# Patient Record
Sex: Male | Born: 1960
Health system: Southern US, Community
[De-identification: ages and names within clinical notes are randomized; demographics above are authoritative.]

## PROBLEM LIST (undated history)

## (undated) DIAGNOSIS — I10 Essential (primary) hypertension: Secondary | ICD-10-CM

## (undated) DIAGNOSIS — G4730 Sleep apnea, unspecified: Secondary | ICD-10-CM

## (undated) DIAGNOSIS — G709 Myoneural disorder, unspecified: Secondary | ICD-10-CM

## (undated) DIAGNOSIS — E669 Obesity, unspecified: Secondary | ICD-10-CM

## (undated) DIAGNOSIS — K5730 Diverticulosis of large intestine without perforation or abscess without bleeding: Secondary | ICD-10-CM

## (undated) DIAGNOSIS — K635 Polyp of colon: Secondary | ICD-10-CM

## (undated) DIAGNOSIS — I509 Heart failure, unspecified: Secondary | ICD-10-CM

## (undated) DIAGNOSIS — K259 Gastric ulcer, unspecified as acute or chronic, without hemorrhage or perforation: Secondary | ICD-10-CM

## (undated) DIAGNOSIS — E119 Type 2 diabetes mellitus without complications: Secondary | ICD-10-CM

## (undated) DIAGNOSIS — M1990 Unspecified osteoarthritis, unspecified site: Secondary | ICD-10-CM

## (undated) DIAGNOSIS — F988 Other specified behavioral and emotional disorders with onset usually occurring in childhood and adolescence: Secondary | ICD-10-CM

## (undated) DIAGNOSIS — E785 Hyperlipidemia, unspecified: Secondary | ICD-10-CM

## (undated) HISTORY — DX: Gastric ulcer, unspecified as acute or chronic, without hemorrhage or perforation: K25.9

## (undated) HISTORY — DX: Heart failure, unspecified: I50.9

## (undated) HISTORY — DX: Obesity, unspecified: E66.9

## (undated) HISTORY — DX: Other specified behavioral and emotional disorders with onset usually occurring in childhood and adolescence: F98.8

## (undated) HISTORY — DX: Myoneural disorder, unspecified: G70.9

## (undated) HISTORY — PX: UPPER GASTROINTESTINAL ENDOSCOPY: SHX188

## (undated) HISTORY — DX: Essential (primary) hypertension: I10

## (undated) HISTORY — DX: Hyperlipidemia, unspecified: E78.5

## (undated) HISTORY — PX: OTHER SURGICAL HISTORY: SHX169

## (undated) HISTORY — PX: COLONOSCOPY: SHX174

## (undated) HISTORY — DX: Type 2 diabetes mellitus without complications: E11.9

## (undated) HISTORY — DX: Sleep apnea, unspecified: G47.30

## (undated) HISTORY — DX: Unspecified osteoarthritis, unspecified site: M19.90

---
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
## 1989-06-10 DIAGNOSIS — K259 Gastric ulcer, unspecified as acute or chronic, without hemorrhage or perforation: Secondary | ICD-10-CM

## 1989-06-10 HISTORY — DX: Gastric ulcer, unspecified as acute or chronic, without hemorrhage or perforation: K25.9

## 1999-10-09 ENCOUNTER — Emergency Department (HOSPITAL_COMMUNITY): Admission: EM | Admit: 1999-10-09 | Discharge: 1999-10-09 | Payer: Self-pay | Admitting: Emergency Medicine

## 2002-11-24 ENCOUNTER — Ambulatory Visit (HOSPITAL_BASED_OUTPATIENT_CLINIC_OR_DEPARTMENT_OTHER): Admission: RE | Admit: 2002-11-24 | Discharge: 2002-11-24 | Payer: Self-pay | Admitting: Pulmonary Disease

## 2003-04-19 ENCOUNTER — Ambulatory Visit (HOSPITAL_COMMUNITY): Admission: RE | Admit: 2003-04-19 | Discharge: 2003-04-19 | Payer: Self-pay | Admitting: Gastroenterology

## 2004-06-20 ENCOUNTER — Ambulatory Visit: Payer: Self-pay | Admitting: Pulmonary Disease

## 2005-10-01 ENCOUNTER — Encounter: Admission: RE | Admit: 2005-10-01 | Discharge: 2005-10-01 | Payer: Self-pay | Admitting: Family Medicine

## 2005-10-01 ENCOUNTER — Ambulatory Visit: Payer: Self-pay | Admitting: Family Medicine

## 2005-10-03 ENCOUNTER — Ambulatory Visit: Payer: Self-pay | Admitting: Family Medicine

## 2005-10-07 ENCOUNTER — Ambulatory Visit: Payer: Self-pay | Admitting: Family Medicine

## 2005-12-30 ENCOUNTER — Encounter: Admission: RE | Admit: 2005-12-30 | Discharge: 2005-12-30 | Payer: Self-pay | Admitting: Ophthalmology

## 2006-01-02 ENCOUNTER — Ambulatory Visit: Payer: Self-pay | Admitting: Family Medicine

## 2006-01-09 ENCOUNTER — Ambulatory Visit: Payer: Self-pay | Admitting: Family Medicine

## 2006-01-27 ENCOUNTER — Ambulatory Visit (HOSPITAL_COMMUNITY): Admission: RE | Admit: 2006-01-27 | Discharge: 2006-01-27 | Payer: Self-pay | Admitting: Neurology

## 2006-09-29 ENCOUNTER — Ambulatory Visit: Payer: Self-pay | Admitting: Family Medicine

## 2006-10-01 ENCOUNTER — Ambulatory Visit: Payer: Self-pay | Admitting: Family Medicine

## 2006-10-06 ENCOUNTER — Ambulatory Visit: Payer: Self-pay | Admitting: Family Medicine

## 2007-01-12 ENCOUNTER — Ambulatory Visit: Payer: Self-pay | Admitting: Family Medicine

## 2007-01-12 LAB — CONVERTED CEMR LAB
AST: 25 units/L (ref 0–37)
Alkaline Phosphatase: 63 units/L (ref 39–117)
Basophils Relative: 0.4 % (ref 0.0–1.0)
Bilirubin Urine: NEGATIVE
Bilirubin, Direct: 0.1 mg/dL (ref 0.0–0.3)
CO2: 29 meq/L (ref 19–32)
Calcium: 9 mg/dL (ref 8.4–10.5)
Eosinophils Relative: 3.3 % (ref 0.0–5.0)
GFR calc non Af Amer: 96 mL/min
Glucose, Urine, Semiquant: NEGATIVE
LDL Cholesterol: 74 mg/dL (ref 0–99)
Lymphocytes Relative: 44.7 % (ref 12.0–46.0)
MCHC: 34 g/dL (ref 30.0–36.0)
MCV: 89.5 fL (ref 78.0–100.0)
Nitrite: NEGATIVE
Potassium: 3.9 meq/L (ref 3.5–5.1)
RBC: 5.24 M/uL (ref 4.22–5.81)
RDW: 14.3 % (ref 11.5–14.6)
Total Bilirubin: 0.8 mg/dL (ref 0.3–1.2)
Total Protein: 6.8 g/dL (ref 6.0–8.3)
Triglycerides: 73 mg/dL (ref 0–149)
Urobilinogen, UA: 0.2
VLDL: 15 mg/dL (ref 0–40)
pH: 5.5

## 2007-01-19 ENCOUNTER — Ambulatory Visit: Payer: Self-pay | Admitting: Family Medicine

## 2007-01-19 DIAGNOSIS — E66813 Obesity, class 3: Secondary | ICD-10-CM | POA: Insufficient documentation

## 2007-01-19 DIAGNOSIS — G35 Multiple sclerosis: Secondary | ICD-10-CM | POA: Insufficient documentation

## 2007-01-19 DIAGNOSIS — Z9189 Other specified personal risk factors, not elsewhere classified: Secondary | ICD-10-CM | POA: Insufficient documentation

## 2007-01-19 DIAGNOSIS — E669 Obesity, unspecified: Secondary | ICD-10-CM

## 2007-05-05 ENCOUNTER — Telehealth: Payer: Self-pay | Admitting: Family Medicine

## 2007-07-29 ENCOUNTER — Telehealth: Payer: Self-pay | Admitting: Family Medicine

## 2007-08-11 ENCOUNTER — Ambulatory Visit: Payer: Self-pay | Admitting: Family Medicine

## 2007-08-11 DIAGNOSIS — F988 Other specified behavioral and emotional disorders with onset usually occurring in childhood and adolescence: Secondary | ICD-10-CM | POA: Insufficient documentation

## 2007-08-11 DIAGNOSIS — IMO0002 Reserved for concepts with insufficient information to code with codable children: Secondary | ICD-10-CM

## 2007-08-11 LAB — CONVERTED CEMR LAB
Chloride: 105 meq/L (ref 96–112)
Creatinine, Ser: 1.1 mg/dL (ref 0.4–1.5)
GFR calc Af Amer: 92 mL/min
GFR calc non Af Amer: 76 mL/min
Glucose, Bld: 131 mg/dL — ABNORMAL HIGH (ref 70–99)
Microalb Creat Ratio: 12.2 mg/g (ref 0.0–30.0)
Potassium: 4 meq/L (ref 3.5–5.1)

## 2007-08-17 ENCOUNTER — Telehealth: Payer: Self-pay | Admitting: Family Medicine

## 2007-11-12 ENCOUNTER — Ambulatory Visit: Payer: Self-pay | Admitting: Family Medicine

## 2008-02-12 ENCOUNTER — Ambulatory Visit: Payer: Self-pay | Admitting: Family Medicine

## 2008-08-22 ENCOUNTER — Ambulatory Visit: Payer: Self-pay | Admitting: Family Medicine

## 2008-08-26 ENCOUNTER — Telehealth: Payer: Self-pay | Admitting: *Deleted

## 2008-08-26 LAB — CONVERTED CEMR LAB
Creatinine, Ser: 0.9 mg/dL (ref 0.4–1.5)
GFR calc non Af Amer: 95 mL/min
Glucose, Bld: 148 mg/dL — ABNORMAL HIGH (ref 70–99)
Sodium: 141 meq/L (ref 135–145)

## 2008-11-28 ENCOUNTER — Ambulatory Visit: Payer: Self-pay | Admitting: Family Medicine

## 2008-11-28 LAB — CONVERTED CEMR LAB
Blood Glucose, Fasting: 133 mg/dL
Hgb A1c MFr Bld: 6.7 % — ABNORMAL HIGH (ref 4.6–6.5)

## 2008-12-05 ENCOUNTER — Ambulatory Visit: Payer: Self-pay | Admitting: Family Medicine

## 2008-12-05 DIAGNOSIS — N529 Male erectile dysfunction, unspecified: Secondary | ICD-10-CM | POA: Insufficient documentation

## 2008-12-07 LAB — CONVERTED CEMR LAB: Testosterone: 355.72 ng/dL (ref 350.00–890.00)

## 2008-12-26 ENCOUNTER — Telehealth: Payer: Self-pay | Admitting: Family Medicine

## 2009-02-08 ENCOUNTER — Ambulatory Visit: Payer: Self-pay | Admitting: Family Medicine

## 2009-02-08 LAB — CONVERTED CEMR LAB
AST: 27 units/L (ref 0–37)
Alkaline Phosphatase: 61 units/L (ref 39–117)
Basophils Absolute: 0 10*3/uL (ref 0.0–0.1)
Bilirubin, Direct: 0.1 mg/dL (ref 0.0–0.3)
Blood in Urine, dipstick: NEGATIVE
CO2: 32 meq/L (ref 19–32)
Eosinophils Relative: 2.6 % (ref 0.0–5.0)
Glucose, Bld: 127 mg/dL — ABNORMAL HIGH (ref 70–99)
Glucose, Urine, Semiquant: NEGATIVE
HDL: 38.9 mg/dL — ABNORMAL LOW (ref >39.00)
Hemoglobin: 16.3 g/dL (ref 13.0–17.0)
Ketones, urine, test strip: NEGATIVE
Lymphocytes Relative: 41 % (ref 12.0–46.0)
MCV: 91.1 fL (ref 78.0–100.0)
Monocytes Absolute: 0.6 10*3/uL (ref 0.1–1.0)
Neutro Abs: 3.1 10*3/uL (ref 1.4–7.7)
Neutrophils Relative %: 46.9 % (ref 43.0–77.0)
Potassium: 4.1 meq/L (ref 3.5–5.1)
RDW: 14 % (ref 11.5–14.6)
Sodium: 142 meq/L (ref 135–145)
Specific Gravity, Urine: 1.025
TSH: 0.82 microintl units/mL (ref 0.35–5.50)
Total Bilirubin: 0.8 mg/dL (ref 0.3–1.2)
Total CHOL/HDL Ratio: 3
Total Protein: 7.3 g/dL (ref 6.0–8.3)
Triglycerides: 56 mg/dL (ref 0.0–149.0)
Urobilinogen, UA: 1
VLDL: 11.2 mg/dL (ref 0.0–40.0)

## 2009-02-21 ENCOUNTER — Ambulatory Visit: Payer: Self-pay | Admitting: Family Medicine

## 2009-03-03 ENCOUNTER — Telehealth: Payer: Self-pay | Admitting: Family Medicine

## 2009-05-24 ENCOUNTER — Ambulatory Visit: Payer: Self-pay | Admitting: Family Medicine

## 2009-08-15 ENCOUNTER — Ambulatory Visit: Payer: Self-pay | Admitting: Family Medicine

## 2009-08-15 LAB — CONVERTED CEMR LAB
Calcium: 9.1 mg/dL (ref 8.4–10.5)
Chloride: 106 meq/L (ref 96–112)
Creatinine, Ser: 1.1 mg/dL (ref 0.4–1.5)
Glucose, Bld: 123 mg/dL — ABNORMAL HIGH (ref 70–99)
Potassium: 4 meq/L (ref 3.5–5.1)

## 2009-08-22 ENCOUNTER — Ambulatory Visit: Payer: Self-pay | Admitting: Family Medicine

## 2009-12-01 ENCOUNTER — Ambulatory Visit: Payer: Self-pay | Admitting: Family Medicine

## 2009-12-01 DIAGNOSIS — G8929 Other chronic pain: Secondary | ICD-10-CM | POA: Insufficient documentation

## 2009-12-01 DIAGNOSIS — M25552 Pain in left hip: Secondary | ICD-10-CM | POA: Insufficient documentation

## 2009-12-01 DIAGNOSIS — M25559 Pain in unspecified hip: Secondary | ICD-10-CM | POA: Insufficient documentation

## 2009-12-03 ENCOUNTER — Ambulatory Visit (HOSPITAL_COMMUNITY): Admission: RE | Admit: 2009-12-03 | Discharge: 2009-12-03 | Payer: Self-pay | Admitting: Family Medicine

## 2009-12-25 ENCOUNTER — Telehealth: Payer: Self-pay | Admitting: Family Medicine

## 2010-02-13 ENCOUNTER — Ambulatory Visit: Payer: Self-pay | Admitting: Family Medicine

## 2010-02-13 LAB — CONVERTED CEMR LAB
ALT: 24 units/L (ref 0–53)
Albumin: 4 g/dL (ref 3.5–5.2)
BUN: 15 mg/dL (ref 6–23)
Bilirubin, Direct: 0.1 mg/dL (ref 0.0–0.3)
CO2: 31 meq/L (ref 19–32)
Calcium: 9.1 mg/dL (ref 8.4–10.5)
Cholesterol: 131 mg/dL (ref 0–200)
Creatinine,U: 193 mg/dL
GFR calc non Af Amer: 108.98 mL/min (ref >60)
HCT: 46.9 % (ref 39.0–52.0)
HDL: 42.9 mg/dL (ref >39.00)
Hemoglobin: 16 g/dL (ref 13.0–17.0)
Hgb A1c MFr Bld: 6.7 % — ABNORMAL HIGH (ref 4.6–6.5)
Lymphs Abs: 2.6 10*3/uL (ref 0.7–4.0)
MCHC: 34 g/dL (ref 30.0–36.0)
Microalb Creat Ratio: 2.7 mg/g (ref 0.0–30.0)
Microalb, Ur: 5.2 mg/dL — ABNORMAL HIGH (ref 0.0–1.9)
Monocytes Absolute: 0.5 10*3/uL (ref 0.1–1.0)
Neutro Abs: 3.2 10*3/uL (ref 1.4–7.7)
Platelets: 201 10*3/uL (ref 150.0–400.0)
RBC: 5.16 M/uL (ref 4.22–5.81)
Total CHOL/HDL Ratio: 3
Total Protein: 7 g/dL (ref 6.0–8.3)
Triglycerides: 59 mg/dL (ref 0.0–149.0)
VLDL: 11.8 mg/dL (ref 0.0–40.0)

## 2010-02-20 ENCOUNTER — Ambulatory Visit: Payer: Self-pay | Admitting: Family Medicine

## 2010-02-20 DIAGNOSIS — K921 Melena: Secondary | ICD-10-CM

## 2010-05-11 ENCOUNTER — Telehealth: Payer: Self-pay | Admitting: Family Medicine

## 2010-05-24 ENCOUNTER — Telehealth: Payer: Self-pay | Admitting: Family Medicine

## 2010-06-08 ENCOUNTER — Encounter: Payer: Self-pay | Admitting: Family Medicine

## 2010-07-08 LAB — CONVERTED CEMR LAB
ALT: 25 units/L (ref 0–53)
Albumin: 3.9 g/dL (ref 3.5–5.2)
Alkaline Phosphatase: 59 units/L (ref 39–117)
Basophils Absolute: 0 10*3/uL (ref 0.0–0.1)
Basophils Relative: 0.9 % (ref 0.0–3.0)
Chloride: 104 meq/L (ref 96–112)
Creatinine, Ser: 1 mg/dL (ref 0.4–1.5)
Creatinine,U: 233.7 mg/dL
Creatinine,U: 282.1 mg/dL
Eosinophils Absolute: 0.1 10*3/uL (ref 0.0–0.7)
Glucose, Bld: 128 mg/dL — ABNORMAL HIGH (ref 70–99)
HDL: 37.8 mg/dL — ABNORMAL LOW (ref >39.0)
Hgb A1c MFr Bld: 6.5 % (ref 4.6–6.5)
Ketones, urine, test strip: NEGATIVE
Lymphocytes Relative: 38.1 % (ref 12.0–46.0)
MCHC: 34.8 g/dL (ref 30.0–36.0)
Microalb Creat Ratio: 5.7 mg/g (ref 0.0–30.0)
Microalb Creat Ratio: 8.6 mg/g (ref 0.0–30.0)
Monocytes Absolute: 0.4 10*3/uL (ref 0.1–1.0)
Monocytes Relative: 8.3 % (ref 3.0–12.0)
Nitrite: NEGATIVE
PSA: 0.7 ng/mL (ref 0.10–4.00)
Potassium: 4 meq/L (ref 3.5–5.1)
Sodium: 140 meq/L (ref 135–145)
TSH: 0.75 microintl units/mL (ref 0.35–5.50)
Total Bilirubin: 1 mg/dL (ref 0.3–1.2)
Total Protein: 7.3 g/dL (ref 6.0–8.3)
Triglycerides: 57 mg/dL (ref 0–149)
Urobilinogen, UA: 1
VLDL: 11 mg/dL (ref 0–40)
WBC: 5.4 10*3/uL (ref 4.5–10.5)
pH: 7
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
## 2010-07-12 NOTE — Letter (Signed)
Summary: Referral - not able to see patient  [facility]  [address] [place], [place] [address]   Phone: [phone]  Fax: [phone]    June 08, 2010    Tinnie Gens A. Tawanna Cooler, M.D. [address] [place], [place] [address]   Re:   AMARE BAIL DOB:  April 11, 1961 MRN:   [id number]    Dear Dr. Tawanna Cooler:  Thank you for your kind referral of the above patient.  We have attempted to schedule the recommended procedure Screening Colonoscopy but have not been able to schedule because:   X  The patient was not available by phone and/or has not returned our calls.  ___ The patient declined to schedule the procedure at this time.  We appreciate the referral and hope that we will have the opportunity to treat this patient in the future.    Sincerely,    [facility] [phone]
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
## 2010-07-12 NOTE — Progress Notes (Signed)
Summary: Adderall request  Phone Note Call from Patient Call back at [phone] [facility] cell   Caller: Spouse - [name] Call For: Brent Pee MD Summary of Call: VM from wife requesting refill Adderall 25mg  XR.  Left a message for pt he does not have this med on his active med list.  Ritalin 20mg  is.  Requested he call back for clarification of request.  Message included Dr Tawanna Cooler out of office until Monday 12/7.  Ritalin last filled 9/13/1, number 200, pt taking two times a day  Initial call taken by: Sid Falcon LPN,  May 11, 2010 2:58 PM  Follow-up for Phone Call        Pt called back, states Dr Tawanna Cooler told him he could try an extended release at any time, would have to chg to Adderall.  Pt is going to call his pharmacy to see if they can fill Adderall, available has been a problem, he will call back Monday Follow-up by: Sid Falcon LPN,  May 11, 2010 3:32 PM  Additional Follow-up for Phone Call Additional follow up Details #1::        Adderall 25 mg long-acting, dispense 30, directions one q.a.m., refills x 2 Additional Follow-up by: Brent Pee MD,  May 14, 2010 8:49 AM    Additional Follow-up for Phone Call Additional follow up Details #2::    Pt is req an XR for Ritalin or Adderall. Pts spouse would like Fleet Contras to call back to discuss.  Follow-up by: Lucy Antigua,  May 15, 2010 1:25 PM  New/Updated Medications: ADDERALL XR 25 MG XR24H-CAP (AMPHETAMINE-DEXTROAMPHETAMINE) take one cap  once daily   fill now ADDERALL XR 25 MG XR24H-CAP (AMPHETAMINE-DEXTROAMPHETAMINE) take one cap once daily   fill in one month ADDERALL XR 25 MG XR24H-CAP (AMPHETAMINE-DEXTROAMPHETAMINE) take one cap once daily   fill in two months Prescriptions: ADDERALL XR 25 MG XR24H-CAP (AMPHETAMINE-DEXTROAMPHETAMINE) take one cap once daily   fill in two months  #30 x 0   Entered by:   Kern Reap CMA (AAMA)   Authorized by:   Brent Pee MD   Signed by:   Kern Reap  CMA (AAMA) on 05/15/2010   Method used:   Print then Give to Patient   RxID:   [id number] ADDERALL XR 25 MG XR24H-CAP (AMPHETAMINE-DEXTROAMPHETAMINE) take one cap once daily   fill in one month  #30 x 0   Entered by:   Kern Reap CMA (AAMA)   Authorized by:   Brent Pee MD   Signed by:   Kern Reap CMA (AAMA) on 05/15/2010   Method used:   Print then Give to Patient   RxID:   [id number] ADDERALL XR 25 MG XR24H-CAP (AMPHETAMINE-DEXTROAMPHETAMINE) take one cap  once daily   fill now  #30 x 0   Entered by:   Kern Reap CMA (AAMA)   Authorized by:   Brent Pee MD   Signed by:   Kern Reap CMA (AAMA) on 05/15/2010   Method used:   Print then Give to Patient   RxID:   [id number]

## 2010-07-12 NOTE — Progress Notes (Signed)
Summary: Pt req script for Ritalin  Phone Note Refill Request Call back at Work Phone [phone] Message from:  Patient on December 25, 2009 11:11 AM  Refills Requested: Medication #1:  RITALIN 20 MG  TABS Take 1 tablet by mouth two times a day   Dosage confirmed as above?Dosage Confirmed   Supply Requested: 3 months  Method Requested: Pick up at Office Initial call taken by: Lucy Antigua,  December 25, 2009 11:11 AM  Follow-up for Phone Call        rx ready to pick up Follow-up by: Kern Reap CMA Duncan Dull),  December 26, 2009 11:13 AM    Prescriptions: RITALIN 20 MG  TABS (METHYLPHENIDATE HCL) Take 1 tablet by mouth two times a day  #200 x 0   Entered by:   Kern Reap CMA (AAMA)   Authorized by:   Roderick Pee MD   Signed by:   Kern Reap CMA (AAMA) on 12/25/2009   Method used:   Print then Give to Patient   RxID:   [id number]

## 2010-08-14 ENCOUNTER — Other Ambulatory Visit (INDEPENDENT_AMBULATORY_CARE_PROVIDER_SITE_OTHER): Payer: 59

## 2010-08-14 DIAGNOSIS — E119 Type 2 diabetes mellitus without complications: Secondary | ICD-10-CM

## 2010-08-14 DIAGNOSIS — I10 Essential (primary) hypertension: Secondary | ICD-10-CM

## 2010-08-14 LAB — BASIC METABOLIC PANEL
CO2: 29 mEq/L (ref 19–32)
Calcium: 9.4 mg/dL (ref 8.4–10.5)
Chloride: 104 mEq/L (ref 96–112)
Glucose, Bld: 117 mg/dL — ABNORMAL HIGH (ref 70–99)
Potassium: 4.3 mEq/L (ref 3.5–5.1)
Sodium: 142 mEq/L (ref 135–145)

## 2010-08-14 LAB — HEMOGLOBIN A1C: Hgb A1c MFr Bld: 6.6 % — ABNORMAL HIGH (ref 4.6–6.5)

## 2010-08-21 ENCOUNTER — Encounter: Payer: Self-pay | Admitting: Family Medicine

## 2010-08-21 ENCOUNTER — Other Ambulatory Visit: Payer: Self-pay | Admitting: Family Medicine

## 2010-08-21 ENCOUNTER — Ambulatory Visit (INDEPENDENT_AMBULATORY_CARE_PROVIDER_SITE_OTHER): Payer: 59 | Admitting: Family Medicine

## 2010-08-21 DIAGNOSIS — E669 Obesity, unspecified: Secondary | ICD-10-CM

## 2010-08-21 DIAGNOSIS — E119 Type 2 diabetes mellitus without complications: Secondary | ICD-10-CM

## 2010-08-21 DIAGNOSIS — F988 Other specified behavioral and emotional disorders with onset usually occurring in childhood and adolescence: Secondary | ICD-10-CM

## 2010-08-21 DIAGNOSIS — Z1211 Encounter for screening for malignant neoplasm of colon: Secondary | ICD-10-CM

## 2010-08-21 DIAGNOSIS — IMO0002 Reserved for concepts with insufficient information to code with codable children: Secondary | ICD-10-CM

## 2010-08-21 DIAGNOSIS — K625 Hemorrhage of anus and rectum: Secondary | ICD-10-CM

## 2010-08-21 MED ORDER — AMPHETAMINE-DEXTROAMPHET ER 30 MG PO CP24: 30.0000 mg | ORAL_CAPSULE | ORAL | Status: DC

## 2010-08-21 NOTE — Patient Instructions (Addendum)
Continue current medication except increase the Adderall to 30 mg daily.  Follow-up in September for your annual exam.  Remember labs one week prior.  Call the orthopedist for evaluation of your left elbow  At the end of the visit.  He told me he never went to GI for colonoscopy.  He was having recurrent rectal bleeding.  Advised to go back to GI ASAP for evaluation.  Request put in

## 2010-08-21 NOTE — Progress Notes (Signed)
  Subjective:    Patient ID: Brent Wallace, male    DOB: 18-Feb-1961, 51 y.o.   MRN: [id number]  HPI Brent Wallace is a 50 year old male, who comes in today for multiple issues.  He is a history of underlying diabetes, for which he takes metformin 500 mg b.i.d., blood sugar 117.  A1c6 .6%.  His history of obesity.  He's lost 20 pounds in the past 6 months.  He is down to 332 pounds.  His history of ADHD for which he takes Adderall long-acting 25 mg daily.  He, thinks it's been less effective would like to increase the dose.  He said pain and stiffness in his left arm for 3 months.  No history of trauma referred or so   Review of Systems    Other than above, negative Objective:   Physical Exam Well-developed well-nourished, male in no acute distress.  Examination of the left arm shows a 10 degree contracture left elbow       Assessment & Plan:  Diabetes under good control continue above therapy.  Obesity.  Continue weight loss.  ADHD.  Increase Adderall to 30 mg long-acting.  A stiffness, left elbow referred to or for

## 2010-11-06 ENCOUNTER — Encounter: Payer: Self-pay | Admitting: Gastroenterology

## 2011-01-02 ENCOUNTER — Telehealth: Payer: Self-pay | Admitting: Family Medicine

## 2011-01-02 NOTE — Telephone Encounter (Signed)
Refill adderall xr  

## 2011-01-03 MED ORDER — AMPHETAMINE-DEXTROAMPHET ER 25 MG PO CP24: 25.0000 mg | ORAL_CAPSULE | ORAL | Status: DC

## 2011-02-20 ENCOUNTER — Other Ambulatory Visit (INDEPENDENT_AMBULATORY_CARE_PROVIDER_SITE_OTHER): Payer: Commercial Managed Care - PPO

## 2011-02-20 DIAGNOSIS — E119 Type 2 diabetes mellitus without complications: Secondary | ICD-10-CM

## 2011-02-20 DIAGNOSIS — F988 Other specified behavioral and emotional disorders with onset usually occurring in childhood and adolescence: Secondary | ICD-10-CM

## 2011-02-20 DIAGNOSIS — E669 Obesity, unspecified: Secondary | ICD-10-CM

## 2011-02-20 DIAGNOSIS — IMO0002 Reserved for concepts with insufficient information to code with codable children: Secondary | ICD-10-CM

## 2011-02-20 LAB — CBC WITH DIFFERENTIAL/PLATELET
Basophils Relative: 0.9 % (ref 0.0–3.0)
Eosinophils Absolute: 0.3 10*3/uL (ref 0.0–0.7)
Lymphocytes Relative: 39.6 % (ref 12.0–46.0)
MCHC: 32.9 g/dL (ref 30.0–36.0)
Neutrophils Relative %: 46.4 % (ref 43.0–77.0)
RBC: 5.27 Mil/uL (ref 4.22–5.81)
WBC: 7.5 10*3/uL (ref 4.5–10.5)

## 2011-02-20 LAB — LIPID PANEL
HDL: 47.9 mg/dL (ref >39.00)
LDL Cholesterol: 74 mg/dL (ref 0–99)
Total CHOL/HDL Ratio: 3
VLDL: 11.4 mg/dL (ref 0.0–40.0)

## 2011-02-20 LAB — POCT URINALYSIS DIPSTICK
Bilirubin, UA: NEGATIVE
Leukocytes, UA: NEGATIVE
Nitrite, UA: NEGATIVE
Urobilinogen, UA: 2
pH, UA: 5.5

## 2011-02-20 LAB — HEPATIC FUNCTION PANEL
Alkaline Phosphatase: 76 U/L (ref 39–117)
Bilirubin, Direct: 0.1 mg/dL (ref 0.0–0.3)
Total Protein: 6.8 g/dL (ref 6.0–8.3)

## 2011-02-20 LAB — BASIC METABOLIC PANEL
Calcium: 9 mg/dL (ref 8.4–10.5)
Creatinine, Ser: 0.9 mg/dL (ref 0.4–1.5)

## 2011-02-20 LAB — HEMOGLOBIN A1C: Hgb A1c MFr Bld: 6.4 % (ref 4.6–6.5)

## 2011-02-27 ENCOUNTER — Encounter: Payer: Self-pay | Admitting: Gastroenterology

## 2011-02-27 ENCOUNTER — Ambulatory Visit (INDEPENDENT_AMBULATORY_CARE_PROVIDER_SITE_OTHER): Payer: Commercial Managed Care - PPO | Admitting: Family Medicine

## 2011-02-27 ENCOUNTER — Encounter: Payer: Self-pay | Admitting: Family Medicine

## 2011-02-27 DIAGNOSIS — E669 Obesity, unspecified: Secondary | ICD-10-CM

## 2011-02-27 DIAGNOSIS — E119 Type 2 diabetes mellitus without complications: Secondary | ICD-10-CM

## 2011-02-27 DIAGNOSIS — N529 Male erectile dysfunction, unspecified: Secondary | ICD-10-CM

## 2011-02-27 DIAGNOSIS — Z23 Encounter for immunization: Secondary | ICD-10-CM

## 2011-02-27 DIAGNOSIS — F988 Other specified behavioral and emotional disorders with onset usually occurring in childhood and adolescence: Secondary | ICD-10-CM

## 2011-02-27 DIAGNOSIS — IMO0002 Reserved for concepts with insufficient information to code with codable children: Secondary | ICD-10-CM

## 2011-02-27 DIAGNOSIS — Z0000 Encounter for general adult medical examination without abnormal findings: Secondary | ICD-10-CM

## 2011-02-27 DIAGNOSIS — K921 Melena: Secondary | ICD-10-CM

## 2011-02-27 DIAGNOSIS — G35 Multiple sclerosis: Secondary | ICD-10-CM

## 2011-02-27 MED ORDER — LISINOPRIL-HYDROCHLOROTHIAZIDE 10-12.5 MG PO TABS: 1.0000 | ORAL_TABLET | Freq: Every day | ORAL | Status: DC

## 2011-02-27 MED ORDER — SILDENAFIL CITRATE 50 MG PO TABS: 50.0000 mg | ORAL_TABLET | ORAL | Status: DC

## 2011-02-27 MED ORDER — METFORMIN HCL 500 MG PO TABS: 500.0000 mg | ORAL_TABLET | Freq: Two times a day (BID) | ORAL | Status: DC

## 2011-02-27 NOTE — Progress Notes (Signed)
Subjective:    Patient ID: Brent Wallace, male    DOB: 07-18-1960, 50 y.o.   MRN: [id number]  Hypertension  Diabetes   Brent Wallace Is a 50 year old, married male, nonsmoker....except 3 weeks......... comes in today for annual physical examination because of a history of adult ADD, hypertension, diabetes, erectile dysfunction, blood in his stools, mild MS, and a new problem of late cramps.  He takes long-acting Adderall 25 mg daily for adult ADD.  He takes Zestoretic 10 Desyrel .5 daily for hypertension.  Has not taken his medication in two days.  When he takes a daily his blood pressures at goal 130/80 or less.  He takes metformin 500 mg b.i.d. Fasting blood sugar 110.  A1c6 .4%.  He continues to struggle with his weight is over 350 pounds. ree weeks ago.  He quit smoking completely.hT He has erectile dysfunction, which we treat with fiber 50 mg p.r.n.  He had a history of blood in the stools.  We refer him to GI.  His mother got sick and he never went.  Again, will refer back to GI.  He began having right and left upper thigh cramping at the end of the day.  His wife is a PA and sent in to see an orthopedist.  They did an evaluation, which occluded.  An MRI.  They told him he had some bulging disks.  Put him on Cymbalta 60 mg a day indomethacin 50 mg b.i.d. And his discomfort.  Has gone away.  I question if it is indeed a disk problem or if some neuropathy from his diabetes.  The way describes it sounds like more of her neuropathy.  He gets routine eye care, dental care, colonoscopy is due as noted above.  Tetanus 2007 seasonal flu shot today   Review of Systems  Constitutional: Negative.   HENT: Negative.   Eyes: Negative.   Respiratory: Negative.   Cardiovascular: Negative.   Gastrointestinal: Negative.   Genitourinary: Negative.   Musculoskeletal: Negative.   Skin: Negative.   Neurological: Negative.   Hematological: Negative.   Psychiatric/Behavioral: Negative.          Objective:   Physical Exam  Constitutional: He is oriented to person, place, and time. He appears well-developed and well-nourished.       obese  HENT:  Head: Normocephalic and atraumatic.  Right Ear: External ear normal.  Left Ear: External ear normal.  Nose: Nose normal.  Mouth/Throat: Oropharynx is clear and moist.  Eyes: Conjunctivae and EOM are normal. Pupils are equal, round, and reactive to light.  Neck: Normal range of motion. Neck supple. No JVD present. No tracheal deviation present. No thyromegaly present.  Cardiovascular: Normal rate, regular rhythm, normal heart sounds and intact distal pulses.  Exam reveals no gallop and no friction rub.   No murmur heard. Pulmonary/Chest: Effort normal and breath sounds normal. No stridor. No respiratory distress. He has no wheezes. He has no rales. He exhibits no tenderness.  Abdominal: Soft. Bowel sounds are normal. He exhibits no distension and no mass. There is no tenderness. There is no rebound and no guarding.  Genitourinary: Rectum normal, prostate normal and penis normal. Guaiac negative stool. No penile tenderness.  Musculoskeletal: Normal range of motion. He exhibits no edema and no tenderness.  Lymphadenopathy:    He has no cervical adenopathy.  Neurological: He is alert and oriented to person, place, and time. He has normal reflexes. No cranial nerve deficit. He exhibits normal muscle tone.  Skin: Skin is  warm and dry. No rash noted. No erythema. No pallor.  Psychiatric: He has a normal mood and affect. His behavior is normal. Judgment and thought content normal.          Assessment & Plan:  Diabetes type 2, under good control continue metformin 500 mg b.i.d., add a baby aspirin.  ADD continue Adderall 25 mg long-acting daily.  Hypertension.  Continue lisinopr  10.  Desyrel .5 daily.  Erectile dysfunction continue Viagra 50 mg daily.  Next smoker x 3 weeks encouraged not to start again.  History of right and left  upper thigh pain, probable diabetic neuropathy.  Continue Cymbalta 60 mg daily.  History of intermittent bright red direct of bleeding guaiac-negative.  Today.  However, I still think he needs a colonoscopy.  We set him up for one and he never went.  He says this time.  He will get it done.  Obesity.  Again encouraged on exercise and weight loss.  Follow-up for his diabetes in 6 months

## 2011-02-27 NOTE — Patient Instructions (Signed)
Continue your current medications  Continue to avoid nicotine.  Add a baby aspirin daily.  Somebody will call you from GI to get to set up for the colonoscopy.  Follow-up in 6 months fasting labs one week prior.  Remember to walk 30 minutes daily.

## 2011-03-12 ENCOUNTER — Other Ambulatory Visit: Payer: Commercial Managed Care - PPO | Admitting: Gastroenterology

## 2011-03-18 ENCOUNTER — Ambulatory Visit (AMBULATORY_SURGERY_CENTER): Payer: Commercial Managed Care - PPO | Admitting: *Deleted

## 2011-03-18 ENCOUNTER — Encounter: Payer: Self-pay | Admitting: Gastroenterology

## 2011-03-18 VITALS — Ht 69.0 in | Wt 261.0 lb

## 2011-03-18 DIAGNOSIS — Z1211 Encounter for screening for malignant neoplasm of colon: Secondary | ICD-10-CM

## 2011-03-18 NOTE — Progress Notes (Signed)
Pt. Scheduled for NP office visit with Dr Christella Hartigan 04/16/11 due to weight limit.  He's 361 lb. And needs to be done at [facility] most likely under propofol.  Pt. Will bring a list of dates he'll be able to have transportation.  He's never been seen by our practice and is a diabetic, on Ritalin, Adderall,  Cymbalta, and BP med.   He was here for a colon screening and has never been seen by our practice.   Wyona Almas

## 2011-04-01 ENCOUNTER — Other Ambulatory Visit: Payer: Commercial Managed Care - PPO | Admitting: Gastroenterology

## 2011-04-16 ENCOUNTER — Encounter: Payer: Self-pay | Admitting: Gastroenterology

## 2011-04-16 ENCOUNTER — Ambulatory Visit (INDEPENDENT_AMBULATORY_CARE_PROVIDER_SITE_OTHER): Payer: Commercial Managed Care - PPO | Admitting: Gastroenterology

## 2011-04-16 VITALS — BP 136/74 | HR 88 | Ht 70.0 in | Wt 364.0 lb

## 2011-04-16 DIAGNOSIS — Z1211 Encounter for screening for malignant neoplasm of colon: Secondary | ICD-10-CM

## 2011-04-16 DIAGNOSIS — E669 Obesity, unspecified: Secondary | ICD-10-CM

## 2011-04-16 NOTE — Patient Instructions (Signed)
You will be set up for a colonoscopy at [facility] with propofol for sedation. We will set up dietician referral to help with general weight loss strategies.
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
## 2011-05-07 ENCOUNTER — Telehealth: Payer: Self-pay | Admitting: *Deleted

## 2011-05-07 NOTE — Telephone Encounter (Signed)
Called pt to ask if he could switch his times on 05/23/11 procedure. Pt needs to change the appt anyway, he's texting his wife for the dates.

## 2011-05-07 NOTE — Telephone Encounter (Signed)
Pt called back to r/s his [facility] appt, but everyday he gave me was not a Thursday. Pt will check with his wife about a January appt.

## 2011-05-11 DIAGNOSIS — K635 Polyp of colon: Secondary | ICD-10-CM

## 2011-05-11 DIAGNOSIS — K5730 Diverticulosis of large intestine without perforation or abscess without bleeding: Secondary | ICD-10-CM

## 2011-05-11 HISTORY — DX: Polyp of colon: K63.5

## 2011-05-11 HISTORY — DX: Diverticulosis of large intestine without perforation or abscess without bleeding: K57.30

## 2011-05-14 ENCOUNTER — Telehealth: Payer: Self-pay | Admitting: Family Medicine

## 2011-05-14 NOTE — Telephone Encounter (Signed)
Refill Adderall. Thanks. °

## 2011-05-15 MED ORDER — AMPHETAMINE-DEXTROAMPHET ER 25 MG PO CP24: 25.0000 mg | ORAL_CAPSULE | ORAL | Status: DC

## 2011-05-15 NOTE — Telephone Encounter (Signed)
rx ready for pick up and patient is aware  

## 2011-05-21 ENCOUNTER — Other Ambulatory Visit: Payer: Self-pay

## 2011-05-21 MED ORDER — PEG-KCL-NACL-NASULF-NA ASC-C 100 G PO SOLR: 1.0000 | ORAL | Status: DC

## 2011-05-22 ENCOUNTER — Encounter (HOSPITAL_COMMUNITY): Payer: Self-pay | Admitting: *Deleted

## 2011-05-23 ENCOUNTER — Encounter (HOSPITAL_COMMUNITY): Payer: Self-pay | Admitting: Anesthesiology

## 2011-05-23 ENCOUNTER — Encounter (HOSPITAL_COMMUNITY)
Admission: RE | Disposition: A | Discharge status: Home / Self Care | Payer: Self-pay | Source: Ambulatory Visit | Attending: Gastroenterology

## 2011-05-23 ENCOUNTER — Other Ambulatory Visit: Payer: Self-pay | Admitting: Gastroenterology

## 2011-05-23 ENCOUNTER — Ambulatory Visit (HOSPITAL_COMMUNITY): Payer: Commercial Managed Care - PPO | Admitting: Anesthesiology

## 2011-05-23 ENCOUNTER — Encounter (HOSPITAL_COMMUNITY): Payer: Self-pay | Admitting: *Deleted

## 2011-05-23 ENCOUNTER — Ambulatory Visit (HOSPITAL_COMMUNITY)
Admission: RE | Admit: 2011-05-23 | Discharge: 2011-05-23 | Disposition: A | Discharge status: Home / Self Care | Payer: Commercial Managed Care - PPO | Source: Ambulatory Visit | Attending: Gastroenterology | Admitting: Gastroenterology

## 2011-05-23 DIAGNOSIS — G4730 Sleep apnea, unspecified: Secondary | ICD-10-CM | POA: Insufficient documentation

## 2011-05-23 DIAGNOSIS — I10 Essential (primary) hypertension: Secondary | ICD-10-CM | POA: Insufficient documentation

## 2011-05-23 DIAGNOSIS — D126 Benign neoplasm of colon, unspecified: Secondary | ICD-10-CM | POA: Insufficient documentation

## 2011-05-23 DIAGNOSIS — Z1211 Encounter for screening for malignant neoplasm of colon: Secondary | ICD-10-CM

## 2011-05-23 DIAGNOSIS — E119 Type 2 diabetes mellitus without complications: Secondary | ICD-10-CM | POA: Insufficient documentation

## 2011-05-23 DIAGNOSIS — K5730 Diverticulosis of large intestine without perforation or abscess without bleeding: Secondary | ICD-10-CM

## 2011-05-23 HISTORY — PX: COLONOSCOPY: SHX5424

## 2011-05-23 LAB — GLUCOSE, CAPILLARY: Glucose-Capillary: 113 mg/dL — ABNORMAL HIGH (ref 70–99)

## 2011-05-23 SURGERY — COLONOSCOPY
Anesthesia: Monitor Anesthesia Care

## 2011-05-23 MED ORDER — PROMETHAZINE HCL 25 MG/ML IJ SOLN: 6.2500 mg | INTRAMUSCULAR | Status: DC | PRN

## 2011-05-23 MED ORDER — LIDOCAINE HCL (CARDIAC) 20 MG/ML IV SOLN
INTRAVENOUS | Status: DC | PRN
  Administered 2011-05-23: 50 mg via INTRAVENOUS

## 2011-05-23 MED ORDER — FENTANYL CITRATE 0.05 MG/ML IJ SOLN: 25.0000 ug | INTRAMUSCULAR | Status: DC | PRN

## 2011-05-23 MED ORDER — KETAMINE HCL 50 MG/ML IJ SOLN
INTRAMUSCULAR | Status: DC | PRN
  Administered 2011-05-23: 50 mg via INTRAMUSCULAR

## 2011-05-23 MED ORDER — SODIUM CHLORIDE 0.9 % IV SOLN
INTRAVENOUS | Status: DC
  Administered 2011-05-23: 1000 mL via INTRAVENOUS

## 2011-05-23 MED ORDER — MIDAZOLAM HCL 5 MG/5ML IJ SOLN
INTRAMUSCULAR | Status: DC | PRN
  Administered 2011-05-23: 1 mg via INTRAVENOUS
  Administered 2011-05-23 (×2): 0.5 mg via INTRAVENOUS

## 2011-05-23 MED ORDER — PROPOFOL 10 MG/ML IV EMUL
INTRAVENOUS | Status: DC | PRN
  Administered 2011-05-23: 50 ug/kg/min via INTRAVENOUS

## 2011-05-23 MED ORDER — FENTANYL CITRATE 0.05 MG/ML IJ SOLN
INTRAMUSCULAR | Status: DC | PRN
  Administered 2011-05-23 (×4): 25 ug via INTRAVENOUS

## 2011-05-23 NOTE — Anesthesia Preprocedure Evaluation (Addendum)
Anesthesia Evaluation  Patient identified by MRN, date of birth, ID band Patient awake    Reviewed: Allergy & Precautions, H&P , NPO status , Patient's Chart, lab work & pertinent test results  Airway Mallampati: III TM Distance: >3 FB Neck ROM: full    Dental No notable dental hx. (+) Teeth Intact and Dental Advisory Given   Pulmonary neg pulmonary ROS, sleep apnea and Continuous Positive Airway Pressure Ventilation ,  clear to auscultation  Pulmonary exam normal       Cardiovascular Exercise Tolerance: Good hypertension, On Medications neg cardio ROS regular Normal    Neuro/Psych Negative Neurological ROS  Negative Psych ROS   GI/Hepatic negative GI ROS, Neg liver ROS,   Endo/Other  Negative Endocrine ROSDiabetes mellitus-, Well Controlled, Type 2, Oral Hypoglycemic Agents  Renal/GU negative Renal ROS  Genitourinary negative   Musculoskeletal   Abdominal   Peds  Hematology negative hematology ROS (+)   Anesthesia Other Findings   Reproductive/Obstetrics negative OB ROS                          Anesthesia Physical Anesthesia Plan  ASA: III  Anesthesia Plan: MAC   Post-op Pain Management:    Induction:   Airway Management Planned: Simple Face Mask  Additional Equipment:   Intra-op Plan:   Post-operative Plan:   Informed Consent: I have reviewed the patients History and Physical, chart, labs and discussed the procedure including the risks, benefits and alternatives for the proposed anesthesia with the patient or authorized representative who has indicated his/her understanding and acceptance.   Dental Advisory Given  Plan Discussed with: CRNA and Surgeon  Anesthesia Plan Comments:         Anesthesia Quick Evaluation
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
## 2011-05-23 NOTE — Anesthesia Postprocedure Evaluation (Signed)
  Anesthesia Post-op Note  Patient: Brent Wallace  Procedure(s) Performed:  COLONOSCOPY - pt greater than 350 pounds  Patient Location: PACU  Anesthesia Type: MAC  Level of Consciousness: awake and alert   Airway and Oxygen Therapy: Patient Spontanous Breathing  Post-op Pain: mild  Post-op Assessment: Post-op Vital signs reviewed, Patient's Cardiovascular Status Stable, Respiratory Function Stable, Patent Airway and No signs of Nausea or vomiting  Post-op Vital Signs: stable  Complications: No apparent anesthesia complications

## 2011-05-23 NOTE — Transfer of Care (Signed)
Immediate Anesthesia Transfer of Care Note  Patient: Brent Wallace  Procedure(s) Performed:  COLONOSCOPY - pt greater than 350 pounds  Patient Location: PACU  Anesthesia Type: MAC  Level of Consciousness: sedated, patient cooperative and responds to stimulaton  Airway & Oxygen Therapy: Patient Spontanous Breathing and Patient connected to face mask oxgen  Post-op Assessment: Report given to PACU RN and Post -op Vital signs reviewed and stable  Post vital signs: Reviewed and stable  Complications: No apparent anesthesia complications

## 2011-05-23 NOTE — Op Note (Signed)
[facility] [address] [place], [place]  [address]  COLONOSCOPY PROCEDURE REPORT  PATIENT:  Brent Wallace, Brent Wallace  MR#:  [id number] BIRTHDATE:  April 25, 1961, 50 yrs. old  GENDER:  male ENDOSCOPIST:  Rachael Fee, MD REF. BY:  Tinnie Gens A. Tawanna Cooler, M.D. PROCEDURE DATE:  05/23/2011 PROCEDURE:  Colonoscopy with snare polypectomy ASA CLASS:  Class II INDICATIONS:  Routine Risk Screening MEDICATIONS:   MAC sedation, administered by CRNA  DESCRIPTION OF PROCEDURE:   After the risks benefits and alternatives of the procedure were thoroughly explained, informed consent was obtained.  Digital rectal exam was performed and revealed no rectal masses.   The EC-[id number] ([id number]) endoscope was introduced through the anus and advanced to the cecum, which was identified by both the appendix and ileocecal valve, without limitations.  The quality of the prep was good..  The instrument was then slowly withdrawn as the colon was fully examined. <<PROCEDUREIMAGES>> FINDINGS:  Four sessile polyps were found, all were removed with cold snare, three of them were retrieved and sent to pathology (jar 1). These were located in transverse and rectal segments, ranged in size from 2-3mm across (see image006 and image008). Mild diverticulosis was found in the sigmoid to descending colon segments (see image007).  This was otherwise a normal examination of the colon (see image009, image002, and image004).   Retroflexed views in the rectum revealed no abnormalities. COMPLICATIONS:  None  ENDOSCOPIC IMPRESSION: 1) Four small polyps, all were removed, three were retrieved and sent to patholog 2) Mild diverticulosis in the sigmoid to descending colon segments 3) Otherwise normal examination  RECOMMENDATIONS: 1) If the polyp(s) removed today are proven to be adenomatous (pre-cancerous) polyps, you will need a colonoscopy in 3-5 years. Otherwise you should continue to follow colorectal cancer screening  guidelines for "routine risk" patients with a colonoscopy in 10 years. 2) You will receive a letter within 1-2 weeks with the results of your biopsy as well as final recommendations. Please call my office if you have not received a letter after 3 weeks.  ______________________________ Rachael Fee, MD  n. eSIGNED:   Rachael Fee at 05/23/2011 09:51 AM  Arlee Muslim, [phone]

## 2011-05-24 ENCOUNTER — Encounter (HOSPITAL_COMMUNITY): Payer: Self-pay | Admitting: Gastroenterology

## 2011-07-29 ENCOUNTER — Other Ambulatory Visit: Payer: Self-pay | Admitting: Gastroenterology

## 2011-07-29 ENCOUNTER — Inpatient Hospital Stay (HOSPITAL_COMMUNITY)
Admission: EM | Admit: 2011-07-29 | Discharge: 2011-07-31 | DRG: 378 | Disposition: A | Discharge status: Home / Self Care | Payer: Commercial Managed Care - PPO | Attending: Family Medicine | Admitting: Family Medicine

## 2011-07-29 ENCOUNTER — Encounter (HOSPITAL_COMMUNITY)
Admission: EM | Disposition: A | Discharge status: Home / Self Care | Payer: Self-pay | Source: Home / Self Care | Attending: Internal Medicine

## 2011-07-29 ENCOUNTER — Telehealth: Payer: Self-pay

## 2011-07-29 ENCOUNTER — Encounter (HOSPITAL_COMMUNITY): Payer: Self-pay | Admitting: Family Medicine

## 2011-07-29 DIAGNOSIS — D62 Acute posthemorrhagic anemia: Secondary | ICD-10-CM | POA: Diagnosis present

## 2011-07-29 DIAGNOSIS — E119 Type 2 diabetes mellitus without complications: Secondary | ICD-10-CM | POA: Diagnosis present

## 2011-07-29 DIAGNOSIS — Z8719 Personal history of other diseases of the digestive system: Secondary | ICD-10-CM

## 2011-07-29 DIAGNOSIS — G4733 Obstructive sleep apnea (adult) (pediatric): Secondary | ICD-10-CM | POA: Diagnosis present

## 2011-07-29 DIAGNOSIS — G35 Multiple sclerosis: Secondary | ICD-10-CM

## 2011-07-29 DIAGNOSIS — M545 Low back pain, unspecified: Secondary | ICD-10-CM | POA: Diagnosis present

## 2011-07-29 DIAGNOSIS — Z9989 Dependence on other enabling machines and devices: Secondary | ICD-10-CM

## 2011-07-29 DIAGNOSIS — K922 Gastrointestinal hemorrhage, unspecified: Secondary | ICD-10-CM

## 2011-07-29 DIAGNOSIS — Z87891 Personal history of nicotine dependence: Secondary | ICD-10-CM

## 2011-07-29 DIAGNOSIS — I10 Essential (primary) hypertension: Secondary | ICD-10-CM | POA: Diagnosis present

## 2011-07-29 DIAGNOSIS — G8929 Other chronic pain: Secondary | ICD-10-CM | POA: Diagnosis present

## 2011-07-29 DIAGNOSIS — F909 Attention-deficit hyperactivity disorder, unspecified type: Secondary | ICD-10-CM

## 2011-07-29 DIAGNOSIS — Z8711 Personal history of peptic ulcer disease: Secondary | ICD-10-CM

## 2011-07-29 DIAGNOSIS — N179 Acute kidney failure, unspecified: Secondary | ICD-10-CM | POA: Diagnosis present

## 2011-07-29 DIAGNOSIS — E669 Obesity, unspecified: Secondary | ICD-10-CM

## 2011-07-29 DIAGNOSIS — K921 Melena: Secondary | ICD-10-CM | POA: Diagnosis present

## 2011-07-29 DIAGNOSIS — K264 Chronic or unspecified duodenal ulcer with hemorrhage: Principal | ICD-10-CM | POA: Diagnosis present

## 2011-07-29 DIAGNOSIS — N529 Male erectile dysfunction, unspecified: Secondary | ICD-10-CM

## 2011-07-29 HISTORY — PX: ESOPHAGOGASTRODUODENOSCOPY: SHX5428

## 2011-07-29 HISTORY — DX: Diverticulosis of large intestine without perforation or abscess without bleeding: K57.30

## 2011-07-29 HISTORY — DX: Polyp of colon: K63.5

## 2011-07-29 LAB — CBC
HCT: 37.9 % — ABNORMAL LOW (ref 39.0–52.0)
HCT: 36.8 % — ABNORMAL LOW (ref 39.0–52.0)
Hemoglobin: 13.1 g/dL (ref 13.0–17.0)
Hemoglobin: 12.4 g/dL — ABNORMAL LOW (ref 13.0–17.0)
MCH: 30.6 pg (ref 26.0–34.0)
MCV: 88.6 fL (ref 78.0–100.0)
MCV: 88.5 fL (ref 78.0–100.0)
Platelets: 219 10*3/uL (ref 150–400)
RBC: 4.28 MIL/uL (ref 4.22–5.81)
RBC: 4.16 MIL/uL — ABNORMAL LOW (ref 4.22–5.81)
WBC: 12 10*3/uL — ABNORMAL HIGH (ref 4.0–10.5)
WBC: 11.2 10*3/uL — ABNORMAL HIGH (ref 4.0–10.5)

## 2011-07-29 LAB — BASIC METABOLIC PANEL
CO2: 24 mEq/L (ref 19–32)
Chloride: 104 mEq/L (ref 96–112)
Creatinine, Ser: 1.03 mg/dL (ref 0.50–1.35)
Potassium: 4.2 mEq/L (ref 3.5–5.1)
Sodium: 136 mEq/L (ref 135–145)

## 2011-07-29 LAB — GLUCOSE, CAPILLARY: Glucose-Capillary: 111 mg/dL — ABNORMAL HIGH (ref 70–99)

## 2011-07-29 LAB — COMPREHENSIVE METABOLIC PANEL
ALT: 13 U/L (ref 0–53)
AST: 15 U/L (ref 0–37)
CO2: 26 mEq/L (ref 19–32)
Calcium: 9.3 mg/dL (ref 8.4–10.5)
Chloride: 101 mEq/L (ref 96–112)
Creatinine, Ser: 1.14 mg/dL (ref 0.50–1.35)
GFR calc Af Amer: 84 mL/min — ABNORMAL LOW (ref >90)
GFR calc non Af Amer: 72 mL/min — ABNORMAL LOW (ref >90)
Glucose, Bld: 115 mg/dL — ABNORMAL HIGH (ref 70–99)
Total Bilirubin: 0.3 mg/dL (ref 0.3–1.2)

## 2011-07-29 LAB — POCT I-STAT, CHEM 8
BUN: 54 mg/dL — ABNORMAL HIGH (ref 6–23)
Creatinine, Ser: 1 mg/dL (ref 0.50–1.35)
Glucose, Bld: 113 mg/dL — ABNORMAL HIGH (ref 70–99)
Potassium: 4.4 mEq/L (ref 3.5–5.1)
Sodium: 139 mEq/L (ref 135–145)

## 2011-07-29 LAB — TYPE AND SCREEN
ABO/RH(D): O POS
Antibody Screen: NEGATIVE

## 2011-07-29 LAB — ABO/RH: ABO/RH(D): O POS

## 2011-07-29 LAB — HEMOGLOBIN AND HEMATOCRIT, BLOOD: HCT: 32.5 % — ABNORMAL LOW (ref 39.0–52.0)

## 2011-07-29 SURGERY — EGD (ESOPHAGOGASTRODUODENOSCOPY)
Anesthesia: Moderate Sedation

## 2011-07-29 MED ORDER — AMPHETAMINE-DEXTROAMPHET ER 25 MG PO CP24: 25.0000 mg | ORAL_CAPSULE | ORAL | Status: DC

## 2011-07-29 MED ORDER — AMPHETAMINE-DEXTROAMPHET ER 5 MG PO CP24
5.0000 mg | ORAL_CAPSULE | Freq: Every day | ORAL | Status: DC
  Administered 2011-07-29 – 2011-07-31 (×3): 5 mg via ORAL
  Filled 2011-07-29 (×4): qty 1

## 2011-07-29 MED ORDER — PANTOPRAZOLE SODIUM 40 MG IV SOLR
40.0000 mg | Freq: Two times a day (BID) | INTRAVENOUS | Status: DC
  Administered 2011-07-29 – 2011-07-30 (×3): 40 mg via INTRAVENOUS
  Filled 2011-07-29 (×4): qty 40

## 2011-07-29 MED ORDER — FENTANYL NICU IV SYRINGE 50 MCG/ML
INJECTION | INTRAMUSCULAR | Status: DC | PRN
  Administered 2011-07-29 (×2): 25 ug via INTRAVENOUS

## 2011-07-29 MED ORDER — SODIUM CHLORIDE 0.9 % IV BOLUS (SEPSIS)
1000.0000 mL | Freq: Once | INTRAVENOUS | Status: AC
  Administered 2011-07-29: 1000 mL via INTRAVENOUS

## 2011-07-29 MED ORDER — SODIUM CHLORIDE 0.9 % IV SOLN
INTRAVENOUS | Status: DC
  Administered 2011-07-29: 01:00:00 via INTRAVENOUS

## 2011-07-29 MED ORDER — METHYLPHENIDATE HCL 5 MG PO TABS
20.0000 mg | ORAL_TABLET | Freq: Two times a day (BID) | ORAL | Status: DC
  Filled 2011-07-29: qty 4

## 2011-07-29 MED ORDER — PEG-KCL-NACL-NASULF-NA ASC-C 100 G PO SOLR: 1.0000 | ORAL | Status: DC

## 2011-07-29 MED ORDER — AMPHETAMINE-DEXTROAMPHET ER 10 MG PO CP24
20.0000 mg | ORAL_CAPSULE | Freq: Every day | ORAL | Status: DC
  Administered 2011-07-29 – 2011-07-31 (×3): 20 mg via ORAL
  Filled 2011-07-29: qty 1
  Filled 2011-07-29 (×2): qty 2
  Filled 2011-07-29: qty 1

## 2011-07-29 MED ORDER — MIDAZOLAM HCL 10 MG/2ML IJ SOLN
INTRAMUSCULAR | Status: DC | PRN
  Administered 2011-07-29 (×2): 2 mg via INTRAVENOUS

## 2011-07-29 MED ORDER — BUTAMBEN-TETRACAINE-BENZOCAINE 2-2-14 % EX AERO
INHALATION_SPRAY | CUTANEOUS | Status: DC | PRN
  Administered 2011-07-29: 2 via TOPICAL

## 2011-07-29 MED ORDER — EPINEPHRINE HCL 0.1 MG/ML IJ SOLN
INTRAMUSCULAR | Status: AC
  Filled 2011-07-29: qty 10

## 2011-07-29 MED ORDER — SODIUM CHLORIDE 0.9 % IJ SOLN
INTRAMUSCULAR | Status: DC | PRN
  Administered 2011-07-29: 13:00:00

## 2011-07-29 MED ORDER — GLYCOPYRROLATE 0.2 MG/ML IJ SOLN
INTRAMUSCULAR | Status: DC | PRN
  Administered 2011-07-29: 0.2 mg via INTRAVENOUS

## 2011-07-29 MED ORDER — PANTOPRAZOLE SODIUM 40 MG IV SOLR
40.0000 mg | Freq: Once | INTRAVENOUS | Status: AC
  Administered 2011-07-29: 40 mg via INTRAVENOUS
  Filled 2011-07-29: qty 40

## 2011-07-29 MED ORDER — INDOMETHACIN 25 MG PO CAPS
25.0000 mg | ORAL_CAPSULE | Freq: Every day | ORAL | Status: DC
  Filled 2011-07-29: qty 1

## 2011-07-29 NOTE — ED Notes (Signed)
Per EMS, Patient has had 3 tarry stools today.

## 2011-07-29 NOTE — Progress Notes (Signed)
Pt admitted and oriented to the unit. Pt has no complaints of pain at this time. Pt placed on telemetry and VS are in within normal limits. Call bell placed within reach. Will continue to monitor. Roosvelt Maser, RN

## 2011-07-29 NOTE — ED Notes (Signed)
Patient states that started having blood in his stool 07/28/11 at 0300. States he had 4 bowel movements with blood. The last BM was at 2145. States the amount of blood just got progressively worse. States amount of bleeding is enough to change the color of the toilet water. Denies any abdominal pain or cramping.

## 2011-07-29 NOTE — Progress Notes (Signed)
Patient ID: Brent Wallace, male   DOB: 1960-11-03, 51 y.o.   MRN: [id number] Subjective: Patient seen. Denies any hematochezia or melena. Denies any chest pain or shortness of breath.  Objective: Weight change:   Intake/Output Summary (Last 24 hours) at 07/29/11 1143 Last data filed at 07/29/11 0900  Gross per 24 hour  Intake      0 ml  Output    651 ml  Net   -651 ml   BP 113/74  Pulse 85  Temp(Src) 98 F (36.7 C) (Oral)  Resp 13  Ht 5\' 10"  (1.778 m)  Wt 165.563 kg (365 lb)  BMI 52.37 kg/m2  SpO2 96% Physical Exam: General appearance: alert, cooperative and no distress, mild pallor Head: Normocephalic, without obvious abnormality, atraumatic Neck: no adenopathy, no carotid bruit, no JVD, supple, symmetrical, trachea midline and thyroid not enlarged, symmetric, no tenderness/mass/nodules Lungs: clear to auscultation bilaterally Heart: regular rate and rhythm, S1, S2 normal, no murmur, click, rub or gallop Abdomen: soft, non-tender; bowel sounds normal; no masses,  no organomegaly Extremities: extremities normal, atraumatic, no cyanosis or edema Skin: Normal turgor  Lab Results: Results for orders placed during the hospital encounter of 07/29/11 (from the past 48 hour(s))  CBC     Status: Abnormal   Collection Time   07/29/11  1:30 AM      Component Value Range Comment   WBC 12.0 (*) 4.0 - 10.5 (K/uL)    RBC 4.28  4.22 - 5.81 (MIL/uL)    Hemoglobin 13.1  13.0 - 17.0 (g/dL)    HCT 78.2 (*) 95.6 - 52.0 (%)    MCV 88.6  78.0 - 100.0 (fL)    MCH 30.6  26.0 - 34.0 (pg)    MCHC 34.6  30.0 - 36.0 (g/dL)    RDW 21.3  08.6 - 57.8 (%)    Platelets 219  150 - 400 (K/uL)   COMPREHENSIVE METABOLIC PANEL     Status: Abnormal   Collection Time   07/29/11  1:30 AM      Component Value Range Comment   Sodium 137  135 - 145 (mEq/L)    Potassium 4.2  3.5 - 5.1 (mEq/L)    Chloride 101  96 - 112 (mEq/L)    CO2 26  19 - 32 (mEq/L)    Glucose, Bld 115 (*) 70 - 99 (mg/dL)    BUN 51 (*) 6  - 23 (mg/dL)    Creatinine, Ser 4.69  0.50 - 1.35 (mg/dL)    Calcium 9.3  8.4 - 10.5 (mg/dL)    Total Protein 6.3  6.0 - 8.3 (g/dL)    Albumin 3.2 (*) 3.5 - 5.2 (g/dL)    AST 15  0 - 37 (U/L)    ALT 13  0 - 53 (U/L)    Alkaline Phosphatase 70  39 - 117 (U/L)    Total Bilirubin 0.3  0.3 - 1.2 (mg/dL)    GFR calc non Af Amer 72 (*) >90 (mL/min)    GFR calc Af Amer 84 (*) >90 (mL/min)   TYPE AND SCREEN     Status: Normal   Collection Time   07/29/11  1:30 AM      Component Value Range Comment   ABO/RH(D) O POS      Antibody Screen NEG      Sample Expiration 08/01/2011     LACTIC ACID, PLASMA     Status: Normal   Collection Time   07/29/11  1:31 AM  Component Value Range Comment   Lactic Acid, Venous 1.9  0.5 - 2.2 (mmol/L)   OCCULT BLOOD, POC DEVICE     Status: Normal   Collection Time   07/29/11  1:32 AM      Component Value Range Comment   Fecal Occult Bld POSITIVE     POCT I-STAT, CHEM 8     Status: Abnormal   Collection Time   07/29/11  1:38 AM      Component Value Range Comment   Sodium 139  135 - 145 (mEq/L)    Potassium 4.4  3.5 - 5.1 (mEq/L)    Chloride 108  96 - 112 (mEq/L)    BUN 54 (*) 6 - 23 (mg/dL)    Creatinine, Ser 1.61  0.50 - 1.35 (mg/dL)    Glucose, Bld 096 (*) 70 - 99 (mg/dL)    Calcium, Ion 0.45  1.12 - 1.32 (mmol/L)    TCO2 26  0 - 100 (mmol/L)    Hemoglobin 13.3  13.0 - 17.0 (g/dL)    HCT 40.9  81.1 - 91.4 (%)   ABO/RH     Status: Normal   Collection Time   07/29/11  3:00 AM      Component Value Range Comment   ABO/RH(D) O POS     BASIC METABOLIC PANEL     Status: Abnormal   Collection Time   07/29/11  4:44 AM      Component Value Range Comment   Sodium 136  135 - 145 (mEq/L)    Potassium 4.2  3.5 - 5.1 (mEq/L)    Chloride 104  96 - 112 (mEq/L)    CO2 24  19 - 32 (mEq/L)    Glucose, Bld 130 (*) 70 - 99 (mg/dL)    BUN 44 (*) 6 - 23 (mg/dL)    Creatinine, Ser 7.82  0.50 - 1.35 (mg/dL)    Calcium 8.6  8.4 - 10.5 (mg/dL)    GFR calc non Af Amer  82 (*) >90 (mL/min)    GFR calc Af Amer >90  >90 (mL/min)   CBC     Status: Abnormal   Collection Time   07/29/11  4:44 AM      Component Value Range Comment   WBC 11.2 (*) 4.0 - 10.5 (K/uL)    RBC 4.16 (*) 4.22 - 5.81 (MIL/uL)    Hemoglobin 12.4 (*) 13.0 - 17.0 (g/dL)    HCT 95.6 (*) 21.3 - 52.0 (%)    MCV 88.5  78.0 - 100.0 (fL)    MCH 29.8  26.0 - 34.0 (pg)    MCHC 33.7  30.0 - 36.0 (g/dL)    RDW 08.6  57.8 - 46.9 (%)    Platelets 237  150 - 400 (K/uL)   GLUCOSE, CAPILLARY     Status: Abnormal   Collection Time   07/29/11 10:48 AM      Component Value Range Comment   Glucose-Capillary 111 (*) 70 - 99 (mg/dL)     Micro Results: No results found for this or any previous visit (from the past 240 hour(s)).  Studies/Results: No results found. Medications: Scheduled Meds:   . amphetamine-dextroamphetamine  20 mg Oral Daily   And  . amphetamine-dextroamphetamine  5 mg Oral Daily  . pantoprazole (PROTONIX) IV  40 mg Intravenous Once  . pantoprazole (PROTONIX) IV  40 mg Intravenous Q12H  . sodium chloride  1,000 mL Intravenous Once  . sodium chloride  1,000 mL Intravenous Once  . DISCONTD:  amphetamine-dextroamphetamine  25 mg Oral [id number]  . DISCONTD: indomethacin  25 mg Oral Daily  . DISCONTD: methylphenidate  20 mg Oral BID   Continuous Infusions:   . sodium chloride     PRN Meds:.butamben-tetracaine-benzocaine, fentaNYL, glycopyrrolate, midazolam  Assessment/Plan:  #1 Melena questionable upper GI bleed. Patient is scheduled for upper endoscopy today   #2 History of stomach ulcers-continue IV pantoprazole   #3 OSA on CPAP  #4 Diabetes mellitus  #5 Hypertension-blood pressure fairly stable   #6 ADD (attention deficit disorder with hyperactivity)-will continue adderal   LOS: 0 days   Kwynn Schlotter 07/29/2011, 11:43 AM

## 2011-07-29 NOTE — Op Note (Signed)
[facility] [address] [place], [place]  [address]  ENDOSCOPY PROCEDURE REPORT  PATIENT:  Brent Wallace, Brent Wallace  MR#:  [id number] BIRTHDATE:  04-22-1961, 51 yrs. old  GENDER:  male  ENDOSCOPIST:  Barbette Hair. Arlyce Dice, MD Referred by:  PROCEDURE DATE:  07/29/2011 PROCEDURE:  EGD with biopsy, 43239, EGD for control of bleeding ASA CLASS:  Class II INDICATIONS:  melena  MEDICATIONS:   These medications were titrated to patient response per physician's verbal order, Fentanyl 50 mcg IV, Versed 4 mg IV, glycopyrrolate (Robinal) 0.2 mg IV TOPICAL ANESTHETIC:  Cetacaine Spray  DESCRIPTION OF PROCEDURE:   After the risks and benefits of the procedure were explained, informed consent was obtained.  The Pentax Gastroscope [id number] endoscope was introduced through the mouth and advanced to the third portion of the duodenum.  The instrument was slowly withdrawn as the mucosa was fully examined. <<PROCEDUREIMAGES>>  An ulcer was found in the bulb of the duodenum. 6 mm ulcer along the anterior wall of the duodenal bulb with visible vessels.  He was injected with 3 cc of 1:10,000 epinephrine. Following this 2 endoscopic clips were applied. No active bleeding was observed. A biopsy was taken in the duodenal bulb to rule out H (see image2 and image5). pylori  Otherwise the examination was normal (see image1, image6, and image7).    Retroflexed views revealed no abnormalities.    The scope was then withdrawn from the patient and the procedure completed.  COMPLICATIONS:  None  ENDOSCOPIC IMPRESSION: 1) Ulcer in the bulb of duodenum - status post epinephrine injection and clipping 2) Otherwise normal examination RECOMMENDATIONS: 1) continue PPI  ______________________________ Barbette Hair. Arlyce Dice, MD  CC:  Roderick Pee, MD  n. Rosalie Doctor:   [name] at 07/29/2011 11:57 AM  Arlee Muslim, [phone]

## 2011-07-29 NOTE — Telephone Encounter (Signed)
[facility] Triage Call Report Triage Record Num: [id number] Operator: Alphonsa Overall Patient Name: Brent Wallace Call Date & Time: 07/28/2011 10:47:23PM Patient Phone: [phone] PCP: Eugenio Hoes. Todd Patient Gender: Male PCP Fax : [phone] Patient DOB: 1961-01-30 Practice Name: [facility] Reason for Call: Caller: Shirley/Spouse; PCP: Other; CB#: [phone]; Call Reason: Black Stools Shirley/wife calling about black stools. Onset 07/28/11. 3 black stools with redness. BP low, weak, 98/50 HR 94. Sweating. No pain. Pt worsening quickly. Will call 911 now. Protocol(s) Used: Gastrointestinal Bleeding Recommended Outcome per Protocol: Activate EMS 911 Reason for Outcome: Passing red, black or tarry material from rectum AND onset of new signs and symptoms of hypovolemia Care Advice: ~ Do not give the patient anything to eat or drink. ~ An adult should stay with the patient, preferably one trained in CPR. Lay the person down and elevate legs at least 12 inches (30 cm) above level of heart. Cover to help maintain body temperature. ~ ~ IMMEDIATE ACTION Write down provider's name. List or place the following in a bag for transport with the patient: current prescription and/or nonprescription medications; alternative treatments, therapies and medications; and street drugs. ~ 07/28/2011 10:53:26PM Page 1 of 1 [email or web address]

## 2011-07-29 NOTE — Progress Notes (Signed)
Upper endoscopy shows an acute ulcer in the duodenal bulb with a visible vessel. The ulcer was injected with epinephrine and 2 endoscopic clips were applied. There was no active bleeding.
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
## 2011-07-29 NOTE — ED Notes (Signed)
ZOX:WRUE<AV> Expected date:07/29/11<BR> Expected time:12:06 AM<BR> Means of arrival:Ambulance<BR> Comments:<BR> ems GI bleed, hypotensive

## 2011-07-29 NOTE — Consult Note (Signed)
Chart was reviewed and patient was examined. X-rays were reviewed.    I agree with management and plans. The patient is a 51 year old average American male admitted with melena. He has a history of bleeding gastric ulcers and has recently been taking NSAIDs. I suspect he has an acute upper GI bleed and his NSAID-related. Plan as described above.  Barbette Hair. Arlyce Dice, M.D., [facility]
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
## 2011-07-30 ENCOUNTER — Encounter (HOSPITAL_COMMUNITY): Payer: Self-pay | Admitting: Gastroenterology

## 2011-07-30 LAB — CBC
Hemoglobin: 10.6 g/dL — ABNORMAL LOW (ref 13.0–17.0)
RBC: 3.58 MIL/uL — ABNORMAL LOW (ref 4.22–5.81)

## 2011-07-30 LAB — HEMOGLOBIN AND HEMATOCRIT, BLOOD: Hemoglobin: 10.7 g/dL — ABNORMAL LOW (ref 13.0–17.0)

## 2011-07-30 LAB — DIFFERENTIAL
Basophils Absolute: 0.1 10*3/uL (ref 0.0–0.1)
Eosinophils Absolute: 0.2 10*3/uL (ref 0.0–0.7)
Eosinophils Relative: 2 % (ref 0–5)
Lymphs Abs: 3.5 10*3/uL (ref 0.7–4.0)

## 2011-07-30 LAB — COMPREHENSIVE METABOLIC PANEL
ALT: 14 U/L (ref 0–53)
Alkaline Phosphatase: 65 U/L (ref 39–117)
CO2: 28 mEq/L (ref 19–32)
GFR calc Af Amer: >90 mL/min (ref >90)
GFR calc non Af Amer: >90 mL/min (ref >90)
Glucose, Bld: 116 mg/dL — ABNORMAL HIGH (ref 70–99)
Potassium: 3.8 mEq/L (ref 3.5–5.1)
Sodium: 136 mEq/L (ref 135–145)

## 2011-07-30 MED ORDER — PANTOPRAZOLE SODIUM 40 MG PO TBEC
40.0000 mg | DELAYED_RELEASE_TABLET | Freq: Two times a day (BID) | ORAL | Status: DC
  Administered 2011-07-30 – 2011-07-31 (×3): 40 mg via ORAL
  Filled 2011-07-30 (×5): qty 1

## 2011-07-30 MED ORDER — ACETAMINOPHEN 325 MG PO TABS
650.0000 mg | ORAL_TABLET | Freq: Four times a day (QID) | ORAL | Status: DC | PRN
  Administered 2011-07-30 – 2011-07-31 (×4): 650 mg via ORAL
  Filled 2011-07-30 (×3): qty 2

## 2011-07-30 MED ORDER — ACETAMINOPHEN 325 MG PO TABS
ORAL_TABLET | ORAL | Status: AC
  Administered 2011-07-30: 650 mg via ORAL
  Filled 2011-07-30: qty 2

## 2011-07-30 NOTE — Progress Notes (Signed)
Patient has brought on his own cpap unit. Chord inspected for safety and plugged into the red outlet. Will notify service respnse to do check in am.

## 2011-07-30 NOTE — Progress Notes (Signed)
No further bleeding.  Recommend 2 week course of BID PPI Rx, then can reduce it to qd. Check bxs for H pylori

## 2011-07-30 NOTE — Progress Notes (Signed)
Patient ID: Brent Wallace, male   DOB: 09/19/1960, 51 y.o.   MRN: [id number] Patient ID: Brent Wallace, male   DOB: August 18, 1960, 51 y.o.   MRN: [id number] Subjective: Patient seen. Had upper endoscopy done yesterday and it showed ulcer in the bulb of the duodenum which was injected with epinephrine and subsequently clipped. Presently he denies any hematochezia or melena  Objective: Weight change: 2.263 kg (4 lb 15.8 oz)  Intake/Output Summary (Last 24 hours) at 07/30/11 1506 Last data filed at 07/30/11 1300  Gross per 24 hour  Intake    980 ml  Output   2875 ml  Net  -1895 ml   BP 164/139  Pulse 96  Temp(Src) 98.5 F (36.9 C) (Oral)  Resp 18  Ht 5\' 10"  (1.778 m)  Wt 165.563 kg (365 lb)  BMI 52.37 kg/m2  SpO2 99% Physical Exam: General appearance: alert, cooperative and no distress, mild pallor Head: Normocephalic, without obvious abnormality, atraumatic Neck: no adenopathy, no carotid bruit, no JVD, supple, symmetrical, trachea midline and thyroid not enlarged, symmetric, no tenderness/mass/nodules Lungs: clear to auscultation bilaterally Heart: regular rate and rhythm, S1, S2 normal, no murmur, click, rub or gallop Abdomen: soft, non-tender; bowel sounds normal; no masses,  no organomegaly Extremities: extremities normal, atraumatic, no cyanosis or edema Skin: Normal turgor  Lab Results: Results for orders placed during the hospital encounter of 07/29/11 (from the past 48 hour(s))  CBC     Status: Abnormal   Collection Time   07/29/11  1:30 AM      Component Value Range Comment   WBC 12.0 (*) 4.0 - 10.5 (K/uL)    RBC 4.28  4.22 - 5.81 (MIL/uL)    Hemoglobin 13.1  13.0 - 17.0 (g/dL)    HCT 84.1 (*) 32.4 - 52.0 (%)    MCV 88.6  78.0 - 100.0 (fL)    MCH 30.6  26.0 - 34.0 (pg)    MCHC 34.6  30.0 - 36.0 (g/dL)    RDW 40.1  02.7 - 25.3 (%)    Platelets 219  150 - 400 (K/uL)   COMPREHENSIVE METABOLIC PANEL     Status: Abnormal   Collection Time   07/29/11  1:30 AM   Component Value Range Comment   Sodium 137  135 - 145 (mEq/L)    Potassium 4.2  3.5 - 5.1 (mEq/L)    Chloride 101  96 - 112 (mEq/L)    CO2 26  19 - 32 (mEq/L)    Glucose, Bld 115 (*) 70 - 99 (mg/dL)    BUN 51 (*) 6 - 23 (mg/dL)    Creatinine, Ser 6.64  0.50 - 1.35 (mg/dL)    Calcium 9.3  8.4 - 10.5 (mg/dL)    Total Protein 6.3  6.0 - 8.3 (g/dL)    Albumin 3.2 (*) 3.5 - 5.2 (g/dL)    AST 15  0 - 37 (U/L)    ALT 13  0 - 53 (U/L)    Alkaline Phosphatase 70  39 - 117 (U/L)    Total Bilirubin 0.3  0.3 - 1.2 (mg/dL)    GFR calc non Af Amer 72 (*) >90 (mL/min)    GFR calc Af Amer 84 (*) >90 (mL/min)   TYPE AND SCREEN     Status: Normal   Collection Time   07/29/11  1:30 AM      Component Value Range Comment   ABO/RH(D) O POS      Antibody Screen NEG  Sample Expiration 08/01/2011     LACTIC ACID, PLASMA     Status: Normal   Collection Time   07/29/11  1:31 AM      Component Value Range Comment   Lactic Acid, Venous 1.9  0.5 - 2.2 (mmol/L)   OCCULT BLOOD, POC DEVICE     Status: Normal   Collection Time   07/29/11  1:32 AM      Component Value Range Comment   Fecal Occult Bld POSITIVE     POCT I-STAT, CHEM 8     Status: Abnormal   Collection Time   07/29/11  1:38 AM      Component Value Range Comment   Sodium 139  135 - 145 (mEq/L)    Potassium 4.4  3.5 - 5.1 (mEq/L)    Chloride 108  96 - 112 (mEq/L)    BUN 54 (*) 6 - 23 (mg/dL)    Creatinine, Ser 1.61  0.50 - 1.35 (mg/dL)    Glucose, Bld 096 (*) 70 - 99 (mg/dL)    Calcium, Ion 0.45  1.12 - 1.32 (mmol/L)    TCO2 26  0 - 100 (mmol/L)    Hemoglobin 13.3  13.0 - 17.0 (g/dL)    HCT 40.9  81.1 - 91.4 (%)   ABO/RH     Status: Normal   Collection Time   07/29/11  3:00 AM      Component Value Range Comment   ABO/RH(D) O POS     BASIC METABOLIC PANEL     Status: Abnormal   Collection Time   07/29/11  4:44 AM      Component Value Range Comment   Sodium 136  135 - 145 (mEq/L)    Potassium 4.2  3.5 - 5.1 (mEq/L)    Chloride 104  96  - 112 (mEq/L)    CO2 24  19 - 32 (mEq/L)    Glucose, Bld 130 (*) 70 - 99 (mg/dL)    BUN 44 (*) 6 - 23 (mg/dL)    Creatinine, Ser 7.82  0.50 - 1.35 (mg/dL)    Calcium 8.6  8.4 - 10.5 (mg/dL)    GFR calc non Af Amer 82 (*) >90 (mL/min)    GFR calc Af Amer >90  >90 (mL/min)   CBC     Status: Abnormal   Collection Time   07/29/11  4:44 AM      Component Value Range Comment   WBC 11.2 (*) 4.0 - 10.5 (K/uL)    RBC 4.16 (*) 4.22 - 5.81 (MIL/uL)    Hemoglobin 12.4 (*) 13.0 - 17.0 (g/dL)    HCT 95.6 (*) 21.3 - 52.0 (%)    MCV 88.5  78.0 - 100.0 (fL)    MCH 29.8  26.0 - 34.0 (pg)    MCHC 33.7  30.0 - 36.0 (g/dL)    RDW 08.6  57.8 - 46.9 (%)    Platelets 237  150 - 400 (K/uL)   GLUCOSE, CAPILLARY     Status: Abnormal   Collection Time   07/29/11 10:48 AM      Component Value Range Comment   Glucose-Capillary 111 (*) 70 - 99 (mg/dL)   HEMOGLOBIN AND HEMATOCRIT, BLOOD     Status: Abnormal   Collection Time   07/29/11  3:40 PM      Component Value Range Comment   Hemoglobin 11.0 (*) 13.0 - 17.0 (g/dL)    HCT 62.9 (*) 52.8 - 52.0 (%)   CBC     Status:  Abnormal   Collection Time   07/30/11  3:07 AM      Component Value Range Comment   WBC 7.8  4.0 - 10.5 (K/uL)    RBC 3.58 (*) 4.22 - 5.81 (MIL/uL)    Hemoglobin 10.6 (*) 13.0 - 17.0 (g/dL)    HCT 16.1 (*) 09.6 - 52.0 (%)    MCV 88.0  78.0 - 100.0 (fL)    MCH 29.6  26.0 - 34.0 (pg)    MCHC 33.7  30.0 - 36.0 (g/dL)    RDW 04.5  40.9 - 81.1 (%)    Platelets 210  150 - 400 (K/uL)   COMPREHENSIVE METABOLIC PANEL     Status: Abnormal   Collection Time   07/30/11  3:07 AM      Component Value Range Comment   Sodium 136  135 - 145 (mEq/L)    Potassium 3.8  3.5 - 5.1 (mEq/L)    Chloride 103  96 - 112 (mEq/L)    CO2 28  19 - 32 (mEq/L)    Glucose, Bld 116 (*) 70 - 99 (mg/dL)    BUN 16  6 - 23 (mg/dL)    Creatinine, Ser 9.14  0.50 - 1.35 (mg/dL)    Calcium 8.8  8.4 - 10.5 (mg/dL)    Total Protein 5.9 (*) 6.0 - 8.3 (g/dL)    Albumin 3.2 (*)  3.5 - 5.2 (g/dL)    AST 15  0 - 37 (U/L)    ALT 14  0 - 53 (U/L)    Alkaline Phosphatase 65  39 - 117 (U/L)    Total Bilirubin 0.3  0.3 - 1.2 (mg/dL)    GFR calc non Af Amer >90  >90 (mL/min)    GFR calc Af Amer >90  >90 (mL/min)   DIFFERENTIAL     Status: Normal   Collection Time   07/30/11  3:07 AM      Component Value Range Comment   Neutrophils Relative 44  43 - 77 (%)    Neutro Abs 3.5  1.7 - 7.7 (K/uL)    Lymphocytes Relative 45  12 - 46 (%)    Lymphs Abs 3.5  0.7 - 4.0 (K/uL)    Monocytes Relative 7  3 - 12 (%)    Monocytes Absolute 0.6  0.1 - 1.0 (K/uL)    Eosinophils Relative 2  0 - 5 (%)    Eosinophils Absolute 0.2  0.0 - 0.7 (K/uL)    Basophils Relative 1  0 - 1 (%)    Basophils Absolute 0.1  0.0 - 0.1 (K/uL)     Micro Results: No results found for this or any previous visit (from the past 240 hour(s)).  Studies/Results: No results found. Medications: Scheduled Meds:    . amphetamine-dextroamphetamine  20 mg Oral Daily   And  . amphetamine-dextroamphetamine  5 mg Oral Daily  . pantoprazole (PROTONIX) IV  40 mg Intravenous Q12H   Continuous Infusions:  PRN Meds:.acetaminophen, DISCONTD: EPINEPHrine 1:10,000, 10 mL syringe/NS, 10 mL vial for sclerotherapy inj mixture  Assessment/Plan:  #1 Melena status post upper endoscopy and this showed a bleeding ulcer in the duodenal bulb which was injected with epinephrine and subsequently clipped. Plan is to discontinue IV pantoprazole and start by mouth pantoprazole and to advance patient's diet as tolerated. #2 anemia-H&H fairly stable   #3 History of stomach ulcers-continue pantoprazole  #4 OSA on CPAP  #5 Diabetes mellitus  #6 Hypertension-blood pressure fairly stable   #7 ADD (attention  deficit disorder with hyperactivity)-will continue adderal.        If H&H is stable by a.m,pls d/c home   LOS: 1 day   Lashika Erker 07/30/2011, 3:06 PM

## 2011-07-30 NOTE — Progress Notes (Signed)
Chart review performed next review due on [id number]

## 2011-07-30 NOTE — Progress Notes (Signed)
[facility] Progress Note  SUBJECTIVE: feels okay, no further GI bleeding. Hungry  OBJECTIVE:  Vital signs in last 24 hours: Temp:  [97.5 F (36.4 C)-98.5 F (36.9 C)] 97.5 F (36.4 C) (02/19 0504) Pulse Rate:  [87-98] 87  (02/19 0504) Resp:  [11-36] 18  (02/19 0504) BP: (92-137)/(42-83) 137/79 mmHg (02/19 0504) SpO2:  [94 %-100 %] 94 % (02/19 0504) Weight:  [365 lb (165.563 kg)] 365 lb (165.563 kg) (02/18 1110) Last BM Date: 07/29/11 General:    Pleasant black male in NAD Heart:  Regular rate and rhythm Abdomen:  Soft, nontender and nondistended. Normal bowel sounds. Extremities:  Without edema. Neurologic:  Alert and oriented,  grossly normal neurologically. Psych:  Cooperative. Normal mood and affect.  Lab Results:  Basename 07/30/11 0307 07/29/11 1540 07/29/11 0444 07/29/11 0130  WBC 7.8 -- 11.2* 12.0*  HGB 10.6* 11.0* 12.4* --  HCT 31.5* 32.5* 36.8* --  PLT 210 -- 237 219   BMET  Basename 07/30/11 0307 07/29/11 0444 07/29/11 0138 07/29/11 0130  NA 136 136 139 --  K 3.8 4.2 4.4 --  CL 103 104 108 --  CO2 28 24 -- 26  GLUCOSE 116* 130* 113* --  BUN 16 44* 54* --  CREATININE 0.86 1.03 1.00 --  CALCIUM 8.8 8.6 -- 9.3   LFT  Basename 07/30/11 0307  PROT 5.9*  ALBUMIN 3.2*  AST 15  ALT 14  ALKPHOS 65  BILITOT 0.3  BILIDIR --  IBILI --   ASSESSMENT / PLAN:   UGI bleed secondary to duodenal ulcer with visible vessel. S/P EGD with epinephrine injection and clipping yesterday. No BMs or evidence of bleeding since. Hemoglobin stable at 10.6 today. He is hungry. Will advance diet to fulls. Continue BID PPI for 6 weeks then daily for another 4-6 weeks. Discussed potential for Indocin and other anti-inflammatories to cause gastrointestinal ulcers. He takes Cymbalta for back pain but may need additional therapies for pain control since NSAIDS are out, at least for now.     LOS: 1 day   Willette Cluster  07/30/2011, 10:21 AM

## 2011-07-31 DIAGNOSIS — K921 Melena: Secondary | ICD-10-CM

## 2011-07-31 LAB — CBC
MCH: 29.8 pg (ref 26.0–34.0)
MCV: 88.7 fL (ref 78.0–100.0)
Platelets: 210 10*3/uL (ref 150–400)
RBC: 3.46 MIL/uL — ABNORMAL LOW (ref 4.22–5.81)

## 2011-07-31 LAB — COMPREHENSIVE METABOLIC PANEL
AST: 17 U/L (ref 0–37)
BUN: 15 mg/dL (ref 6–23)
CO2: 25 mEq/L (ref 19–32)
Calcium: 8.4 mg/dL (ref 8.4–10.5)
Creatinine, Ser: 0.84 mg/dL (ref 0.50–1.35)
GFR calc Af Amer: >90 mL/min (ref >90)
GFR calc non Af Amer: >90 mL/min (ref >90)
Glucose, Bld: 130 mg/dL — ABNORMAL HIGH (ref 70–99)

## 2011-07-31 LAB — DIFFERENTIAL
Basophils Absolute: 0.1 10*3/uL (ref 0.0–0.1)
Lymphocytes Relative: 45 % (ref 12–46)
Neutro Abs: 3.1 10*3/uL (ref 1.7–7.7)
Neutrophils Relative %: 43 % (ref 43–77)

## 2011-07-31 MED ORDER — PANTOPRAZOLE SODIUM 40 MG PO TBEC: 40.0000 mg | DELAYED_RELEASE_TABLET | Freq: Two times a day (BID) | ORAL | Status: DC

## 2011-07-31 MED ORDER — ACETAMINOPHEN 325 MG PO TABS: 650.0000 mg | ORAL_TABLET | Freq: Four times a day (QID) | ORAL | Status: AC | PRN

## 2011-07-31 MED ORDER — OXYCODONE-ACETAMINOPHEN 7.5-500 MG PO TABS: 1.0000 | ORAL_TABLET | ORAL | Status: AC | PRN

## 2011-07-31 NOTE — Progress Notes (Signed)
Subjective: **No GI complaints.*  Objective: Vital signs in last 24 hours: Temp:  [98.2 F (36.8 C)-98.8 F (37.1 C)] 98.2 F (36.8 C) (02/20 0981) Pulse Rate:  [79-96] 79  (02/20 0638) Resp:  [18-20] 20  (02/20 1914) BP: (102-164)/(69-139) 121/77 mmHg (02/20 0638) SpO2:  [97 %-99 %] 97 % (02/20 7829) Last BM Date: 07/30/11 General:   Alert,  Well-developed, well-nourished, pleasant and cooperative in NAD Head:  Normocephalic and atraumatic. Eyes:  Sclera clear, no icterus.   Conjunctiva pink. Mouth:  No deformity or lesions, dentition normal. Neck:  Supple; no masses or thyromegaly. Heart:  Regular rate and rhythm; no murmurs, clicks, rubs,  or gallops. Abdomen:  Soft, nontender and nondistended. No masses, hepatosplenomegaly or hernias noted. Normal bowel sounds, without guarding, and without rebound.   Msk:  Symmetrical without gross deformities. Normal posture. Pulses:  Normal pulses noted. Extremities:  Without clubbing or edema. Neurologic:  Alert and  oriented x4;  grossly normal neurologically. Skin:  Intact without significant lesions or rashes. Cervical Nodes:  No significant cervical adenopathy. Psych:  Alert and cooperative. Normal mood and affect.  Intake/Output from previous day: 02/19 0701 - 02/20 0700 In: 1440 [P.O.:1440] Out: 2215 [Urine:2215] Intake/Output this shift: Total I/O In: 420 [P.O.:420] Out: 300 [Urine:300]  Lab Results:  Basename 07/31/11 0318 07/30/11 1610 07/30/11 0307 07/29/11 0444  WBC 7.2 -- 7.8 11.2*  HGB 10.3* 10.7* 10.6* --  HCT 30.7* 32.0* 31.5* --  PLT 210 -- 210 237   BMET  Basename 07/31/11 0318 07/30/11 0307 07/29/11 0444  NA 140 136 136  K 3.7 3.8 4.2  CL 105 103 104  CO2 25 28 24   GLUCOSE 130* 116* 130*  BUN 15 16 44*  CREATININE 0.84 0.86 1.03  CALCIUM 8.4 8.8 8.6   LFT  Basename 07/31/11 0318  PROT 6.2  ALBUMIN 3.2*  AST 17  ALT 18  ALKPHOS 71  BILITOT 0.2*  BILIDIR --  IBILI --   PT/INR No results  found for this basename: LABPROT:2,INR:2 in the last 72 hours Hepatitis Panel No results found for this basename: HEPBSAG,HCVAB,HEPAIGM,HEPBIGM in the last 72 hours   Studies/Results: No results found.  Assessment: *Acute upper GI bleed secondary to duodenal ulcer status post epinephrine injection and endoscopic clipping. Patient has had no further bleeding. Patient is H. pylori negative.  Recommendations*- continue PPI therapy as per outlined in yesterday's note. I will see patient in followup in the office. He was instructed to avoid NSAIDs.Barbette Hair. Arlyce Dice, MD, [facility] [phone]   Melvia Heaps  07/31/2011, 10:49 AM

## 2011-07-31 NOTE — Discharge Summary (Signed)
[facility] Hospital Discharge Summary  To do on followup-obtain CBC, assess pain control as is off of his Cymbalta and indomethacin  51 year old male presented to the emergency room 07/29/11 with dark black stools, lightheadedness, dizziness-he had an epigastric pain that started having cramping in bilateral lower quadrants. Restart indomethacin for hip and back pain., Had a bleeding ulcer in 1993 it is non-PPIs. Had colonoscopy December 2000 normal except for 2 benign polyps.  He states his stools were black.  Workup in emergency room showed hemoglobin of 12.4 hematocrit 36.8, BUN to creatinine was elevated at 54/1.00  Date of Admission: 07/29/2011 12:20 AM Admitter: @ADMITPROV @   Date of Discharge2/20/2013 Attending Physician: Pleas Koch, MD  Hospital Course by problem list: Patient Active Hospital Problem List: Melena (07/29/2011)   Assessment: Patient had serial counts of blood count and hemoglobin dropped as low as 10.3. Gastroenterology Dr. Arlyce Dice saw the patient and ultimately patient had a upper endoscopy 07/29/11 showing acute ulcer and duodenal bulb with a visible vessel which was injected epinephrine to endoscopic clips were applied. Patient is recommended to continue Protonix or equivalent of 4-3 weeks and then reassess. Patient has had one bloody stool since procedure and is a little constipated. Patient will followup with gastroenterology. Apices done for H. pylori negative and the surgical pathology is pending which can be followed up then.  History of stomach ulcers (07/29/2011)   Assessment: See above  OSA on CPAP (07/29/2011)   Assessment: Continue CPAP at home  Diabetes mellitus (07/29/2011)   Assessment: Patient metformin 500 mg by mouth twice daily patient continue the same as an outpatient. Blood sugars very well-controlled in hospital  Hypertension (07/29/2011)   Assessment: Well controlled on lisinopril and HCTZ-would continue the same. He had some mild renal  insufficiency and this is likely secondary to blood being digested ADD (attention deficit disorder with hyperactivity) (07/29/2011)   Assessment: Continue Adderall  AKI-BUN/creatinine ratio dropped from admission labs as per above 15/0.84  Acute posthemorrhagic anemia (07/29/2011)   Assessment: Stablized Chronic Low back pain-Cymbalta and indomethacin have been discontinued. I placed him on Tylenol and Percocet. He followup with his primary care physician. He follows up with [facility] for possibility of lower back injections. I would encourage him to lose weight and get this looked at in the near future   LOS: 2 days     Procedures Performed and pertinent labs: Endoscopy as per above  Discharge Vitals & PE:  BP 121/77  Pulse 79  Temp(Src) 98.2 F (36.8 C) (Oral)  Resp 20  Ht 5\' 10"  (1.778 m)  Wt 165.563 kg (365 lb)  BMI 52.37 kg/m2  SpO2 97% Alert oriented Chest clinically clear S1-S2 no murmur rub or gallop Abdomen soft nontender nondistended obese   skin intact  Discharge Labs:  Results for orders placed during the hospital encounter of 07/29/11 (from the past 24 hour(s))  HEMOGLOBIN AND HEMATOCRIT, BLOOD     Status: Abnormal   Collection Time   07/30/11  4:10 PM      Component Value Range   Hemoglobin 10.7 (*) 13.0 - 17.0 (g/dL)   HCT 14.7 (*) 82.9 - 52.0 (%)  OCCULT BLOOD X 1 CARD TO LAB, STOOL     Status: Normal   Collection Time   07/30/11  7:21 PM      Component Value Range   Fecal Occult Bld POSITIVE    CBC     Status: Abnormal   Collection Time   07/31/11  3:18 AM  Component Value Range   WBC 7.2  4.0 - 10.5 (K/uL)   RBC 3.46 (*) 4.22 - 5.81 (MIL/uL)   Hemoglobin 10.3 (*) 13.0 - 17.0 (g/dL)   HCT 16.1 (*) 09.6 - 52.0 (%)   MCV 88.7  78.0 - 100.0 (fL)   MCH 29.8  26.0 - 34.0 (pg)   MCHC 33.6  30.0 - 36.0 (g/dL)   RDW 04.5  40.9 - 81.1 (%)   Platelets 210  150 - 400 (K/uL)  COMPREHENSIVE METABOLIC PANEL     Status: Abnormal   Collection Time     07/31/11  3:18 AM      Component Value Range   Sodium 140  135 - 145 (mEq/L)   Potassium 3.7  3.5 - 5.1 (mEq/L)   Chloride 105  96 - 112 (mEq/L)   CO2 25  19 - 32 (mEq/L)   Glucose, Bld 130 (*) 70 - 99 (mg/dL)   BUN 15  6 - 23 (mg/dL)   Creatinine, Ser 9.14  0.50 - 1.35 (mg/dL)   Calcium 8.4  8.4 - 78.2 (mg/dL)   Total Protein 6.2  6.0 - 8.3 (g/dL)   Albumin 3.2 (*) 3.5 - 5.2 (g/dL)   AST 17  0 - 37 (U/L)   ALT 18  0 - 53 (U/L)   Alkaline Phosphatase 71  39 - 117 (U/L)   Total Bilirubin 0.2 (*) 0.3 - 1.2 (mg/dL)   GFR calc non Af Amer >90  >90 (mL/min)   GFR calc Af Amer >90  >90 (mL/min)  DIFFERENTIAL     Status: Normal   Collection Time   07/31/11  3:18 AM      Component Value Range   Neutrophils Relative 43  43 - 77 (%)   Neutro Abs 3.1  1.7 - 7.7 (K/uL)   Lymphocytes Relative 45  12 - 46 (%)   Lymphs Abs 3.3  0.7 - 4.0 (K/uL)   Monocytes Relative 8  3 - 12 (%)   Monocytes Absolute 0.6  0.1 - 1.0 (K/uL)   Eosinophils Relative 3  0 - 5 (%)   Eosinophils Absolute 0.2  0.0 - 0.7 (K/uL)   Basophils Relative 1  0 - 1 (%)   Basophils Absolute 0.1  0.0 - 0.1 (K/uL)    Disposition and follow-up:   Brent Wallace was discharged from in good condition.    Follow-up Appointments: Discharge Orders    Future Appointments: Provider: Department: Dept Phone: Center:   08/20/2011 9:00 AM [facility] [phone] [facility]   08/27/2011 10:00 AM Evette Georges, MD [facility] [phone] [facility]     Future Orders Please Complete By Expires   Diet - low sodium heart healthy      Increase activity slowly      No wound care      Call MD for:  persistant nausea and vomiting      Call MD for:  severe uncontrolled pain      Call MD for:  persistant dizziness or light-headedness         Discharge Medications: Medication List  As of 07/31/2011 10:23 AM   STOP taking these medications         DULoxetine 60 MG capsule      indomethacin 75 MG CR capsule          TAKE these medications         acetaminophen 325 MG tablet   Commonly known as: TYLENOL   Take 2  tablets (650 mg total) by mouth every 6 (six) hours as needed (pain).      amphetamine-dextroamphetamine 25 MG 24 hr capsule   Commonly known as: ADDERALL XR   Take 1 capsule (25 mg total) by mouth every morning.      lisinopril-hydrochlorothiazide 10-12.5 MG per tablet   Commonly known as: PRINZIDE,ZESTORETIC   Take 1 tablet by mouth daily. 1 daily every morning      metFORMIN 500 MG tablet   Commonly known as: GLUCOPHAGE   Take 1 tablet (500 mg total) by mouth 2 (two) times daily.      oxyCODONE-acetaminophen 7.5-500 MG per tablet   Commonly known as: PERCOCET   Take 1 tablet by mouth every 4 (four) hours as needed for pain.      pantoprazole 40 MG tablet   Commonly known as: PROTONIX   Take 1 tablet (40 mg total) by mouth 2 (two) times daily before a meal.      peg 3350 powder 100 G Solr   Commonly known as: MOVIPREP   Take 1 kit (100 g total) by mouth as directed. See written handout      sildenafil 50 MG tablet   Commonly known as: VIAGRA   Take 1 tablet (50 mg total) by mouth as directed.           Medications Discontinued During This Encounter  Medication Reason  . indomethacin (INDOCIN) 25 MG capsule Error  . methylphenidate (RITALIN) 20 MG tablet Error  . vitamin B-12 (CYANOCOBALAMIN) 500 MCG tablet Error  . amphetamine-dextroamphetamine (ADDERALL XR) 24 hr capsule 25 mg Formulary change  . indomethacin (INDOCIN) capsule 25 mg   . methylphenidate (RITALIN) tablet 20 mg Entry Error  . fentaNYL NICU IV Syringe 50 mcg/mL Patient Discharge  . midazolam (VERSED) injection Patient Discharge  . glycopyrrolate (ROBINUL) injection Patient Discharge  . butamben-tetracaine-benzocaine (CETACAINE) spray Patient Discharge  . DULoxetine (CYMBALTA) 60 MG capsule   . 0.9 %  sodium chloride infusion   . EPINEPHrine 1:10,000, 10 mL syringe/NS, 10 mL vial for sclerotherapy  inj mixture   . pantoprazole (PROTONIX) injection 40 mg   . indomethacin (INDOCIN SR) 75 MG CR capsule Stop Taking at Discharge  . peg 3350 powder (MOVIPREP) 100 G SOLR Stop Taking at Discharge    Signed: [facility] 07/31/2011, 10:23 AM

## 2011-08-13 ENCOUNTER — Telehealth: Payer: Self-pay | Admitting: Gastroenterology

## 2011-08-13 NOTE — Telephone Encounter (Signed)
Pt wife is calling because he was seen by Dr Arlyce Dice in the hospital who treated him for bleeding ulcer and was told by Dr Arlyce Dice that he cold remain on his cymbalta for leg and back pain, however his discharge instruction say he should discontinue the cymbalta.  Should he stay off or can he restart because it is the only thing that's helps the pain?

## 2011-08-14 NOTE — Telephone Encounter (Signed)
Pt aware.

## 2011-08-14 NOTE — Telephone Encounter (Signed)
Probably safe to restart the cymbalta but should NOT restart the indomethacin.

## 2011-08-20 ENCOUNTER — Other Ambulatory Visit (INDEPENDENT_AMBULATORY_CARE_PROVIDER_SITE_OTHER): Payer: Commercial Managed Care - PPO

## 2011-08-20 ENCOUNTER — Other Ambulatory Visit: Payer: Commercial Managed Care - PPO

## 2011-08-20 ENCOUNTER — Encounter: Payer: Self-pay | Admitting: Gastroenterology

## 2011-08-20 ENCOUNTER — Ambulatory Visit (INDEPENDENT_AMBULATORY_CARE_PROVIDER_SITE_OTHER): Payer: Commercial Managed Care - PPO | Admitting: Gastroenterology

## 2011-08-20 VITALS — BP 148/82 | HR 64 | Ht 70.0 in | Wt 359.2 lb

## 2011-08-20 DIAGNOSIS — K274 Chronic or unspecified peptic ulcer, site unspecified, with hemorrhage: Secondary | ICD-10-CM

## 2011-08-20 LAB — CBC WITH DIFFERENTIAL/PLATELET
Basophils Absolute: 0 10*3/uL (ref 0.0–0.1)
Eosinophils Absolute: 0.2 10*3/uL (ref 0.0–0.7)
Lymphocytes Relative: 43.1 % (ref 12.0–46.0)
MCHC: 33 g/dL (ref 30.0–36.0)
Monocytes Absolute: 0.3 10*3/uL (ref 0.1–1.0)
Neutro Abs: 2.5 10*3/uL (ref 1.4–7.7)
Neutrophils Relative %: 46.8 % (ref 43.0–77.0)
RDW: 15.3 % — ABNORMAL HIGH (ref 11.5–14.6)

## 2011-08-20 MED ORDER — PANTOPRAZOLE SODIUM 40 MG PO TBEC: 40.0000 mg | DELAYED_RELEASE_TABLET | Freq: Every day | ORAL | Status: DC
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
## 2011-08-20 NOTE — Patient Instructions (Addendum)
One of your biggest health concerns is your smoking.  This increases your risk for most cancers and serious cardiovascular diseases such as strokes, heart attacks.  You should try your best to stop.  If you need assistance, please contact your PCP or Smoking Cessation Class at [facility] [phone]) or [facility] Quit-Line (1-800-QUIT-NOW). Continue to avoid NSAIDs.  If you absolutely must take an NSAID, you should take PPI (such as protonix) at the same time. OK to decrease the protonix to once a day for one month, then every other for one month, then off completely. You will have labs checked today in the basement lab.  Please head down after you check out with the front desk  (chc).

## 2011-08-27 ENCOUNTER — Ambulatory Visit: Payer: Commercial Managed Care - PPO | Admitting: Family Medicine

## 2011-09-03 ENCOUNTER — Ambulatory Visit (INDEPENDENT_AMBULATORY_CARE_PROVIDER_SITE_OTHER): Payer: Commercial Managed Care - PPO | Admitting: Family Medicine

## 2011-09-03 ENCOUNTER — Encounter: Payer: Self-pay | Admitting: Family Medicine

## 2011-09-03 ENCOUNTER — Telehealth: Payer: Self-pay

## 2011-09-03 VITALS — BP 144/100 | Temp 98.8°F | Wt 336.0 lb

## 2011-09-03 DIAGNOSIS — I10 Essential (primary) hypertension: Secondary | ICD-10-CM

## 2011-09-03 DIAGNOSIS — K264 Chronic or unspecified duodenal ulcer with hemorrhage: Secondary | ICD-10-CM

## 2011-09-03 DIAGNOSIS — E119 Type 2 diabetes mellitus without complications: Secondary | ICD-10-CM

## 2011-09-03 LAB — BASIC METABOLIC PANEL
BUN: 15 mg/dL (ref 6–23)
Chloride: 100 mEq/L (ref 96–112)
Creatinine, Ser: 1 mg/dL (ref 0.4–1.5)
Glucose, Bld: 121 mg/dL — ABNORMAL HIGH (ref 70–99)

## 2011-09-03 LAB — MICROALBUMIN / CREATININE URINE RATIO
Creatinine,U: 182.8 mg/dL
Microalb Creat Ratio: 9 mg/g (ref 0.0–30.0)

## 2011-09-03 NOTE — Patient Instructions (Signed)
Continue your current medications  We will call you in a couple days with the report on your lab work to see if we need to make any changes  Remember to take your blood pressure medicine daily  Check your blood pressure in the morning and record it and return in 4 weeks to see me for followup on your blood pressure

## 2011-09-03 NOTE — Telephone Encounter (Signed)
Pt's wife called with concerns of pt having difficulty walking and moving. She would like for him to be seen today

## 2011-09-03 NOTE — Progress Notes (Signed)
  Subjective:    Patient ID: BURNS TIMSON, male    DOB: [birth year], 51 y.o.   MRN: [id number]  HPI hiris  is a 51 year old married male nonsmoker who comes in today for followup of hypertension and diabetes  He's currently on lisinopril 10-12.5 however he didn't take his medicine this morning BP today 150/90 advised to take medicine daily  He's on metformin 500 mg twice a day and he states his blood sugars at home in the 1 03/11/2019 range last A1c 6 months ago normal  He recently was admitted the hospital in February with a GI bleed from indomethacin. He's now off the indomethacin and all NSAIDs. He's been treated by his neurologist with a combination of tramadol and Cymbalta for back and hip pain.  He's lost 30 pounds his weight is down to 336. He knows in order to get his back and leg pain and to diminish he must continue to lose weight.   Review of Systems General and metabolic review of systems otherwise negative    Objective:   Physical Exam  Well-developed well-nourished male in no acute distress BP right arm sitting position 150/90 but again he didn't take his medicine this morning     Assessment & Plan:  Diabetes type 2 continue current therapy check labs  Hypertension not at goal because patient not taking medication daily advised to take medication daily BP check every morning followup in 4 weeks  Backend gait abnormalities refer back to neurologist  Obesity continue diet exercise and weight loss

## 2011-10-16 ENCOUNTER — Ambulatory Visit: Payer: Commercial Managed Care - PPO | Admitting: *Deleted

## 2011-12-26 ENCOUNTER — Other Ambulatory Visit: Payer: Self-pay | Admitting: Family Medicine

## 2011-12-26 MED ORDER — AMPHETAMINE-DEXTROAMPHET ER 25 MG PO CP24: 25.0000 mg | ORAL_CAPSULE | ORAL | Status: DC

## 2011-12-26 NOTE — Telephone Encounter (Signed)
Pt needs new rx adderall xr 25 mg °

## 2011-12-27 NOTE — Telephone Encounter (Signed)
rx ready for pick up. Left message on machine for patient. 

## 2012-02-20 ENCOUNTER — Encounter: Payer: Self-pay | Admitting: Family Medicine

## 2012-02-20 ENCOUNTER — Ambulatory Visit (INDEPENDENT_AMBULATORY_CARE_PROVIDER_SITE_OTHER): Payer: Commercial Managed Care - PPO | Admitting: Family Medicine

## 2012-02-20 VITALS — BP 130/88 | Temp 98.8°F | Wt 365.0 lb

## 2012-02-20 DIAGNOSIS — M5416 Radiculopathy, lumbar region: Secondary | ICD-10-CM | POA: Insufficient documentation

## 2012-02-20 DIAGNOSIS — E119 Type 2 diabetes mellitus without complications: Secondary | ICD-10-CM

## 2012-02-20 DIAGNOSIS — IMO0002 Reserved for concepts with insufficient information to code with codable children: Secondary | ICD-10-CM

## 2012-02-20 DIAGNOSIS — F909 Attention-deficit hyperactivity disorder, unspecified type: Secondary | ICD-10-CM

## 2012-02-20 DIAGNOSIS — N529 Male erectile dysfunction, unspecified: Secondary | ICD-10-CM

## 2012-02-20 DIAGNOSIS — I10 Essential (primary) hypertension: Secondary | ICD-10-CM

## 2012-02-20 DIAGNOSIS — E669 Obesity, unspecified: Secondary | ICD-10-CM

## 2012-02-20 DIAGNOSIS — G35 Multiple sclerosis: Secondary | ICD-10-CM

## 2012-02-20 DIAGNOSIS — M48061 Spinal stenosis, lumbar region without neurogenic claudication: Secondary | ICD-10-CM

## 2012-02-20 LAB — CBC WITH DIFFERENTIAL/PLATELET
Basophils Absolute: 0.1 10*3/uL (ref 0.0–0.1)
Basophils Relative: 0.7 % (ref 0.0–3.0)
Eosinophils Absolute: 0.1 10*3/uL (ref 0.0–0.7)
HCT: 46.9 % (ref 39.0–52.0)
Hemoglobin: 15.6 g/dL (ref 13.0–17.0)
Lymphs Abs: 2.8 10*3/uL (ref 0.7–4.0)
MCHC: 33.2 g/dL (ref 30.0–36.0)
MCV: 89.4 fl (ref 78.0–100.0)
Monocytes Absolute: 0.5 10*3/uL (ref 0.1–1.0)
Neutro Abs: 4.4 10*3/uL (ref 1.4–7.7)
RBC: 5.25 Mil/uL (ref 4.22–5.81)
RDW: 14.8 % — ABNORMAL HIGH (ref 11.5–14.6)

## 2012-02-20 LAB — BASIC METABOLIC PANEL
BUN: 13 mg/dL (ref 6–23)
CO2: 27 mEq/L (ref 19–32)
Calcium: 9.3 mg/dL (ref 8.4–10.5)
Chloride: 100 mEq/L (ref 96–112)
Creatinine, Ser: 1.1 mg/dL (ref 0.4–1.5)
GFR: 93.11 mL/min (ref >60.00)
Glucose, Bld: 146 mg/dL — ABNORMAL HIGH (ref 70–99)
Potassium: 4 mEq/L (ref 3.5–5.1)
Sodium: 137 mEq/L (ref 135–145)

## 2012-02-20 LAB — HEMOGLOBIN A1C: Hgb A1c MFr Bld: 7.5 % — ABNORMAL HIGH (ref 4.6–6.5)

## 2012-02-20 LAB — MICROALBUMIN / CREATININE URINE RATIO
Creatinine,U: 225.2 mg/dL
Microalb Creat Ratio: 2.5 mg/g (ref 0.0–30.0)
Microalb, Ur: 5.6 mg/dL — ABNORMAL HIGH (ref 0.0–1.9)

## 2012-02-20 MED ORDER — SILDENAFIL CITRATE 100 MG PO TABS: 50.0000 mg | ORAL_TABLET | Freq: Every day | ORAL | Status: DC | PRN

## 2012-02-20 NOTE — Patient Instructions (Signed)
Labs today and I will call you next week with the report  I would strongly consider the epidural steroid injections  Begin D. caloric counting as we discussed,,,,,,,,,,,lose it...........  Continue your current medications for now

## 2012-02-20 NOTE — Progress Notes (Signed)
  Subjective:    Patient ID: Brent Wallace, male    DOB: 1960/09/21, 51 y.o.   MRN: [id number]  HPI Brent Wallace is a 51 year old married male nonsmoker who comes in today for followup of diabetes type 2  He had a GI bleed and was hospitalized this year from taking NSAIDs. He was taking NSAIDs because of chronic low back pain. His neurologist now has determined that he has spinal stenosis and they recommend some epidural steroid injections. He was again advised not to take any aspirin or aspirin products. He takes tramadol 50 mg 3 times daily for pain  He also takes Adderall 25 mg long-acting daily for adult ADD, Cymbalta 60 mg a day for mild depression, lisinopril 10-12.5 daily for hypertension, Protonix 40 mg daily for reflux.  He also takes metformin 500 mg twice a day for diabetes.  His weight is 365 pounds. He's not on an exercise program last A1c was in March   Review of Systems Gen. review of systems otherwise negative    Objective:   Physical Exam  Well-developed well-nourished male no acute distress weight 365 pounds BP 130/88      Assessment & Plan:  Diabetes type 2 check labs  Metabolic syndrome again encouraged to diet exercise and weight loss  Spinal stenosis would recommend some epidural steroid injections  History GI bleed avoid all NSAIDs for ever

## 2012-05-12 ENCOUNTER — Ambulatory Visit (INDEPENDENT_AMBULATORY_CARE_PROVIDER_SITE_OTHER): Payer: 59 | Admitting: Pulmonary Disease

## 2012-05-12 ENCOUNTER — Encounter: Payer: Self-pay | Admitting: Pulmonary Disease

## 2012-05-12 VITALS — BP 144/98 | HR 88 | Temp 98.7°F | Ht 71.0 in | Wt 370.8 lb

## 2012-05-12 DIAGNOSIS — Z9989 Dependence on other enabling machines and devices: Secondary | ICD-10-CM

## 2012-05-12 DIAGNOSIS — G4733 Obstructive sleep apnea (adult) (pediatric): Secondary | ICD-10-CM

## 2012-05-12 NOTE — Patient Instructions (Addendum)
Will get you a new machine and supplies Work on weight loss followup with me in one year if doing well.

## 2012-05-12 NOTE — Assessment & Plan Note (Signed)
The patient has a long-standing history of very severe obstructive sleep apnea, and has been on CPAP successfully since that time.  I have not seen him since 2006, and he is using the same machine from 2004.  Not only is he having issues with his machine, but he is overdue for supplies and a mask.  He feels that he is sleeping well with the machine, and has no issues with his alertness during the day.  He has gained 41 pounds since the last visit, and he may have to have his pressure optimized again if he does not feel he is resting well on the new device.  Will give him a new CPAP machine, and update of all of his supplies.  I have strongly encouraged him to work aggressively on weight loss, and he is to call if he feels he is not doing well.
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
## 2012-05-13 ENCOUNTER — Encounter: Payer: Self-pay | Admitting: Pulmonary Disease

## 2012-06-01 ENCOUNTER — Other Ambulatory Visit: Payer: Self-pay | Admitting: *Deleted

## 2012-06-01 DIAGNOSIS — IMO0002 Reserved for concepts with insufficient information to code with codable children: Secondary | ICD-10-CM

## 2012-06-01 MED ORDER — LISINOPRIL-HYDROCHLOROTHIAZIDE 10-12.5 MG PO TABS: 1.0000 | ORAL_TABLET | Freq: Every day | ORAL | Status: DC

## 2012-06-27 ENCOUNTER — Encounter: Payer: Self-pay | Admitting: Internal Medicine

## 2012-06-27 ENCOUNTER — Ambulatory Visit (INDEPENDENT_AMBULATORY_CARE_PROVIDER_SITE_OTHER): Payer: 59 | Admitting: Internal Medicine

## 2012-06-27 VITALS — BP 150/98 | HR 78 | Temp 98.4°F | Wt 371.0 lb

## 2012-06-27 DIAGNOSIS — H00019 Hordeolum externum unspecified eye, unspecified eyelid: Secondary | ICD-10-CM

## 2012-06-27 MED ORDER — DOXYCYCLINE HYCLATE 100 MG PO TABS: 100.0000 mg | ORAL_TABLET | Freq: Two times a day (BID) | ORAL | Status: DC

## 2012-06-27 MED ORDER — ERYTHROMYCIN 5 MG/GM OP OINT: TOPICAL_OINTMENT | Freq: Four times a day (QID) | OPHTHALMIC | Status: DC

## 2012-06-27 NOTE — Assessment & Plan Note (Signed)
Doxy Erythro ophth oint Warm compress

## 2012-06-27 NOTE — Progress Notes (Signed)
  Subjective:    Patient ID: Brent Wallace, male    DOB: Nov 13, 1960, 52 y.o.   MRN: [id number]  HPI  C/o a stye R eye x 4 d - worse. It was draining  Review of Systems  Constitutional: Negative for appetite change, fatigue and unexpected weight change.  HENT: Negative for nosebleeds, congestion, sore throat, sneezing, trouble swallowing and neck pain.   Eyes: Positive for pain. Negative for itching and visual disturbance.  Respiratory: Negative for cough.   Cardiovascular: Negative for chest pain, palpitations and leg swelling.  Gastrointestinal: Negative for nausea, diarrhea, blood in stool and abdominal distention.  Genitourinary: Negative for frequency and hematuria.  Musculoskeletal: Negative for back pain, joint swelling and gait problem.  Skin: Negative for rash.  Neurological: Negative for dizziness, tremors, speech difficulty and weakness.  Psychiatric/Behavioral: Negative for sleep disturbance, dysphoric mood and agitation. The patient is not nervous/anxious.        Objective:   Physical Exam  Constitutional: He is oriented to person, place, and time. He appears well-developed.  HENT:  Mouth/Throat: Oropharynx is clear and moist.  Eyes: Conjunctivae normal are normal. Pupils are equal, round, and reactive to light. Right eye exhibits no discharge. Left eye exhibits discharge.       R lower eyelid medial 1/3 3 mm stye  Neck: Normal range of motion. No JVD present. No thyromegaly present.  Cardiovascular: Normal rate, regular rhythm, normal heart sounds and intact distal pulses.  Exam reveals no gallop and no friction rub.   No murmur heard. Pulmonary/Chest: Effort normal and breath sounds normal. No respiratory distress. He has no wheezes. He has no rales. He exhibits no tenderness.  Abdominal: Soft. Bowel sounds are normal. He exhibits no distension and no mass. There is no tenderness. There is no rebound and no guarding.  Musculoskeletal: Normal range of motion. He  exhibits no edema and no tenderness.  Lymphadenopathy:    He has no cervical adenopathy.  Neurological: He is alert and oriented to person, place, and time. He has normal reflexes. No cranial nerve deficit. He exhibits normal muscle tone. Coordination normal.  Skin: Skin is warm and dry. No rash noted.  Psychiatric: He has a normal mood and affect. His behavior is normal. Judgment and thought content normal.          Assessment & Plan:

## 2012-06-28 ENCOUNTER — Encounter: Payer: Self-pay | Admitting: Internal Medicine

## 2012-06-29 ENCOUNTER — Telehealth: Payer: Self-pay | Admitting: Family Medicine

## 2012-06-29 NOTE — Telephone Encounter (Signed)
[facility] Triage Call Report Triage Record Num: [id number] Operator: Hyman Bower Patient Name: Durrell Barajas Call Date & Time: 06/27/2012 8:43:49AM Patient Phone: [phone] PCP: Eugenio Hoes. Todd Patient Gender: Male PCP Fax : [phone] Patient DOB: 09/29/1960 [facility] Name[facility] Reason for Call: Caller: Shirley/Spouse; PCP: Kelle [facility]); CB#: [phone]; Reports right eye is reddened and swollen. Reports sclera and eyelid is reddened. Reports that it began as a stye. They have used warm compresses with no relief. This morning the eye was "matted shut" and is swollen. Reports sensitivity to light. Triaged per Eye: Infection or Irritation guideline, disposition: see provider within 4 hours for "New onset of severe sensitivity to light or glare, eye pain, reduced vision, tearing or discharge." Appointment scheduled at [facility] office, 06/27/12 at 10:30am with Dr. Posey Rea. Call back parameters given per guideline. Caller verbalized understanding. Protocol(s) Used: Eye: Infection or Irritation Recommended Outcome per Protocol: See Provider within 4 hours Reason for Outcome: New onset of severe sensitivity to light or glare (photophobia), eye pain, reduced vision, tearing, or discharge. Care Advice: ~ Call provider if symptoms worsen or new symptoms develop. 01/18/

## 2012-07-09 ENCOUNTER — Encounter: Payer: Self-pay | Admitting: Family Medicine

## 2012-07-09 ENCOUNTER — Ambulatory Visit (INDEPENDENT_AMBULATORY_CARE_PROVIDER_SITE_OTHER): Payer: 59 | Admitting: Family Medicine

## 2012-07-09 VITALS — BP 120/90 | Temp 98.9°F | Wt 372.0 lb

## 2012-07-09 DIAGNOSIS — E669 Obesity, unspecified: Secondary | ICD-10-CM

## 2012-07-09 DIAGNOSIS — M48061 Spinal stenosis, lumbar region without neurogenic claudication: Secondary | ICD-10-CM

## 2012-07-09 DIAGNOSIS — G35 Multiple sclerosis: Secondary | ICD-10-CM

## 2012-07-09 DIAGNOSIS — I10 Essential (primary) hypertension: Secondary | ICD-10-CM

## 2012-07-09 DIAGNOSIS — IMO0002 Reserved for concepts with insufficient information to code with codable children: Secondary | ICD-10-CM

## 2012-07-09 DIAGNOSIS — F909 Attention-deficit hyperactivity disorder, unspecified type: Secondary | ICD-10-CM

## 2012-07-09 DIAGNOSIS — E119 Type 2 diabetes mellitus without complications: Secondary | ICD-10-CM

## 2012-07-09 LAB — CBC WITH DIFFERENTIAL/PLATELET
Basophils Absolute: 0 10*3/uL (ref 0.0–0.1)
Eosinophils Absolute: 0.2 10*3/uL (ref 0.0–0.7)
Lymphocytes Relative: 36.1 % (ref 12.0–46.0)
MCHC: 32.9 g/dL (ref 30.0–36.0)
Monocytes Relative: 8.4 % (ref 3.0–12.0)
Neutro Abs: 3.7 10*3/uL (ref 1.4–7.7)
Platelets: 198 10*3/uL (ref 150.0–400.0)
RDW: 14.9 % — ABNORMAL HIGH (ref 11.5–14.6)

## 2012-07-09 LAB — BASIC METABOLIC PANEL
BUN: 13 mg/dL (ref 6–23)
CO2: 27 mEq/L (ref 19–32)
Calcium: 9.2 mg/dL (ref 8.4–10.5)
Chloride: 100 mEq/L (ref 96–112)
Creatinine, Ser: 0.9 mg/dL (ref 0.4–1.5)

## 2012-07-09 LAB — MICROALBUMIN / CREATININE URINE RATIO
Creatinine,U: 137.4 mg/dL
Microalb Creat Ratio: 25.1 mg/g (ref 0.0–30.0)

## 2012-07-09 LAB — HEMOGLOBIN A1C: Hgb A1c MFr Bld: 8.7 % — ABNORMAL HIGH (ref 4.6–6.5)

## 2012-07-09 NOTE — Patient Instructions (Signed)
Continue the metformin one twice daily  Check a fasting blood sugar Monday Wednesday Friday  I will call you with your lab report  Continue your other medications  We will discuss followup timing after we see your lab work

## 2012-07-09 NOTE — Progress Notes (Signed)
  Subjective:    Patient ID: JHAMIR PICKUP, male    DOB: 1961-04-26, 52 y.o.   MRN: [id number]  HPI : This is a 52 year old married male nonsmoker who comes in today for diabetes followup  His blood sugars have been ranging from 98-170. He checks his blood sugar twice weekly and takes metformin 500 mg twice a day  His weight has gone up to 372 pounds. He can't diet or exercise. He has MS and spinal stenosis.  He had one epidural steroid injection and was due to have a second. The first injection helped for about 3 weeks. However his co-pay has gone up to $2500. He purchased an inversion table and has been converted himself daily and states his spinal stenosis is better.  He was taking long-acting Adderall for ADD however he cannot afford it anymore. He took the short acting but it caused side effects.  Blood pressure 120/90 on Zestoretic 20-12.5 daily   Review of Systems General review of systems otherwise negative    Objective:   Physical Exam Well-developed overweight male no acute distress       Assessment & Plan:  Diabetes type 2 check labs  Hypertension at goal continue current therapy  Adult ADD no medication for now  Spinal stenosis continue inversion table

## 2012-08-17 ENCOUNTER — Other Ambulatory Visit: Payer: Self-pay | Admitting: *Deleted

## 2012-08-17 MED ORDER — METFORMIN HCL 500 MG PO TABS: 500.0000 mg | ORAL_TABLET | Freq: Two times a day (BID) | ORAL | Status: DC

## 2012-08-28 ENCOUNTER — Encounter: Payer: Self-pay | Admitting: Family Medicine

## 2012-08-28 ENCOUNTER — Telehealth: Payer: Self-pay | Admitting: Family Medicine

## 2012-08-28 ENCOUNTER — Ambulatory Visit (INDEPENDENT_AMBULATORY_CARE_PROVIDER_SITE_OTHER): Payer: 59 | Admitting: Family Medicine

## 2012-08-28 VITALS — BP 136/76 | HR 84 | Temp 98.4°F | Wt 370.0 lb

## 2012-08-28 DIAGNOSIS — E119 Type 2 diabetes mellitus without complications: Secondary | ICD-10-CM

## 2012-08-28 LAB — GLUCOSE, POCT (MANUAL RESULT ENTRY): POC Glucose: 316 mg/dl — AB (ref 70–99)

## 2012-08-28 MED ORDER — METFORMIN HCL 500 MG PO TABS: 1000.0000 mg | ORAL_TABLET | Freq: Two times a day (BID) | ORAL | Status: DC
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
## 2012-08-28 NOTE — Patient Instructions (Addendum)
-  increase metformin 1000mg  in the morning and 500mg  at night for 1 week Then increase to 1000mg  twice daily every day of the metformin  VERY IMPORTANT: -exercise 30 minutes 4-5 times per week (water aerobics, chair exercises, stationary bike) -work on diet  Keep log of fasting blood sugars  See diabetes educator  Check feet daily  See eye doctor  Follow up with your doctor in 1-2 months to check hemoglobin A1c

## 2012-08-28 NOTE — Telephone Encounter (Signed)
Patient Information:  Caller Name: Brent Wallace  Phone: [phone]  Patient: Brent Wallace, Brent Wallace  Gender: Male  DOB: 1961/02/29  Age: 52 Years  PCP: Kelle Darting [facility])  Office Follow Up:  Does the office need to follow up with this patient?: No  Instructions For The Office: N/A   Symptoms  Reason For Call & Symptoms: Pt has been c/o dizziness intermittently since Wed. HIs sugar was 345 at that time. He had been without his Metformin ( he takes 500mg  BID ) for a couple of days/pt had run out and didn't take it. He doesn't normallly measure his blood sugar unless he has symptoms. When he is taking his Meformin - he was taking 1/day and would forget the 2nd dose. If he is taking it he runs in the 140s. The blood sugar has never been this high. Last night it was 254 before evening meal and this am fasting his blood sugar was 318. It is now 77 which is 2 hours after he ate. Pt has since taken his Metformin 1000mg  in am so he would get his total mg strength in. He is not dizzy today. Pt has no other signs of illness  Reviewed Health History In EMR: Yes  Reviewed Medications In EMR: Yes  Reviewed Allergies In EMR: Yes  Reviewed Surgeries / Procedures: Yes  Date of Onset of Symptoms: 08/26/2012  Guideline(s) Used:  Diabetes - High Blood Sugar  Disposition Per Guideline:   See Today in Office  Reason For Disposition Reached:   Patient wants to be seen  Advice Given:  N/A  Patient Will Follow Care Advice:  YES  Appointment Scheduled:  08/28/2012 14:45:00 Appointment Scheduled Provider:  Kriste Basque (Family Practice)

## 2012-08-28 NOTE — Addendum Note (Signed)
Addended by: Thomasena Edis on: 08/28/2012 03:41 PM   Modules accepted: Orders

## 2012-10-26 ENCOUNTER — Ambulatory Visit (INDEPENDENT_AMBULATORY_CARE_PROVIDER_SITE_OTHER): Payer: 59 | Admitting: Family Medicine

## 2012-10-26 ENCOUNTER — Encounter: Payer: Self-pay | Admitting: Family Medicine

## 2012-10-26 ENCOUNTER — Telehealth: Payer: Self-pay | Admitting: Family Medicine

## 2012-10-26 VITALS — BP 180/100 | Temp 98.9°F | Wt 360.0 lb

## 2012-10-26 DIAGNOSIS — IMO0002 Reserved for concepts with insufficient information to code with codable children: Secondary | ICD-10-CM

## 2012-10-26 DIAGNOSIS — E119 Type 2 diabetes mellitus without complications: Secondary | ICD-10-CM

## 2012-10-26 MED ORDER — METFORMIN HCL 500 MG PO TABS: ORAL_TABLET | ORAL | Status: DC

## 2012-10-26 MED ORDER — GLIPIZIDE 5 MG PO TABS: 5.0000 mg | ORAL_TABLET | Freq: Two times a day (BID) | ORAL | Status: DC

## 2012-10-26 NOTE — Progress Notes (Signed)
  Subjective:    Patient ID: Brent Wallace, male    DOB: 12-20-1960, 52 y.o.   MRN: [id number]  HPI Brent Wallace is a 52 year old married male nonsmoker who comes in today for evaluation of hypertension and diabetes  He states he forgot to take his blood pressure medication this morning. BP when he came in the door was 180/100 after resting for 15 minutes recheck BP by me 150/90 right arm sitting position  He states he's not been compliant with his blood sugar medication also. He states the metformin gives and diarrhea once or twice per week.  He's working with his neurologist in [place].   Review of Systems    review of systems otherwise negative Objective:   Physical Exam Well-developed and nourished male no acute distress BP right arm sitting position 150/90 pulse 70 and regular       Assessment & Plan:  Hypertension not at goal because of patient noncompliance with taking medication,,,,,, take your blood pressure daily in the morning BP check daily for in the morning followup in 3 weeks  Diabetes type 2 not at goal decrease metformin to 500 twice a day and Glucotrol 5 mg twice a day fasting blood sugar daily followup in 2 weeks

## 2012-10-26 NOTE — Telephone Encounter (Signed)
Noted. Left message on machine for wife that note will be added to today's office visit

## 2012-10-26 NOTE — Patient Instructions (Signed)
Take the Zestoretic one tablet now and you get home then starting tomorrow morning one tablet daily in the morning  Glucotrol 5 mg and metformin 500 mg,,,,,,,,,,,,, one of each prior to breakfast and one of each prior to your evening male  Check a fasting blood sugar and blood pressure daily in the morning  Return in 3 weeks for followup  I would recommend the folks at [facility] if you need neurosurgery

## 2012-10-26 NOTE — Telephone Encounter (Signed)
PT wife called because she would not be able to make it to the patient appt this afternoon. She stated that she would like to inform Dr. Tawanna Cooler of a few things, in fear that the PT would not. She stated that his sugar was between the high 160's-170's this weekend, but that it was over 200 in the weeks prior. She also stated that he is only taking 1 dose of his Metformin, due to the fact that it causes bowl movements immediately. She also wanted to bring to the doctors attention, all the trouble that he was having with his back. Please assist.

## 2012-11-16 ENCOUNTER — Ambulatory Visit: Payer: 59 | Admitting: Family Medicine

## 2012-11-16 DIAGNOSIS — Z0289 Encounter for other administrative examinations: Secondary | ICD-10-CM

## 2013-02-12 ENCOUNTER — Telehealth: Payer: Self-pay | Admitting: Family Medicine

## 2013-02-12 DIAGNOSIS — M48061 Spinal stenosis, lumbar region without neurogenic claudication: Secondary | ICD-10-CM

## 2013-02-12 NOTE — Telephone Encounter (Signed)
1. Pt has mole on scrotum. Will Dr Tawanna Cooler remove or does he need a referral? 2.Pt would like referral to neuro surgeon. Pt states Dr Tawanna Cooler knows what for...spinal stenosis and difficulty walking. 3. Pt would like to know if Dr Tawanna Cooler will call in diabetic strips for microtouch ultra one touch.   Pt has bought own meter, but would gladly take one of ours if we could provide rx for the strips.   They said to tell Dr Tawanna Cooler they are thinking of him and family and hope to see him soon.

## 2013-02-15 MED ORDER — GLUCOSE BLOOD VI STRP: ORAL_STRIP | Status: DC

## 2013-02-15 NOTE — Telephone Encounter (Signed)
Wife is aware and order, rx sent

## 2013-03-15 ENCOUNTER — Other Ambulatory Visit: Payer: Self-pay | Admitting: Family Medicine

## 2013-04-06 ENCOUNTER — Ambulatory Visit (INDEPENDENT_AMBULATORY_CARE_PROVIDER_SITE_OTHER): Payer: 59 | Admitting: Family Medicine

## 2013-04-06 ENCOUNTER — Encounter: Payer: Self-pay | Admitting: Family Medicine

## 2013-04-06 VITALS — BP 140/98 | Temp 98.8°F | Wt 366.0 lb

## 2013-04-06 DIAGNOSIS — M48061 Spinal stenosis, lumbar region without neurogenic claudication: Secondary | ICD-10-CM

## 2013-04-06 DIAGNOSIS — IMO0002 Reserved for concepts with insufficient information to code with codable children: Secondary | ICD-10-CM

## 2013-04-06 DIAGNOSIS — E119 Type 2 diabetes mellitus without complications: Secondary | ICD-10-CM

## 2013-04-06 DIAGNOSIS — Z23 Encounter for immunization: Secondary | ICD-10-CM

## 2013-04-06 DIAGNOSIS — N529 Male erectile dysfunction, unspecified: Secondary | ICD-10-CM

## 2013-04-06 DIAGNOSIS — I10 Essential (primary) hypertension: Secondary | ICD-10-CM

## 2013-04-06 LAB — POCT URINALYSIS DIPSTICK
Leukocytes, UA: NEGATIVE
Nitrite, UA: NEGATIVE
Urobilinogen, UA: 1
pH, UA: 6.5

## 2013-04-06 LAB — CBC WITH DIFFERENTIAL/PLATELET
Basophils Absolute: 0.1 10*3/uL (ref 0.0–0.1)
HCT: 47.8 % (ref 39.0–52.0)
Lymphs Abs: 2.6 10*3/uL (ref 0.7–4.0)
MCV: 88.1 fl (ref 78.0–100.0)
Monocytes Absolute: 0.5 10*3/uL (ref 0.1–1.0)
Neutrophils Relative %: 54.3 % (ref 43.0–77.0)
Platelets: 225 10*3/uL (ref 150.0–400.0)
RDW: 14.3 % (ref 11.5–14.6)
WBC: 7.2 10*3/uL (ref 4.5–10.5)

## 2013-04-06 LAB — HEMOGLOBIN A1C: Hgb A1c MFr Bld: 8.9 % — ABNORMAL HIGH (ref 4.6–6.5)

## 2013-04-06 LAB — BASIC METABOLIC PANEL
Calcium: 9.4 mg/dL (ref 8.4–10.5)
GFR: 122.58 mL/min (ref >60.00)
Potassium: 3.9 mEq/L (ref 3.5–5.1)

## 2013-04-06 MED ORDER — SILDENAFIL CITRATE 100 MG PO TABS: ORAL_TABLET | ORAL | Status: DC

## 2013-04-06 MED ORDER — GLIPIZIDE 5 MG PO TABS: 5.0000 mg | ORAL_TABLET | Freq: Two times a day (BID) | ORAL | Status: DC

## 2013-04-06 MED ORDER — METFORMIN HCL 500 MG PO TABS: ORAL_TABLET | ORAL | Status: DC

## 2013-04-06 MED ORDER — LISINOPRIL-HYDROCHLOROTHIAZIDE 10-12.5 MG PO TABS: 1.0000 | ORAL_TABLET | Freq: Every day | ORAL | Status: DC

## 2013-04-06 NOTE — Progress Notes (Signed)
  Subjective:    Patient ID: Brent Wallace, male    DOB: [birth year], 53 y.o.   MRN: [id number]  HPI Brent Wallace is a 52 year old married male nonsmoker who comes in today for multiple issues  His weight is 366 pounds and he has diabetes type 2. He's been on metformin 500 mg twice a day glipizide 5 mg twice a day he checks his blood sugar 3 times weekly at home. He says his blood sugars range from 134 to a high of 144. Last A1c January 2014 was 8.7%.  He states his wife who is a PA monitors his blood pressure at home. BP today 140/98. He's on Zestoretic 20-12.5 daily.  He uses Viagra when necessary for ED  He's also on Cymbalta from his neurologist in [facility]. He does have spinal stenosis. He's due to see our neurosurgeons here in town soon for evaluation. He also tells me he's been in contact with a spinal surgery group in [place] but does laser treatments. I recommended they talk with our folks here before he gets in an airplane and flies to [place]  He also has a couple lesions on his legs that he would like checked.   Review of Systems Review of systems otherwise negative and masses quiet and he's not taking any of his ADD medication anymore    Objective:   Physical Exam  Well-developed well-nourished overweight male no acute distress PPI of sitting position 140/98  Examination lower extremities shows mid right lower anterior tibia a 5 mm lesion consistent with an inclusion cyst. Just lateral to that there is a 10 mm x 10 mm soft fluctuant lesion consistent with a cyst or a varicose vein. There is also lesion of left lateral lower extremity like the one on the right.      Assessment & Plan:  Benign lesions lower extremities observe  Diabetes type 2 check labs  Hypertension monitor BP at home continue blood pressure medication  History of spinal stenosis neurosurgical consult here  Erectile dysfunction continue Viagra when necessary

## 2013-04-06 NOTE — Patient Instructions (Signed)
Goal for your blood pressure is 130/80 or less,,,,,,,,, check your blood pressure daily in the morning for the next 3 weeks to be sure you are at goal. If not at goal call me  Labs today for your blood sugar  Viagra 100 mg when necessary  The lesions on your lower extremities are benign we will observe him for now. Has however they increase in size or you would like them excised this should be done by a general surgeon

## 2013-04-13 ENCOUNTER — Other Ambulatory Visit: Payer: Self-pay | Admitting: Family Medicine

## 2013-04-13 DIAGNOSIS — E1110 Type 2 diabetes mellitus with ketoacidosis without coma: Secondary | ICD-10-CM

## 2013-04-26 ENCOUNTER — Telehealth: Payer: Self-pay | Admitting: Pulmonary Disease

## 2013-04-26 DIAGNOSIS — G4733 Obstructive sleep apnea (adult) (pediatric): Secondary | ICD-10-CM

## 2013-04-26 NOTE — Telephone Encounter (Signed)
Spoke with the pt  He is requesting to switch from [facility] [place] to [place] for CPAP supplies, machine and mask  Order was sent to [facility]  Pt aware to keep appt in Dec  Nothing further needed

## 2013-04-27 ENCOUNTER — Ambulatory Visit (INDEPENDENT_AMBULATORY_CARE_PROVIDER_SITE_OTHER): Payer: 59 | Admitting: Internal Medicine

## 2013-04-27 ENCOUNTER — Telehealth: Payer: Self-pay | Admitting: Pulmonary Disease

## 2013-04-27 ENCOUNTER — Encounter: Payer: Self-pay | Admitting: Internal Medicine

## 2013-04-27 VITALS — BP 124/88 | HR 89 | Temp 98.8°F | Resp 12 | Ht 70.0 in | Wt 371.5 lb

## 2013-04-27 DIAGNOSIS — G4733 Obstructive sleep apnea (adult) (pediatric): Secondary | ICD-10-CM

## 2013-04-27 DIAGNOSIS — E1159 Type 2 diabetes mellitus with other circulatory complications: Secondary | ICD-10-CM

## 2013-04-27 DIAGNOSIS — N529 Male erectile dysfunction, unspecified: Secondary | ICD-10-CM

## 2013-04-27 DIAGNOSIS — E119 Type 2 diabetes mellitus without complications: Secondary | ICD-10-CM

## 2013-04-27 MED ORDER — METFORMIN HCL 500 MG PO TABS: ORAL_TABLET | ORAL | Status: DC

## 2013-04-27 MED ORDER — ONETOUCH DELICA LANCETS 33G MISC: Status: DC
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
## 2013-04-27 NOTE — Telephone Encounter (Signed)
Says that he spoke with [facility] and they did receive the order but they need a rx for cpap supplies.Raylene Everts

## 2013-04-27 NOTE — Telephone Encounter (Signed)
Order has been placed Brent Wallace supervised this). Pt is aware that we will send this order in the AM, due to how late it is in the day. He agreed and verbalized understanding. Nothing further is needed at this time.

## 2013-04-27 NOTE — Patient Instructions (Addendum)
Please return in 1 month with your sugar log.  Please increase metformin to 1000 mg 2x a day with a meal. Continue Glipizide 5 mg 2x a day with a meal. Please try to reduce fatty foods.  We will refer you to a dietician. I refilled your lancets and test strips. Let me know when you need a Metformin refill.  PATIENT INSTRUCTIONS FOR TYPE 2 DIABETES:  **Please join MyChart!** - see attached instructions about how to join   DIET AND EXERCISE Diet and exercise is an important part of diabetic treatment.  We recommended aerobic exercise in the form of brisk walking (working between 40-60% of maximal aerobic capacity, similar to brisk walking) for 150 minutes per week (such as 30 minutes five days per week) along with 3 times per week performing 'resistance' training (using various gauge rubber tubes with handles) 5-10 exercises involving the major muscle groups (upper body, lower body and core) performing 10-15 repetitions (or near fatigue) each exercise. Start at half the above goal but build slowly to reach the above goals. If limited by weight, joint pain, or disability, we recommend daily walking in a swimming pool with water up to waist to reduce pressure from joints while allow for adequate exercise.    BLOOD GLUCOSES Monitoring your blood glucoses is important for continued management of your diabetes. Please check your blood glucoses 2-4 times a day: fasting, before meals and at bedtime (you can rotate these measurements - e.g. one day check before the 3 meals, the next day check before 2 of the meals and before bedtime, etc.   HYPOGLYCEMIA (low blood sugar) Hypoglycemia is usually a reaction to not eating, exercising, or taking too much insulin/ other diabetes drugs.  Symptoms include tremors, sweating, hunger, confusion, headache, etc. Treat IMMEDIATELY with 15 grams of Carbs:   4 glucose tablets    cup regular juice/soda   2 tablespoons raisins   4 teaspoons sugar   1 tablespoon  honey Recheck blood glucose in 15 mins and repeat above if still symptomatic/blood glucose <100. Please contact our office at [phone] if you have questions about how to next handle your insulin.  RECOMMENDATIONS TO REDUCE YOUR RISK OF DIABETIC COMPLICATIONS: * Take your prescribed MEDICATION(S). * Follow a DIABETIC diet: Complex carbs, fiber rich foods, heart healthy fish twice weekly, (monounsaturated and polyunsaturated) fats * AVOID saturated/trans fats, high fat foods, >2,300 mg salt per day. * EXERCISE at least 5 times a week for 30 minutes or preferably daily.  * DO NOT SMOKE OR DRINK more than 1 drink a day. * Check your FEET every day. Do not wear tightfitting shoes. Contact us if you develop an ulcer * See your EYE doctor once a year or more if needed * Get a FLU shot once a year * Get a PNEUMONIA vaccine once before and once after age 12 years  GOALS:  * Your Hemoglobin A1c of <7%  * fasting sugars need to be <130 * after meals sugars need to be <180 (2h after you start eating) * Your Systolic BP should be 140 or lower  * Your Diastolic BP should be 80 or lower  * Your HDL (Good Cholesterol) should be 40 or higher  * Your LDL (Bad Cholesterol) should be 100 or lower  * Your Triglycerides should be 150 or lower  * Your Urine microalbumin (kidney function) should be <30 * Your Body Mass Index should be 25 or lower   We will be glad to help  you achieve these goals. Our telephone number is: [phone].

## 2013-05-12 ENCOUNTER — Ambulatory Visit: Payer: 59 | Admitting: Pulmonary Disease

## 2013-05-17 ENCOUNTER — Ambulatory Visit (INDEPENDENT_AMBULATORY_CARE_PROVIDER_SITE_OTHER): Payer: 59 | Admitting: Pulmonary Disease

## 2013-05-17 ENCOUNTER — Encounter (INDEPENDENT_AMBULATORY_CARE_PROVIDER_SITE_OTHER): Payer: Self-pay

## 2013-05-17 ENCOUNTER — Encounter: Payer: Self-pay | Admitting: Pulmonary Disease

## 2013-05-17 VITALS — BP 132/90 | HR 83 | Temp 98.1°F | Ht 70.0 in | Wt 375.0 lb

## 2013-05-17 DIAGNOSIS — G4733 Obstructive sleep apnea (adult) (pediatric): Secondary | ICD-10-CM

## 2013-05-17 NOTE — Patient Instructions (Signed)
Continue on cpap, and keep up with mask changes and supplies. Work on weight loss followup with me in one year.  

## 2013-05-17 NOTE — Progress Notes (Signed)
   Subjective:    Patient ID: Brent Wallace, male    DOB: [birth year], 52 y.o.   MRN: [id number]  HPI The patient comes in today for followup of his obstructive sleep apnea. He did get a new machine at the last visit, and has done well. He states that he sleeps very well, and denies any daytime sleepiness. His mask will occasionally leak, but overall he is satisfied.   Review of Systems  Constitutional: Negative for fever and unexpected weight change.  HENT: Negative for congestion, dental problem, ear pain, nosebleeds, postnasal drip, rhinorrhea, sinus pressure, sneezing, sore throat and trouble swallowing.   Eyes: Negative for redness and itching.  Respiratory: Negative for cough, chest tightness, shortness of breath and wheezing.   Cardiovascular: Negative for palpitations and leg swelling.  Gastrointestinal: Negative for nausea and vomiting.  Genitourinary: Negative for dysuria.  Musculoskeletal: Negative for joint swelling.  Skin: Negative for rash.  Neurological: Negative for headaches.  Hematological: Does not bruise/bleed easily.  Psychiatric/Behavioral: Negative for dysphoric mood. The patient is not nervous/anxious.        Objective:   Physical Exam Morbidly obese male in no acute distress Nose without purulence or discharge noted No skin breakdown or pressure necrosis from the CPAP mask Neck without lymphadenopathy or thyromegaly Lower extremities with mild edema, no cyanosis Alert, does not appear to be sleepy, moves all 4 extremities.       Assessment & Plan:

## 2013-05-17 NOTE — Assessment & Plan Note (Signed)
The patient is doing very well on CPAP, and feels that he is sleeping well with excellent daytime alertness. I have asked him to continue on CPAP, and to keep up with his mask changes and supplies. I've also encouraged him to work aggressively on weight loss.

## 2013-05-27 ENCOUNTER — Encounter: Payer: Self-pay | Admitting: Internal Medicine

## 2013-05-27 ENCOUNTER — Ambulatory Visit (INDEPENDENT_AMBULATORY_CARE_PROVIDER_SITE_OTHER): Payer: 59 | Admitting: Internal Medicine

## 2013-05-27 VITALS — BP 126/84 | HR 87 | Temp 98.3°F | Resp 12 | Wt 367.0 lb

## 2013-05-27 DIAGNOSIS — E119 Type 2 diabetes mellitus without complications: Secondary | ICD-10-CM

## 2013-05-27 NOTE — Patient Instructions (Signed)
Please return in 1.5 months with your sugar log.  Continue current regimen. Please check sugars daily, at different times of the day.

## 2013-05-27 NOTE — Progress Notes (Signed)
Patient ID: Brent Wallace, male   DOB: 1960/10/05, 52 y.o.   MRN: [id number]  HPI: Brent Wallace is a 52 y.o.-year-old male, returning for f/u for DM2, dx 2009, non-insulin-dependent, uncontrolled, with complications (ED). Last visit 1 mo ago.  Last hemoglobin A1c was: Lab Results  Component Value Date   HGBA1C 8.9* 04/06/2013   HGBA1C 8.7* 07/09/2012   HGBA1C 7.5* 02/20/2012  Had a steroid injection in back in 03/2012 - for spinal stenosis.   Pt is on a regimen of: - Metformin 1000 mg bid >> rarely diarrhea - Glipizide 5 mg bid  Pt checks his sugars 0-1x a day they are improved - per log showing few checks in last month: - am: 123-201, most 140s >> 106-160s - 2h after b'fast: -  - before lunch: -  - 2h after lunch: - - before dinner: - - 2h after dinner: 136 - bedtime: -  No lows. Lowest sugar was 106; ? he has hypoglycemia awareness. Highest sugar was 200 one am.  Pt's meals are: - Breakfast: sometimes skips; when eats: sausage and egg bisquit, or ham and egg bisquit; coffee; OJ sometimes; sometimes pancackes - Lunch: burger + diet drinks; french fries; salad - Dinner: meat + green leafy vegetables + sometimes rice, but sometimes no starch; chicken breast stir fry with mixed veggies; sautees shrimp; pressure cooker meals; seldom fried foods - Snacks: none or corn chips + dip; queso dip  - no CKD, last BUN/creatinine:  Lab Results  Component Value Date   BUN 11 04/06/2013   CREATININE 0.8 04/06/2013  On Lisinopril. Last ACR was 31.4 on 04/06/2013. - last set of lipids: Lab Results  Component Value Date   CHOL 133 02/20/2011   HDL 47.90 02/20/2011   LDLCALC 74 02/20/2011   TRIG 57.0 02/20/2011   CHOLHDL 3 02/20/2011  Not on Statin. - last eye exam was in 2011 (Dr Aura Camps [facility]). No DR. Has optic neuritis in right eye.  - no numbness and tingling in his feet.  I reviewed his chart and he also has a history of OSA - wears CPAP every night; duodenal  ulcer from NSAIDs.  I reviewed pt's medications, allergies, PMH, social hx, family hx and no changes required, except as mentioned above.  ROS: Constitutional: + weight loss, no fatigue Eyes: no blurry vision, no xerophthalmia ENT: no sore throat, no nodules palpated in throat, no dysphagia/odynophagia, no hoarseness Cardiovascular: no CP/SOB/palpitations/leg swelling Respiratory: no cough/SOB Gastrointestinal: no N/V/D/C Musculoskeletal: no muscle/joint aches Skin: no rashes Neurological: no tremors/numbness/tingling/dizziness Low libido, + diffic with erections  PE: BP 126/84  Pulse 87  Temp(Src) 98.3 F (36.8 C) (Oral)  Resp 12  Wt 367 lb (166.47 kg)  SpO2 95% Wt Readings from Last 3 Encounters:  05/27/13 367 lb (166.47 kg)  05/17/13 375 lb (170.099 kg)  04/27/13 371 lb 8 oz (168.511 kg)   Constitutional: obese, in NAD Eyes: PERRLA, EOMI, no exophthalmos ENT: moist mucous membranes, no thyromegaly, no cervical lymphadenopathy; mallampati 4 Cardiovascular: RRR, No MRG Respiratory: CTA B Gastrointestinal: abdomen soft, NT, ND, BS+ Musculoskeletal: no deformities, strength intact in all 4 Skin: moist, warm, no rashes  ASSESSMENT: 1. DM2, non-insulin-dependent, uncontrolled, with complications - Erectile dysfunction  PLAN:  1. Patient with long-standing diabetes, on oral antidiabetic regimen, with apparently improved control recently, although I cannot be sure since he seldom checks sugars and almost all are in am - We discussed about options for treatment, and I suggested  to:  Please return in 1.5 month with your sugar log.  Please increase metformin to 1000 mg 2x a day with a meal. Continue Glipizide 5 mg 2x a day with a meal. - Strongly advised him to start checking sugars at different times of the day - check 1 x a day, rotating checks - given more sugar logs and advised how to fill it and to bring it at next appt  - reiterated yearly eye exams - had the flu  vaccine this season - Return to clinic in 1.5 mo with sugar log - will check HbA1c and Lipids then

## 2013-06-18 ENCOUNTER — Other Ambulatory Visit: Payer: Self-pay | Admitting: *Deleted

## 2013-06-18 DIAGNOSIS — E119 Type 2 diabetes mellitus without complications: Secondary | ICD-10-CM

## 2013-06-18 MED ORDER — METFORMIN HCL 500 MG PO TABS
ORAL_TABLET | ORAL | Status: DC
Start: 2013-06-18 — End: 2013-06-21

## 2013-06-21 ENCOUNTER — Other Ambulatory Visit: Payer: Self-pay | Admitting: *Deleted

## 2013-06-21 DIAGNOSIS — E119 Type 2 diabetes mellitus without complications: Secondary | ICD-10-CM

## 2013-06-21 MED ORDER — METFORMIN HCL 500 MG PO TABS
ORAL_TABLET | ORAL | Status: DC
Start: 2013-06-21 — End: 2014-01-07

## 2013-06-21 NOTE — Telephone Encounter (Signed)
Rx did not go through. Resending to [employer].

## 2013-07-12 ENCOUNTER — Ambulatory Visit: Payer: 59 | Admitting: Internal Medicine

## 2013-07-21 ENCOUNTER — Other Ambulatory Visit: Payer: Self-pay | Admitting: Family Medicine

## 2013-12-24 ENCOUNTER — Telehealth: Payer: Self-pay | Admitting: *Deleted

## 2013-12-24 DIAGNOSIS — E119 Type 2 diabetes mellitus without complications: Secondary | ICD-10-CM

## 2013-12-24 NOTE — Telephone Encounter (Signed)
Left message on machine for patient to schedule lab/ office visit A1c, bmet, micro albumin, lipid ordered Diabetic bundle

## 2014-01-07 ENCOUNTER — Ambulatory Visit: Payer: 59 | Admitting: Internal Medicine

## 2014-01-07 ENCOUNTER — Encounter: Payer: Self-pay | Admitting: Internal Medicine

## 2014-01-07 ENCOUNTER — Ambulatory Visit (INDEPENDENT_AMBULATORY_CARE_PROVIDER_SITE_OTHER): Payer: 59 | Admitting: Internal Medicine

## 2014-01-07 VITALS — BP 138/82 | HR 94 | Temp 98.6°F | Resp 16 | Wt 369.0 lb

## 2014-01-07 DIAGNOSIS — E119 Type 2 diabetes mellitus without complications: Secondary | ICD-10-CM

## 2014-01-07 LAB — HEMOGLOBIN A1C: Hgb A1c MFr Bld: 9.7 % — ABNORMAL HIGH (ref 4.6–6.5)

## 2014-01-07 MED ORDER — CANAGLIFLOZIN 100 MG PO TABS: 100.0000 mg | ORAL_TABLET | Freq: Every day | ORAL | Status: DC

## 2014-01-07 MED ORDER — GLIPIZIDE 5 MG PO TABS: 5.0000 mg | ORAL_TABLET | Freq: Two times a day (BID) | ORAL | Status: DC

## 2014-01-07 MED ORDER — METFORMIN HCL ER (OSM) 1000 MG PO TB24: 1000.0000 mg | ORAL_TABLET | Freq: Every day | ORAL | Status: DC

## 2014-01-07 NOTE — Patient Instructions (Signed)
Please stop Regular Metformin and start Metformin ER 1000 mg 2x a day. You can also take this 2000 mg daily with dinner. Continue Glipizide 5 mg 2x a day. Start Invokana 100 mg in am.  Please return in 1 month with your sugar log.

## 2014-01-07 NOTE — Progress Notes (Signed)
Patient ID: Brent Wallace, male   DOB: 1960-08-07, 53 y.o.   MRN: [id number]  HPI: Brent Wallace is a 53 y.o.-year-old male, returning for f/u for DM2, dx 2009, non-insulin-dependent, uncontrolled, with complications (ED). Last visit 36mo ago!  Last hemoglobin A1c was: Lab Results  Component Value Date   HGBA1C 8.9* 04/06/2013   HGBA1C 8.7* 07/09/2012   HGBA1C 7.5* 02/20/2012   Pt is on a regimen of: - Metformin 1000 mg bid >> has occasional diarrhea - Glipizide 5 mg bid  Pt checks his sugars 0-1x a day they are: - am: 123-201, most 140s >> 106-160s >> 188, 272 (in 10/2013, when he forgot the meds) - 2h after b'fast: n/c - before lunch: n/c - 2h after lunch: 225 - before dinner: 175 - 2h after dinner: 136 >> n/c - bedtime: 105-165 No lows. Lowest sugar was 105; ? he has hypoglycemia awareness. Highest sugar was 272.  - no CKD, last BUN/creatinine:  Lab Results  Component Value Date   BUN 11 04/06/2013   CREATININE 0.8 04/06/2013  On Lisinopril. ACR was 31.4 on 04/06/2013. - last set of lipids: Lab Results  Component Value Date   CHOL 133 02/20/2011   HDL 47.90 02/20/2011   LDLCALC 74 02/20/2011   TRIG 57.0 02/20/2011   CHOLHDL 3 02/20/2011  Not on Statin. - last eye exam was in 2011 (Dr Gevena Cotton [facility]). No DR. Has optic neuritis in right eye.  - no numbness and tingling in his feet.  I reviewed his chart and he also has a history of OSA - wears CPAP every night; duodenal ulcer from NSAIDs.  I reviewed pt's medications, allergies, PMH, social hx, family hx and no changes required, except as mentioned above.  ROS: Constitutional: + weight loss, no fatigue Eyes: no blurry vision, no xerophthalmia ENT: no sore throat, no nodules palpated in throat, no dysphagia/odynophagia, no hoarseness Cardiovascular: no CP/SOB/palpitations/leg swelling Respiratory: no cough/SOB Gastrointestinal: no N/V/D/C Musculoskeletal: no muscle/+ joint aches Skin: no  rashes Neurological: no tremors/numbness/tingling/dizziness Low libido  PE: BP 138/82  Pulse 94  Temp(Src) 98.6 F (37 C) (Oral)  Resp 16  Wt 369 lb (167.377 kg)  SpO2 95% Wt Readings from Last 3 Encounters:  01/07/14 369 lb (167.377 kg)  05/27/13 367 lb (166.47 kg)  05/17/13 375 lb (170.099 kg)   Constitutional: obese, in NAD Eyes: PERRLA, EOMI, no exophthalmos ENT: moist mucous membranes, no thyromegaly, no cervical lymphadenopathy; mallampati 4 Cardiovascular: RRR, No MRG Respiratory: CTA B Gastrointestinal: abdomen soft, NT, ND, BS+ Musculoskeletal: no deformities, strength intact in all 4 Skin: moist, warm, no rashes  ASSESSMENT: 1. DM2, non-insulin-dependent, uncontrolled, with complications - Erectile dysfunction  PLAN:  1. Patient with long-standing diabetes, on oral antidiabetic regimen, with unclear control recently since he did not check his sugars and last HbA1c was from last fall. - We discussed about options for treatment, and I suggested to:  Patient Instructions  Please stop Regular Metformin and start Metformin ER 1000 mg 2x a day. You can also take this 2000 mg daily with dinner. Continue Glipizide 5 mg 2x a day. Start Invokana 100 mg in am. Please return in 1 month with your sugar log.  - we discussed about SEs of Invokana, which are: dizziness (advised to be careful when stands from sitting position), decreased BP - usually not < normal (BP today is not low), and fungal UTIs (advised to let me know if develops one).  - given discount  card for Invokana - Strongly advised him to start checking sugars at different times of the day - check 1 x a day, rotating checks - given more sugar logs and advised how to fill it and to bring it at next appt  - advised to get a new eye exam - will check HbA1c now - refilled all his DM meds - Return to clinic in 1 mo with sugar log   Office Visit on 01/07/2014  Component Date Value Ref Range Status  . Hemoglobin A1C  01/07/2014 9.7* 4.6 - 6.5 % Final   Glycemic Control Guidelines for People with Diabetes:Non Diabetic:  <6%Goal of Therapy: <7%Additional Action Suggested:  >8%    HbA1c increased >> see plan above.

## 2014-02-22 ENCOUNTER — Telehealth: Payer: Self-pay | Admitting: Internal Medicine

## 2014-02-22 ENCOUNTER — Ambulatory Visit: Payer: 59 | Admitting: Internal Medicine

## 2014-02-22 NOTE — Telephone Encounter (Signed)
Please read note below and advise.  

## 2014-02-22 NOTE — Telephone Encounter (Signed)
Patient no showed today's appt. Please advise on how to follow up. °A. No follow up necessary. °B. Follow up urgent. Contact patient immediately. °C. Follow up necessary. Contact patient and schedule visit in ___ days. °D. Follow up advised. Contact patient and schedule visit in ____weeks. ° °

## 2014-02-22 NOTE — Telephone Encounter (Signed)
Within 1 mo 

## 2014-02-22 NOTE — Telephone Encounter (Signed)
Please read note below

## 2014-02-24 NOTE — Telephone Encounter (Signed)
LM for callback to reschedule

## 2014-03-10 NOTE — Telephone Encounter (Signed)
LM for callback to reschedule

## 2014-03-11 ENCOUNTER — Ambulatory Visit (INDEPENDENT_AMBULATORY_CARE_PROVIDER_SITE_OTHER): Payer: 59 | Admitting: Family Medicine

## 2014-03-11 ENCOUNTER — Encounter: Payer: Self-pay | Admitting: Family Medicine

## 2014-03-11 VITALS — BP 136/84 | HR 86 | Temp 98.1°F | Wt 353.0 lb

## 2014-03-11 DIAGNOSIS — M545 Low back pain, unspecified: Secondary | ICD-10-CM

## 2014-03-11 DIAGNOSIS — M5489 Other dorsalgia: Secondary | ICD-10-CM
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
## 2014-03-11 NOTE — Progress Notes (Signed)
Pre visit review using our clinic review tool, if applicable. No additional management support is needed unless otherwise documented below in the visit note. 

## 2014-03-11 NOTE — Patient Instructions (Signed)
Lumbosacral Strain Lumbosacral strain is a strain of any of the parts that make up your lumbosacral vertebrae. Your lumbosacral vertebrae are the bones that make up the lower third of your backbone. Your lumbosacral vertebrae are held together by muscles and tough, fibrous tissue (ligaments).  CAUSES  A sudden blow to your back can cause lumbosacral strain. Also, anything that causes an excessive stretch of the muscles in the low back can cause this strain. This is typically seen when people exert themselves strenuously, fall, lift heavy objects, bend, or crouch repeatedly. RISK FACTORS  Physically demanding work.  Participation in pushing or pulling sports or sports that require a sudden twist of the back (tennis, golf, baseball).  Weight lifting.  Excessive lower back curvature.  Forward-tilted pelvis.  Weak back or abdominal muscles or both.  Tight hamstrings. SIGNS AND SYMPTOMS  Lumbosacral strain may cause pain in the area of your injury or pain that moves (radiates) down your leg.  DIAGNOSIS Your health care provider can often diagnose lumbosacral strain through a physical exam. In some cases, you may need tests such as X-ray exams.  TREATMENT  Treatment for your lower back injury depends on many factors that your clinician will have to evaluate. However, most treatment will include the use of anti-inflammatory medicines. HOME CARE INSTRUCTIONS   Avoid hard physical activities (tennis, racquetball, waterskiing) if you are not in proper physical condition for it. This may aggravate or create problems.  If you have a back problem, avoid sports requiring sudden body movements. Swimming and walking are generally safer activities.  Maintain good posture.  Maintain a healthy weight.  For acute conditions, you may put ice on the injured area.  Put ice in a plastic bag.  Place a towel between your skin and the bag.  Leave the ice on for 20 minutes, 2-3 times a day.  When the  low back starts healing, stretching and strengthening exercises may be recommended. SEEK MEDICAL CARE IF:  Your back pain is getting worse.  You experience severe back pain not relieved with medicines. SEEK IMMEDIATE MEDICAL CARE IF:   You have numbness, tingling, weakness, or problems with the use of your arms or legs.  There is a change in bowel or bladder control.  You have increasing pain in any area of the body, including your belly (abdomen).  You notice shortness of breath, dizziness, or feel faint.  You feel sick to your stomach (nauseous), are throwing up (vomiting), or become sweaty.  You notice discoloration of your toes or legs, or your feet get very cold. MAKE SURE YOU:   Understand these instructions.  Will watch your condition.  Will get help right away if you are not doing well or get worse. Document Released: 03/06/2005 Document Revised: 06/01/2013 Document Reviewed: 01/13/2013 [facility] Patient Information 2015 [place], [place]. This information is not intended to replace advice given to you by your health care provider. Make sure you discuss any questions you have with your health care provider.  Try the Tramadol for pain and let us know if pain no better by next week.

## 2014-03-17 ENCOUNTER — Ambulatory Visit: Payer: 59 | Admitting: Internal Medicine

## 2014-03-24 DIAGNOSIS — Z0289 Encounter for other administrative examinations: Secondary | ICD-10-CM

## 2014-04-12 ENCOUNTER — Other Ambulatory Visit: Payer: Self-pay | Admitting: Family Medicine

## 2014-04-20 ENCOUNTER — Ambulatory Visit (INDEPENDENT_AMBULATORY_CARE_PROVIDER_SITE_OTHER): Payer: 59 | Admitting: Internal Medicine

## 2014-04-20 ENCOUNTER — Other Ambulatory Visit (INDEPENDENT_AMBULATORY_CARE_PROVIDER_SITE_OTHER): Payer: 59 | Admitting: *Deleted

## 2014-04-20 ENCOUNTER — Encounter: Payer: Self-pay | Admitting: Internal Medicine

## 2014-04-20 VITALS — BP 118/74 | HR 95 | Temp 98.5°F | Resp 12 | Wt 358.0 lb

## 2014-04-20 DIAGNOSIS — E1169 Type 2 diabetes mellitus with other specified complication: Secondary | ICD-10-CM

## 2014-04-20 DIAGNOSIS — Z23 Encounter for immunization: Secondary | ICD-10-CM

## 2014-04-20 LAB — BASIC METABOLIC PANEL
BUN: 18 mg/dL (ref 6–23)
CO2: 24 mEq/L (ref 19–32)
CREATININE: 1.1 mg/dL (ref 0.4–1.5)
Calcium: 9.5 mg/dL (ref 8.4–10.5)
Chloride: 101 mEq/L (ref 96–112)
GFR: 93.36 mL/min (ref >60.00)
Glucose, Bld: 174 mg/dL — ABNORMAL HIGH (ref 70–99)
POTASSIUM: 3.9 meq/L (ref 3.5–5.1)
Sodium: 138 mEq/L (ref 135–145)

## 2014-04-20 LAB — LIPID PANEL
CHOLESTEROL: 164 mg/dL (ref 0–200)
HDL: 47.6 mg/dL (ref >39.00)
LDL Cholesterol: 101 mg/dL — ABNORMAL HIGH (ref 0–99)
NonHDL: 116.4
Total CHOL/HDL Ratio: 3
Triglycerides: 76 mg/dL (ref 0.0–149.0)
VLDL: 15.2 mg/dL (ref 0.0–40.0)

## 2014-04-20 LAB — HEMOGLOBIN A1C: Hgb A1c MFr Bld: 8.3 % — ABNORMAL HIGH (ref 4.6–6.5)

## 2014-04-20 NOTE — Patient Instructions (Signed)
Please increase Metformin ER to 1000 mg 2x a day. You can also take this 2000 mg daily with dinner. Continue Glipizide 5 mg 2x a day. Continue Invokana 100 mg in am. Please return in 3 months with your sugar log.  Please stop at the lab.

## 2014-04-20 NOTE — Progress Notes (Signed)
Patient ID: Brent Wallace, male   DOB: [birth year], 53 y.o.   MRN: [id number]  HPI: Brent Wallace is a 53 y.o.-year-old male, returning for f/u for DM2, dx 2009, non-insulin-dependent, uncontrolled, with complications (ED). Last visit 5 mo ago (did not return in 1 mo)!  Last hemoglobin A1c was: Lab Results  Component Value Date   HGBA1C 9.7* 01/07/2014   HGBA1C 8.9* 04/06/2013   HGBA1C 8.7* 07/09/2012   Pt is on a regimen of: - Metformin 1000 mg bid >> had occasional diarrhea >> switch to Metformin XR 500 mg bid - Glipizide 5 mg bid - added Invokana 100 mg daily in am in 12/2013 >> no SEs, but he does urinate frequently  Pt checks his sugars 0-1x a day they are: - am: 123-201, most 140s >> 106-160s >> 188, 272 (in 10/2013, when he forgot the meds) >> 113-126, 163 - 2h after b'fast: n/c >> 175 - before lunch: n/c >> 135-150 - 2h after lunch: 225 >> 132-175 - before dinner: 175 >> 85-137 - 2h after dinner: 136 >> n/c >> 89-184 - bedtime: 105-165 No lows. Lowest sugar was 84; ? he has hypoglycemia awareness. Highest sugar was 272 >> 201 when skipped the meds.  - no CKD, last BUN/creatinine:  Lab Results  Component Value Date   BUN 11 04/06/2013   CREATININE 0.8 04/06/2013  On Lisinopril. ACR was 31.4 on 04/06/2013. - last set of lipids: Lab Results  Component Value Date   CHOL 133 02/20/2011   HDL 47.90 02/20/2011   LDLCALC 74 02/20/2011   TRIG 57.0 02/20/2011   CHOLHDL 3 02/20/2011  Not on Statin. - last eye exam was in 2011 (Dr Gevena Cotton [facility]). No DR. Has optic neuritis in right eye.  - no numbness and tingling in his feet.  I reviewed his chart and he also has a history of OSA - wears CPAP every night; duodenal ulcer from NSAIDs.  I reviewed pt's medications, allergies, PMH, social hx, family hx and no changes required, except as mentioned above.  ROS: Constitutional: + weight loss, no fatigue, + increase urination Eyes: no blurry vision, no  xerophthalmia ENT: no sore throat, no nodules palpated in throat, no dysphagia/odynophagia, no hoarseness Cardiovascular: no CP/SOB/palpitations/leg swelling Respiratory: no cough/SOB Gastrointestinal: no N/V/D/C Musculoskeletal: no muscle/+ joint aches Skin: no rashes Neurological: no tremors/numbness/tingling/dizziness Low libido  PE: BP 118/74 mmHg  Pulse 95  Temp(Src) 98.5 F (36.9 C) (Oral)  Resp 12  Wt 358 lb (162.388 kg)  SpO2 96% Wt Readings from Last 3 Encounters:  04/20/14 358 lb (162.388 kg)  03/11/14 353 lb (160.12 kg)  01/07/14 369 lb (167.377 kg)   Constitutional: obese, in NAD Eyes: PERRLA, EOMI, no exophthalmos ENT: moist mucous membranes, no thyromegaly, no cervical lymphadenopathy; mallampati 4 Cardiovascular: RRR, No MRG Respiratory: CTA B Gastrointestinal: abdomen soft, NT, ND, BS+ Musculoskeletal: no deformities, strength intact in all 4 Skin: moist, warm, no rashes  ASSESSMENT: 1. DM2, non-insulin-dependent, uncontrolled, with complications - Erectile dysfunction  PLAN:  1. Patient with long-standing diabetes, on oral antidiabetic regimen, with improved control recently after adding Invokana. He may skip med doses >> sugars higher then.  - We discussed about options for treatment, and I suggested to continue current tx except increase Metformin to 1000 mg bid:  Patient Instructions  Please increase Metformin ER to 1000 mg 2x a day. You can also take this 2000 mg daily with dinner. Continue Glipizide 5 mg 2x a day.  Continue Invokana 100 mg in am. Please return in 3 months with your sugar log.  Please stop at the lab. - given more sugar logs and advised how to fill it and to bring it at next appt  - advised to get a new eye exam - will check HbA1c, Lipids, BMP  - will give the flu vaccine today - Return to clinic in 3 mo with sugar log   Office Visit on 04/20/2014  Component Date Value Ref Range Status  . Hgb A1c MFr Bld 04/20/2014 8.3* 4.6 -  6.5 % Final   Glycemic Control Guidelines for People with Diabetes:Non Diabetic:  <6%Goal of Therapy: <7%Additional Action Suggested:  >8%   . Sodium 04/20/2014 138  135 - 145 mEq/L Final  . Potassium 04/20/2014 3.9  3.5 - 5.1 mEq/L Final  . Chloride 04/20/2014 101  96 - 112 mEq/L Final  . CO2 04/20/2014 24  19 - 32 mEq/L Final  . Glucose, Bld 04/20/2014 174* 70 - 99 mg/dL Final  . BUN 04/20/2014 18  6 - 23 mg/dL Final  . Creatinine, Ser 04/20/2014 1.1  0.4 - 1.5 mg/dL Final  . Calcium 04/20/2014 9.5  8.4 - 10.5 mg/dL Final  . GFR 04/20/2014 93.36  >60.00 mL/min Final  . Cholesterol 04/20/2014 164  0 - 200 mg/dL Final   ATP III Classification       Desirable:  < 200 mg/dL               Borderline High:  200 - 239 mg/dL          High:  > = 240 mg/dL  . Triglycerides 04/20/2014 76.0  0.0 - 149.0 mg/dL Final   Normal:  <150 mg/dLBorderline High:  150 - 199 mg/dL  . HDL 04/20/2014 47.60  >39.00 mg/dL Final  . VLDL 04/20/2014 15.2  0.0 - 40.0 mg/dL Final  . LDL Cholesterol 04/20/2014 101* 0 - 99 mg/dL Final  . Total CHOL/HDL Ratio 04/20/2014 3   Final                  Men          Women1/2 Average Risk     3.4          3.3Average Risk          5.0          4.42X Average Risk          9.6          7.13X Average Risk          15.0          11.0                      . NonHDL 04/20/2014 116.40   Final   NOTE:  Non-HDL goal should be 30 mg/dL higher than patient's LDL goal (i.e. LDL goal of < 70 mg/dL, would have non-HDL goal of < 100 mg/dL)   Hemoglobin A1c improved. Potassium not elevated. Good GFR. LDL 101.

## 2014-05-14 ENCOUNTER — Other Ambulatory Visit: Payer: Self-pay | Admitting: Family Medicine

## 2014-05-23 ENCOUNTER — Ambulatory Visit (INDEPENDENT_AMBULATORY_CARE_PROVIDER_SITE_OTHER): Payer: 59 | Admitting: Pulmonary Disease

## 2014-05-23 ENCOUNTER — Encounter: Payer: Self-pay | Admitting: Pulmonary Disease

## 2014-05-23 VITALS — BP 136/78 | HR 78 | Temp 98.1°F | Ht 70.0 in | Wt 370.0 lb

## 2014-05-23 DIAGNOSIS — G4733 Obstructive sleep apnea (adult) (pediatric): Secondary | ICD-10-CM

## 2014-05-23 NOTE — Progress Notes (Signed)
   Subjective:    Patient ID: Brent Wallace, male    DOB: [birth year], 53 y.o.   MRN: [id number]  HPI The patient comes in today for follow-up of his obstructive sleep apnea. He is wearing C Pap compliantly, and feels that he is resting well with his device. He has no issues with his mask fit or pressure, and feels fairly rested during the day. He has lost 5 pounds since the last visit.   Review of Systems  Constitutional: Negative for fever and unexpected weight change.  HENT: Negative for congestion, dental problem, ear pain, nosebleeds, postnasal drip, rhinorrhea, sinus pressure, sneezing, sore throat and trouble swallowing.   Eyes: Negative for redness and itching.  Respiratory: Negative for cough, chest tightness, shortness of breath and wheezing.   Cardiovascular: Negative for palpitations and leg swelling.  Gastrointestinal: Negative for nausea and vomiting.  Genitourinary: Negative for dysuria.  Musculoskeletal: Negative for joint swelling.  Skin: Negative for rash.  Neurological: Negative for headaches.  Hematological: Does not bruise/bleed easily.  Psychiatric/Behavioral: Negative for dysphoric mood. The patient is not nervous/anxious.        Objective:   Physical Exam Obese male in no acute distress Nose without purulence or discharge noted Neck without lymphadenopathy or thyromegaly No skin breakdown or pressure necrosis from the C Pap mask Lower extremities with mild edema, no cyanosis Alert and oriented, does not appear to be sleepy, moves all 4 extremities.       Assessment & Plan:

## 2014-05-23 NOTE — Patient Instructions (Signed)
Continue on C Pap, and keep up with your mask changes and supplies Work on weight loss Follow-up with me again in one year.

## 2014-05-23 NOTE — Assessment & Plan Note (Signed)
The patient feels that he is doing very well on C Pap, and is satisfied with his sleep and daytime alertness. I have encouraged him to continue working on weight loss, and to keep up with his mask changes and supplies. I will see him back in one year if he is doing well.

## 2014-06-27 LAB — HM DIABETES EYE EXAM

## 2014-06-28 ENCOUNTER — Other Ambulatory Visit: Payer: Self-pay | Admitting: Internal Medicine

## 2014-07-20 ENCOUNTER — Ambulatory Visit (INDEPENDENT_AMBULATORY_CARE_PROVIDER_SITE_OTHER): Payer: 59 | Admitting: Internal Medicine

## 2014-07-20 ENCOUNTER — Encounter: Payer: Self-pay | Admitting: Internal Medicine

## 2014-07-20 VITALS — BP 126/78 | HR 93 | Temp 98.4°F | Resp 14 | Wt 364.0 lb

## 2014-07-20 DIAGNOSIS — E1169 Type 2 diabetes mellitus with other specified complication: Secondary | ICD-10-CM

## 2014-07-20 LAB — HEMOGLOBIN A1C: HEMOGLOBIN A1C: 10.3 % — AB (ref 4.6–6.5)

## 2014-07-20 MED ORDER — LISINOPRIL 10 MG PO TABS: 10.0000 mg | ORAL_TABLET | Freq: Every day | ORAL | Status: DC

## 2014-07-20 MED ORDER — CANAGLIFLOZIN 100 MG PO TABS
100.0000 mg | ORAL_TABLET | Freq: Every day | ORAL | Status: DC
Start: 2014-07-20 — End: 2014-12-03

## 2014-07-20 NOTE — Patient Instructions (Signed)
Please continue  Metformin XR 500 mg 2x a day with meals. Continue Glipizide 5 mg 2x a day, but move them before breakfast and dinner Continue Invokana 100 mg daily in am.  Stop Lisinopril-HCTZ and start only Lisinopril - I sent this to her pharmacy.  Please return in 1.5 month with your sugar log.   Please stop at the lab.

## 2014-07-20 NOTE — Progress Notes (Signed)
Patient ID: Brent Wallace, male   DOB: 1960/09/01, 54 y.o.   MRN: [id number]  HPI: Brent Wallace is a 54 y.o.-year-old male, returning for f/u for DM2, dx 2009, non-insulin-dependent, uncontrolled, with complications (ED). Last visit 3 mo ago.  Last hemoglobin A1c was: Lab Results  Component Value Date   HGBA1C 8.3* 04/20/2014   HGBA1C 9.7* 01/07/2014   HGBA1C 8.9* 04/06/2013   Pt is on a regimen of: - Metformin 1000 mg bid >> had occasional diarrhea >> switch to Metformin XR 500 mg bid - Glipizide 5 mg bid - last dose b/f bedtime!!! - Invokana 100 mg daily in am in 12/2013 >> no SEs, but he does urinate frequently >> has been omiting doses   Pt checks his sugars 0-1x a day they are: - am: 188, 272 (in 10/2013, when he forgot the meds) >> 113-126, 163 >> 116-140 - 2h after b'fast: n/c >> 175 >> n/c - before lunch: n/c >> 135-150 >> 191-230 (if forgets or omits) the Invokana - 2h after lunch: 225 >> 132-175 >> 169 - before dinner: 175 >> 85-137 >> 267 - 2h after dinner: 136 >> n/c >> 89-184 >> 113-243 - bedtime: 105-165 >> n/c No lows. Lowest sugar was 84; ? he has hypoglycemia awareness. Highest sugar was 272 >> 201 when skipped the meds.  - no CKD, last BUN/creatinine:  Lab Results  Component Value Date   BUN 18 04/20/2014   CREATININE 1.1 04/20/2014  On Lisinopril. ACR was 31.4 on 04/06/2013. - last set of lipids: Lab Results  Component Value Date   CHOL 164 04/20/2014   HDL 47.60 04/20/2014   LDLCALC 101* 04/20/2014   TRIG 76.0 04/20/2014   CHOLHDL 3 04/20/2014  Not on Statin. - last eye exam was on 06/27/2014 (Dr Gevena Cotton [facility]). No DR. Has optic neuritis in right eye.  - no numbness and tingling in his feet.  I reviewed his chart and he also has a history of OSA - wears CPAP every night; duodenal ulcer from NSAIDs.  I reviewed pt's medications, allergies, PMH, social hx, family hx, and changes were documented in the history of present illness.  Otherwise, unchanged from my initial visit note.  ROS: Constitutional: no weight loss, no fatigue, + increase urination >> skips Invokana b/c of this! Eyes: no blurry vision, no xerophthalmia ENT: no sore throat, no nodules palpated in throat, no dysphagia/odynophagia, no hoarseness Cardiovascular: no CP/SOB/palpitations/leg swelling Respiratory: no cough/SOB Gastrointestinal: no N/V/D/C Musculoskeletal: + muscle/+ joint aches Skin: no rashes Neurological: no tremors/numbness/tingling/dizziness Low libido  PE: BP 126/78 mmHg  Pulse 93  Temp(Src) 98.4 F (36.9 C) (Oral)  Resp 14  Wt 364 lb (165.109 kg)  SpO2 95% Wt Readings from Last 3 Encounters:  07/20/14 364 lb (165.109 kg)  05/23/14 370 lb (167.831 kg)  04/20/14 358 lb (162.388 kg)   Constitutional: obese, in NAD Eyes: PERRLA, EOMI, no exophthalmos ENT: moist mucous membranes, no thyromegaly, no cervical lymphadenopathy; mallampati 4 Cardiovascular: RRR, No MRG Respiratory: CTA B Gastrointestinal: abdomen soft, NT, ND, BS+ Musculoskeletal: no deformities, strength intact in all 4 Skin: moist, warm, no rashes  ASSESSMENT: 1. DM2, non-insulin-dependent, uncontrolled, with complications - Erectile dysfunction  PLAN:  1. Patient with long-standing diabetes, on oral antidiabetic regimen, with improved control after adding Invokana, but he has increased urination >> skips med doses >> sugars higher at this visit. I will stop his diuretic. If still increased urination >> we will need to stop  this at next visit. Also, will move glipizide to before dinner as now he is taking it at bedtime.  - I suggested:  Patient Instructions  Please continue  Metformin XR 500 mg 2x a day with meals. Continue Glipizide 5 mg 2x a day, but move them before breakfast and dinner Continue Invokana 100 mg daily in am.  Stop Lisinopril-HCTZ and start only Lisinopril - I sent this to her pharmacy.  Please return in 1.5 month with your sugar log.    Please stop at the lab. - given more sugar logs and advised how to fill it and to bring it at next appt  - Up to date with eye exams - will check HbA1c today - gave him the flu vaccine this season - Return to clinic in 1.5 mo with sugar log   Office Visit on 07/20/2014  Component Date Value Ref Range Status  . HM Diabetic Eye Exam 06/27/2014 No Retinopathy  No Retinopathy Final  . Hgb A1c MFr Bld 07/20/2014 10.3* 4.6 - 6.5 % Final   Glycemic Control Guidelines for People with Diabetes:Non Diabetic:  <6%Goal of Therapy: <7%Additional Action Suggested:  >8%    HbA1c higher, as expected. See plan above.

## 2014-07-21 ENCOUNTER — Ambulatory Visit: Payer: 59 | Admitting: Internal Medicine

## 2014-08-15 ENCOUNTER — Other Ambulatory Visit: Payer: Self-pay | Admitting: Internal Medicine

## 2014-08-29 ENCOUNTER — Encounter: Payer: 59 | Admitting: Family Medicine

## 2014-09-01 ENCOUNTER — Ambulatory Visit: Payer: 59 | Admitting: Internal Medicine

## 2014-09-26 ENCOUNTER — Other Ambulatory Visit: Payer: Self-pay | Admitting: Internal Medicine

## 2014-09-28 ENCOUNTER — Ambulatory Visit (INDEPENDENT_AMBULATORY_CARE_PROVIDER_SITE_OTHER): Payer: 59 | Admitting: Family Medicine

## 2014-09-28 ENCOUNTER — Encounter: Payer: Self-pay | Admitting: Family Medicine

## 2014-09-28 VITALS — BP 170/110 | Temp 98.4°F | Ht 70.5 in | Wt 361.9 lb

## 2014-09-28 DIAGNOSIS — Z23 Encounter for immunization: Secondary | ICD-10-CM

## 2014-09-28 DIAGNOSIS — Z0000 Encounter for general adult medical examination without abnormal findings: Secondary | ICD-10-CM

## 2014-09-28 DIAGNOSIS — N401 Enlarged prostate with lower urinary tract symptoms: Secondary | ICD-10-CM | POA: Diagnosis not present

## 2014-09-28 DIAGNOSIS — G4733 Obstructive sleep apnea (adult) (pediatric): Secondary | ICD-10-CM

## 2014-09-28 DIAGNOSIS — E1169 Type 2 diabetes mellitus with other specified complication: Secondary | ICD-10-CM

## 2014-09-28 DIAGNOSIS — R351 Nocturia: Secondary | ICD-10-CM

## 2014-09-28 DIAGNOSIS — I10 Essential (primary) hypertension: Secondary | ICD-10-CM | POA: Diagnosis not present

## 2014-09-28 DIAGNOSIS — N529 Male erectile dysfunction, unspecified: Secondary | ICD-10-CM

## 2014-09-28 DIAGNOSIS — E669 Obesity, unspecified: Secondary | ICD-10-CM

## 2014-09-28 DIAGNOSIS — G35 Multiple sclerosis: Secondary | ICD-10-CM

## 2014-09-28 LAB — CBC WITH DIFFERENTIAL/PLATELET
Basophils Absolute: 0 K/uL (ref 0.0–0.1)
Basophils Relative: 0.5 % (ref 0.0–3.0)
Eosinophils Absolute: 0.1 K/uL (ref 0.0–0.7)
Eosinophils Relative: 1.9 % (ref 0.0–5.0)
HCT: 51 % (ref 39.0–52.0)
Hemoglobin: 17.5 g/dL — ABNORMAL HIGH (ref 13.0–17.0)
Lymphocytes Relative: 40.8 % (ref 12.0–46.0)
Lymphs Abs: 2.7 K/uL (ref 0.7–4.0)
MCHC: 34.2 g/dL (ref 30.0–36.0)
MCV: 87.3 fl (ref 78.0–100.0)
Monocytes Absolute: 0.5 K/uL (ref 0.1–1.0)
Monocytes Relative: 7.7 % (ref 3.0–12.0)
Neutro Abs: 3.3 K/uL (ref 1.4–7.7)
Neutrophils Relative %: 49.1 % (ref 43.0–77.0)
Platelets: 209 K/uL (ref 150.0–400.0)
RBC: 5.84 Mil/uL — ABNORMAL HIGH (ref 4.22–5.81)
RDW: 14.5 % (ref 11.5–15.5)
WBC: 6.7 K/uL (ref 4.0–10.5)

## 2014-09-28 LAB — BASIC METABOLIC PANEL
BUN: 15 mg/dL (ref 6–23)
CO2: 28 meq/L (ref 19–32)
Calcium: 9.8 mg/dL (ref 8.4–10.5)
Chloride: 101 mEq/L (ref 96–112)
Creatinine, Ser: 0.96 mg/dL (ref 0.40–1.50)
GFR: 104.5 mL/min (ref >60.00)
Glucose, Bld: 230 mg/dL — ABNORMAL HIGH (ref 70–99)
POTASSIUM: 4.2 meq/L (ref 3.5–5.1)
SODIUM: 136 meq/L (ref 135–145)

## 2014-09-28 LAB — POCT URINALYSIS DIPSTICK
BILIRUBIN UA: NEGATIVE
Blood, UA: NEGATIVE
KETONES UA: NEGATIVE
Leukocytes, UA: NEGATIVE
Nitrite, UA: NEGATIVE
PH UA: 5.5
Spec Grav, UA: 1.02
UROBILINOGEN UA: 0.2

## 2014-09-28 LAB — LIPID PANEL
CHOL/HDL RATIO: 3
Cholesterol: 149 mg/dL (ref 0–200)
HDL: 50.3 mg/dL (ref >39.00)
LDL Cholesterol: 83 mg/dL (ref 0–99)
NonHDL: 98.7
Triglycerides: 78 mg/dL (ref 0.0–149.0)
VLDL: 15.6 mg/dL (ref 0.0–40.0)

## 2014-09-28 LAB — PSA: PSA: 0.75 ng/mL (ref 0.10–4.00)

## 2014-09-28 LAB — MICROALBUMIN / CREATININE URINE RATIO
Creatinine,U: 131 mg/dL
Microalb Creat Ratio: 51.2 mg/g — ABNORMAL HIGH (ref 0.0–30.0)
Microalb, Ur: 67 mg/dL — ABNORMAL HIGH (ref 0.0–1.9)

## 2014-09-28 LAB — HEPATIC FUNCTION PANEL
ALK PHOS: 78 U/L (ref 39–117)
ALT: 20 U/L (ref 0–53)
AST: 15 U/L (ref 0–37)
Albumin: 4.3 g/dL (ref 3.5–5.2)
BILIRUBIN DIRECT: 0.2 mg/dL (ref 0.0–0.3)
BILIRUBIN TOTAL: 0.7 mg/dL (ref 0.2–1.2)
Total Protein: 7.2 g/dL (ref 6.0–8.3)

## 2014-09-28 LAB — TSH: TSH: 1.16 u[IU]/mL (ref 0.35–4.50)

## 2014-09-28 LAB — HEMOGLOBIN A1C: Hgb A1c MFr Bld: 9.6 % — ABNORMAL HIGH (ref 4.6–6.5)

## 2014-09-28 MED ORDER — SILDENAFIL CITRATE 100 MG PO TABS: ORAL_TABLET | ORAL | Status: DC

## 2014-09-28 MED ORDER — LISINOPRIL 10 MG PO TABS
10.0000 mg | ORAL_TABLET | Freq: Every day | ORAL | Status: DC
Start: 2014-09-28 — End: 2014-10-24

## 2014-09-28 NOTE — Patient Instructions (Addendum)
Restart your blood pressure medication........[name] 1 daily in the morning  Check your blood pressure daily in the morning  Return in May 10 with [name] for follow-up on your blood pressure  Rachel's extension is [phone]

## 2014-09-28 NOTE — Progress Notes (Signed)
Subjective:    Patient ID: Brent Wallace, male    DOB: [birth year], 54 y.o.   MRN: [id number]  HPI Brent Wallace is a 54 year old married male nonsmoker who comes in today for general physical examination because of a history of obesity, diabetes type 2, sleep apnea, spinal stenosis, hypertension, erectile dysfunction, and multiple sclerosis.  His major problem is his diabetes which is not under good control. We send him to Dr. Lorie Apley. A1c 2 months ago was 10.3. She's placed him on Invokana and glipizide 5 mg twice a day and Fortamet 1000 mg before breakfast. However over the weekend he is was out of his medication. Hasn't taken any of his medication for a week.  His blood pressures usually normal but is been out of his blood pressure medicine for a week. He's on lisinopril 10 mg daily.  His neurologist has him on Cymbalta 60 mg daily because of MS. He also takes Neurontin 300 mg at bedtime because of neuropathy. He uses Protonix when necessary and Viagra when necessary  Weight unchanged 362 pounds   Review of Systems  Constitutional: Negative.   HENT: Negative.   Eyes: Negative.   Respiratory: Negative.   Cardiovascular: Negative.   Gastrointestinal: Negative.   Endocrine: Negative.   Genitourinary: Negative.   Musculoskeletal: Negative.   Skin: Negative.   Allergic/Immunologic: Negative.   Neurological: Negative.   Hematological: Negative.   Psychiatric/Behavioral: Negative.        Objective:   Physical Exam  Constitutional: He is oriented to person, place, and time. He appears well-developed and well-nourished.  Morbid obesity  HENT:  Head: Normocephalic and atraumatic.  Right Ear: External ear normal.  Left Ear: External ear normal.  Nose: Nose normal.  Mouth/Throat: Oropharynx is clear and moist.  Eyes: Conjunctivae and EOM are normal. Pupils are equal, round, and reactive to light.  Neck: Normal range of motion. Neck supple. No JVD present. No tracheal deviation  present. No thyromegaly present.  Cardiovascular: Normal rate, regular rhythm, normal heart sounds and intact distal pulses.  Exam reveals no gallop and no friction rub.   No murmur heard. No carotid aortic bruits peripheral pulses 1+ and symmetrical  Pulmonary/Chest: Effort normal and breath sounds normal. No stridor. No respiratory distress. He has no wheezes. He has no rales. He exhibits no tenderness.  Abdominal: Soft. Bowel sounds are normal. He exhibits no distension and no mass. There is no tenderness. There is no rebound and no guarding.  Massive panniculus  Genitourinary: Rectum normal, prostate normal and penis normal. Guaiac negative stool. No penile tenderness.  Musculoskeletal: Normal range of motion. He exhibits no edema or tenderness.  Lymphadenopathy:    He has no cervical adenopathy.  Neurological: He is alert and oriented to person, place, and time. He has normal reflexes. No cranial nerve deficit. He exhibits normal muscle tone.  Skin: Skin is warm and dry. No rash noted. No erythema. No pallor.  Psychiatric: He has a normal mood and affect. His behavior is normal. Judgment and thought content normal.  Nursing note and vitals reviewed.         Assessment & Plan:  Hypertension not at goal........ patient is not taking his medicine for week...Marland KitchenMarland KitchenMarland Kitchen restart medication BP check daily follow-up May 10  Obesity............. once again discussed diet exercise and weight loss  Chronic back pain secondary to obesity and degenerative joint disease and spinal stenosis  MS........ in remission partial  Diabetes type 2 not at goal......... now followed in endocrinology by  Dr. Lorie Apley  Sleep apnea...Marland KitchenMarland KitchenMarland Kitchen followed by pulmonary  Erectile dysfunction refill Viagra

## 2014-09-29 ENCOUNTER — Telehealth: Payer: Self-pay | Admitting: Family Medicine

## 2014-10-11 ENCOUNTER — Encounter: Payer: Self-pay | Admitting: Internal Medicine

## 2014-10-11 ENCOUNTER — Ambulatory Visit (INDEPENDENT_AMBULATORY_CARE_PROVIDER_SITE_OTHER): Payer: 59 | Admitting: Internal Medicine

## 2014-10-11 VITALS — BP 132/90 | HR 87 | Temp 98.4°F | Resp 12 | Wt 359.0 lb

## 2014-10-11 DIAGNOSIS — E1169 Type 2 diabetes mellitus with other specified complication: Secondary | ICD-10-CM | POA: Diagnosis not present

## 2014-10-11 NOTE — Progress Notes (Signed)
Patient ID: Brent Wallace, male   DOB: 1960-12-29, 54 y.o.   MRN: [id number]  HPI: Brent Wallace is a 54 y.o.-year-old male, returning for f/u for DM2, dx 2009, non-insulin-dependent, uncontrolled, with complications (ED). Last visit 3 mo ago.  Last hemoglobin A1c was: Lab Results  Component Value Date   HGBA1C 9.6* 09/28/2014   HGBA1C 10.3* 07/20/2014   HGBA1C 8.3* 04/20/2014   Pt is on a regimen of: - Metformin 1000 mg bid >> had occasional diarrhea >> switched to Metformin XR 500 mg bid >> tolerates it well - Glipizide 5 mg bid - but takes it after started eating or 1h later, despite advice to move it before meals!  - Invokana 100 mg daily in am in 12/2013 - skips it 2x a week! (usually during the weekend) Had diarrhea with regular Metformin.  Pt checks his sugars 0-1x a day they are: - am: 188, 272 (in 10/2013, when he forgot the meds) >> 113-126, 163 >> 116-140 >> 117-190, 236, 257 (higher doses if misses metformin) - 2h after b'fast: n/c >> 175 >> n/c - before lunch: n/c >> 135-150 >> 191-230 >> see above (he wakes up late) - 2h after lunch: 225 >> 132-175 >> 169 >> 153 - before dinner: 175 >> 85-137 >> 267 >> 65-124, 161 - 2h after dinner: 136 >> n/c >> 89-184 >> 113-243 >> n/c - bedtime: 105-165 >> n/c No lows. Lowest sugar was 65; ? he has hypoglycemia awareness. Highest sugar was 272 >> 201 >> 257  - no CKD, last BUN/creatinine:  Lab Results  Component Value Date   BUN 15 09/28/2014   CREATININE 0.96 09/28/2014  On Lisinopril. ACR was 31.4 on 04/06/2013. - last set of lipids: Lab Results  Component Value Date   CHOL 149 09/28/2014   HDL 50.30 09/28/2014   LDLCALC 83 09/28/2014   TRIG 78.0 09/28/2014   CHOLHDL 3 09/28/2014  Not on Statin. - last eye exam was on 06/27/2014 (Dr Gevena Cotton [facility]). No DR. Has optic neuritis in right eye.  - no numbness and tingling in his feet.  He also has a history of OSA - wears CPAP every night; duodenal ulcer  from NSAIDs.  I reviewed pt's medications, allergies, PMH, social hx, family hx, and changes were documented in the history of present illness. Otherwise, unchanged from my initial visit note.  ROS: Constitutional: no weight loss, no fatigue, + increase urination  Eyes: no blurry vision, no xerophthalmia ENT: no sore throat, no nodules palpated in throat, no dysphagia/odynophagia, no hoarseness Cardiovascular: no CP/SOB/palpitations/leg swelling Respiratory: no cough/SOB Gastrointestinal: no N/V/D/C Musculoskeletal: no muscle/ joint aches Skin: no rashes Neurological: no tremors/numbness/tingling/dizziness  PE: BP 132/90 mmHg  Pulse 87  Temp(Src) 98.4 F (36.9 C) (Oral)  Resp 12  Wt 359 lb (162.841 kg)  SpO2 95% Wt Readings from Last 3 Encounters:  10/11/14 359 lb (162.841 kg)  09/28/14 361 lb 14.4 oz (164.157 kg)  07/20/14 364 lb (165.109 kg)   Constitutional: obese, in NAD Eyes: PERRLA, EOMI, no exophthalmos ENT: moist mucous membranes, no thyromegaly, no cervical lymphadenopathy; mallampati 4 Cardiovascular: RRR, No MRG Respiratory: CTA B Gastrointestinal: abdomen soft, NT, ND, BS+ Musculoskeletal: no deformities, strength intact in all 4 Skin: moist, warm, no rashes  ASSESSMENT: 1. DM2, non-insulin-dependent, uncontrolled, with complications - Erectile dysfunction  PLAN:  1. Patient with long-standing diabetes, on oral antidiabetic regimen, with improved control after adding Invokana, but still high sugars in am. He  skips the Invokana 2x a week b/c increased urination if he goes out >> advised to try to reduce this. We will also move Glipizide before meals. We will also increase Metformin. - I suggested to:  Patient Instructions  Please increase: - Metformin XR to 1000 mg 2x a day  Move  - Glipizide 5 mg 2x a day before meals  Continue: - Invokana 100 mg daily in am - do not skip it!  Please return in 3 months with your sugar log.   - Up to date with eye  exams - will check HbA1c at last visit - Return to clinic in 3 mo with sugar log

## 2014-10-11 NOTE — Patient Instructions (Addendum)
Please increase: - Metformin XR to 1000 mg 2x a day  Move  - Glipizide 5 mg 2x a day before meals  Continue: - Invokana 100 mg daily in am - do not skip it!  Please return in 3 months with your sugar log.

## 2014-10-18 ENCOUNTER — Ambulatory Visit (INDEPENDENT_AMBULATORY_CARE_PROVIDER_SITE_OTHER): Payer: 59 | Admitting: Family Medicine

## 2014-10-18 VITALS — BP 132/90 | Temp 98.4°F

## 2014-10-18 DIAGNOSIS — G4733 Obstructive sleep apnea (adult) (pediatric): Secondary | ICD-10-CM

## 2014-10-18 DIAGNOSIS — I10 Essential (primary) hypertension: Secondary | ICD-10-CM | POA: Diagnosis not present

## 2014-10-18 NOTE — Progress Notes (Signed)
Pre visit review using our clinic review tool, if applicable. No additional management support is needed unless otherwise documented below in the visit note. 

## 2014-10-18 NOTE — Patient Instructions (Signed)
Check your blood pressure daily in the morning for 2 weeks.......[name] fax me the data at [phone]

## 2014-10-18 NOTE — Progress Notes (Signed)
   Subjective:    Patient ID: Brent Wallace, male    DOB: [birth year], 54 y.o.   MRN: [id number]  HPI Brent Wallace is a 55 year old married male nonsmoker who comes in today for evaluation of hypertension. On 10 mg of lisinopril his BP this afternoon at 4:00 is 132/90. He's not been checking his blood pressure at home.  Because of his marked elevation of A1c we sent him to see endocrinology. They have him on Invokana and they're following his A1c.  He has sleep apnea weight is 359 pounds he uses CPAP nightly. Lab shows elevated hemoglobin. Advised to go donate blood at the [employer] 3 times a year. His wife's also concerned because he has proteinuria. Reviewed the cause of proteinuria which is his uncontrolled diabetes   Review of Systems Review of systems otherwise negative    Objective:   Physical Exam Well-developed overweight male no acute distress vital signs stable he is afebrile BP right arm sitting position 132/90       Assessment & Plan:  Hypertension almost at goal,,,,,,,,,, BP check every morning fax date in 2 weeks  Diabetes not at goal,,,,,,, continue to follow-up with endocrinology  Sleep apnea with elevated hemoglobin......... donate blood 3 times yearly

## 2014-10-24 ENCOUNTER — Other Ambulatory Visit: Payer: Self-pay | Admitting: *Deleted

## 2014-10-24 MED ORDER — GLIPIZIDE 5 MG PO TABS: ORAL_TABLET | ORAL | Status: DC

## 2014-10-24 MED ORDER — METFORMIN HCL ER (MOD) 1000 MG PO TB24: 1000.0000 mg | ORAL_TABLET | Freq: Two times a day (BID) | ORAL | Status: DC

## 2014-10-25 MED ORDER — LISINOPRIL 10 MG PO TABS: 10.0000 mg | ORAL_TABLET | Freq: Every day | ORAL | Status: DC

## 2014-10-26 ENCOUNTER — Other Ambulatory Visit: Payer: Self-pay | Admitting: *Deleted

## 2014-10-26 MED ORDER — LISINOPRIL 10 MG PO TABS: 10.0000 mg | ORAL_TABLET | Freq: Every day | ORAL | Status: DC

## 2014-10-26 MED ORDER — METFORMIN HCL ER (MOD) 1000 MG PO TB24: 1000.0000 mg | ORAL_TABLET | Freq: Two times a day (BID) | ORAL | Status: DC

## 2014-10-26 MED ORDER — GLIPIZIDE 5 MG PO TABS: ORAL_TABLET | ORAL | Status: DC

## 2014-10-26 NOTE — Telephone Encounter (Signed)
[facility] sent a fax, they never received the meds that were escript in on 5/16 and 5/17. Resending rx refills.

## 2014-10-28 ENCOUNTER — Other Ambulatory Visit: Payer: Self-pay | Admitting: *Deleted

## 2014-10-28 MED ORDER — LISINOPRIL 10 MG PO TABS
10.0000 mg | ORAL_TABLET | Freq: Every day | ORAL | Status: DC
Start: 2014-10-28 — End: 2014-11-03

## 2014-10-28 MED ORDER — GLIPIZIDE 5 MG PO TABS: ORAL_TABLET | ORAL | Status: DC

## 2014-10-28 MED ORDER — METFORMIN HCL ER (MOD) 1000 MG PO TB24: 1000.0000 mg | ORAL_TABLET | Freq: Two times a day (BID) | ORAL | Status: DC

## 2014-10-28 NOTE — Telephone Encounter (Signed)
[facility] has still not received the refill request. Sending again.

## 2014-11-02 ENCOUNTER — Telehealth: Payer: Self-pay | Admitting: Internal Medicine

## 2014-11-02 NOTE — Telephone Encounter (Signed)
Magda Paganini calling # [phone] REF #[id number]  The rx sent on 5/20 were not received by [facility] can we resend

## 2014-11-03 MED ORDER — GLIPIZIDE 5 MG PO TABS: ORAL_TABLET | ORAL | Status: DC

## 2014-11-03 MED ORDER — LISINOPRIL 10 MG PO TABS: 10.0000 mg | ORAL_TABLET | Freq: Every day | ORAL | Status: DC

## 2014-11-03 MED ORDER — METFORMIN HCL ER (MOD) 1000 MG PO TB24: 1000.0000 mg | ORAL_TABLET | Freq: Two times a day (BID) | ORAL | Status: DC

## 2014-11-03 NOTE — Telephone Encounter (Signed)
Resent rx for meds.

## 2014-11-25 ENCOUNTER — Telehealth: Payer: Self-pay | Admitting: *Deleted

## 2014-11-25 ENCOUNTER — Other Ambulatory Visit: Payer: Self-pay | Admitting: *Deleted

## 2014-11-25 MED ORDER — METFORMIN HCL ER 500 MG PO TB24: ORAL_TABLET | ORAL | Status: DC

## 2014-11-25 NOTE — Telephone Encounter (Signed)
This will have to be discussed with Dr. Darnell Level. since it is not of urgent concern and usually needs to be repeated before any changing any treatment

## 2014-11-25 NOTE — Telephone Encounter (Signed)
Pt's wife called requesting a refill of pt's metformin. She also stated that pt had labs done in April with Dr Sherren Mocha. His microalbumin/creatinine was high. He advised pt to discuss with Dr Cruzita Lederer. Pt has not, wife is concerned. Please review labs and advise in Dr Arman Filter absence. Thank you.

## 2014-11-25 NOTE — Telephone Encounter (Signed)
Called pt and pt's wife and advised them per Dr Ronnie Derby message. Advised to continue to drink plenty of water and watch his diet; discuss with Dr Cruzita Lederer at next visit. They voiced understanding.

## 2014-12-03 ENCOUNTER — Other Ambulatory Visit: Payer: Self-pay | Admitting: Internal Medicine

## 2015-01-12 ENCOUNTER — Ambulatory Visit (INDEPENDENT_AMBULATORY_CARE_PROVIDER_SITE_OTHER): Payer: 59 | Admitting: Internal Medicine

## 2015-01-12 ENCOUNTER — Other Ambulatory Visit (INDEPENDENT_AMBULATORY_CARE_PROVIDER_SITE_OTHER): Payer: 59 | Admitting: *Deleted

## 2015-01-12 ENCOUNTER — Encounter: Payer: Self-pay | Admitting: Internal Medicine

## 2015-01-12 VITALS — BP 140/80 | HR 84 | Temp 98.2°F | Resp 12 | Wt 363.0 lb

## 2015-01-12 DIAGNOSIS — E1169 Type 2 diabetes mellitus with other specified complication: Secondary | ICD-10-CM

## 2015-01-12 DIAGNOSIS — R809 Proteinuria, unspecified: Secondary | ICD-10-CM

## 2015-01-12 LAB — POCT GLYCOSYLATED HEMOGLOBIN (HGB A1C): Hemoglobin A1C: 7

## 2015-01-12 NOTE — Progress Notes (Signed)
Patient ID: Brent Wallace, male   DOB: 03-19-1961, 54 y.o.   MRN: [id number]  HPI: Brent Wallace is a 54 y.o.-year-old male, returning for f/u for DM2, dx 2009, non-insulin-dependent, uncontrolled, with complications (ED). Last visit 3 mo ago. He is here with his wife who offers part of the hx.   Since last visit, he has been eating out almost every night for last 2 weeks. He also missed Invokana for few days.   Last hemoglobin A1c was: Lab Results  Component Value Date   HGBA1C 9.6* 09/28/2014   HGBA1C 10.3* 07/20/2014   HGBA1C 8.3* 04/20/2014   Pt is on a regimen of: - Metformin 1000 mg bid >> had occasional diarrhea >> switched to Metformin XR 500 mg bid >> tolerates it well - Glipizide 5 mg bid  - Invokana 100 mg daily in am in 12/2013 Had diarrhea with regular Metformin.  Pt checks his sugars 0-1x a day they are: - am: 113-126, 163 >> 116-140 >> 117-190, 236, 257 (higher doses if misses metformin) >> 148-190, 249 - 2h after b'fast: n/c >> 175 >> n/c - before lunch: n/c >> 135-150 >> 191-230 >> n/c (this is his fasting) - 2h after lunch: 225 >> 132-175 >> 169 >> 153 >> 75, 107 - before dinner: 175 >> 85-137 >> 267 >> 65-124, 161 >> n/c - 2h after dinner: 136 >> n/c >> 89-184 >> 113-243 >> n/c - bedtime: 105-165 >> n/c No lows. Lowest sugar was 65 >> 68; ? he has hypoglycemia awareness. Highest sugar was 272 >> 201 >> 257 >> 249 (forgot med)  - no CKD, last BUN/creatinine:  Lab Results  Component Value Date   BUN 15 09/28/2014   CREATININE 0.96 09/28/2014  On Lisinopril 10. ACR is increasing:   - last set of lipids: Lab Results  Component Value Date   CHOL 149 09/28/2014   HDL 50.30 09/28/2014   LDLCALC 83 09/28/2014   TRIG 78.0 09/28/2014   CHOLHDL 3 09/28/2014  Not on Statin. - last dilated eye exam was on 06/27/2014 (Dr Gevena Cotton [facility]). No DR. Has optic neuritis in right eye.  - no numbness and tingling in his feet.  He also has a history  of OSA - wears CPAP every night; duodenal ulcer from NSAIDs.  I reviewed pt's medications, allergies, PMH, social hx, family hx, and changes were documented in the history of present illness. Otherwise, unchanged from my initial visit note.  ROS: Constitutional: no weight loss, no fatigue, + increase urination with Invokana Eyes: no blurry vision, no xerophthalmia ENT: no sore throat, no nodules palpated in throat, no dysphagia/odynophagia, no hoarseness Cardiovascular: no CP/SOB/palpitations/leg swelling Respiratory: no cough/SOB Gastrointestinal: no N/V/D/C Musculoskeletal: no muscle/ joint aches Skin: no rashes Neurological: no tremors/numbness/tingling/dizziness  PE: BP 140/80 mmHg  Pulse 84  Temp(Src) 98.2 F (36.8 C) (Oral)  Resp 12  Wt 363 lb (164.656 kg)  SpO2 95% Body mass index is 51.33 kg/(m^2). Wt Readings from Last 3 Encounters:  01/12/15 363 lb (164.656 kg)  10/11/14 359 lb (162.841 kg)  09/28/14 361 lb 14.4 oz (164.157 kg)   Constitutional: obese, in NAD Eyes: PERRLA, EOMI, no exophthalmos ENT: moist mucous membranes, no thyromegaly, no cervical lymphadenopathy; mallampati 4 Cardiovascular: RRR, No MRG Respiratory: CTA B Gastrointestinal: abdomen soft, NT, ND, BS+ Musculoskeletal: no deformities, strength intact in all 4 Skin: moist, warm, no rashes  ASSESSMENT: 1. DM2, non-insulin-dependent, uncontrolled, with complications - Erectile dysfunction  2. Microalbuminuria -  no DR, so unlikely 2/2 DM; could be 2/2 HTN (?)  PLAN:  1. Patient with long-standing diabetes, on oral antidiabetic regimen, with improved control after adding Invokana, but still high sugars in am  - he eats out a lot, skips Invokana... >> advised to try to improve this. We will also move Glipizide before meals - double dose before dinner if he has a larger dinner. - I suggested to:  Patient Instructions  Please continue: - Metformin XR 500 mg 2x a day - Glipizide 5 mg 2x a day before  meals. For a larger meal at dinner, try to take 2 glipizide tablets before that meal. - Invokana 100 mg daily in am   Please stop at the lab.  Please return in 3 months with your sugar log.   - Up to date with eye exams - given more sugar logs - will check HbA1c today >> 7.9% (greatly decreased) - Return to clinic in 3 mo with sugar log   2. MAU - ACR is increasing despite improvement in DM - reviewed this with pt and wife - On Lisinopril 10  - started "years ago" - will recheck today, if worsening >> may need nephrology referral - GFR not decreased  Orders Only on 01/12/2015  Component Date Value Ref Range Status  . Hemoglobin A1C 01/12/2015 7.0   Final  Office Visit on 01/12/2015  Component Date Value Ref Range Status  . Microalb, Ur 01/12/2015 2.5* 0.0 - 1.9 mg/dL Final  . Creatinine,U 01/12/2015 41.1   Final  . Microalb Creat Ratio 01/12/2015 6.1  0.0 - 30.0 mg/g Final   Tests greatly improved!

## 2015-01-12 NOTE — Patient Instructions (Signed)
Please continue: - Metformin XR 500 mg 2x a day - Glipizide 5 mg 2x a day before meals. For a larger meal at dinner, try to take 2 glipizide tablets before that meal. - Invokana 100 mg daily in am   Please stop at the lab.  Please return in 3 months with your sugar log.

## 2015-01-13 LAB — MICROALBUMIN / CREATININE URINE RATIO
CREATININE, U: 41.1 mg/dL
MICROALB UR: 2.5 mg/dL — AB (ref 0.0–1.9)
MICROALB/CREAT RATIO: 6.1 mg/g (ref 0.0–30.0)

## 2015-02-09 ENCOUNTER — Telehealth: Payer: Self-pay | Admitting: Internal Medicine

## 2015-02-09 MED ORDER — METFORMIN HCL ER 500 MG PO TB24: 500.0000 mg | ORAL_TABLET | Freq: Two times a day (BID) | ORAL | Status: DC

## 2015-02-09 NOTE — Telephone Encounter (Signed)
Pt needs refill on metformin

## 2015-02-15 ENCOUNTER — Encounter (HOSPITAL_BASED_OUTPATIENT_CLINIC_OR_DEPARTMENT_OTHER): Payer: Self-pay | Admitting: Emergency Medicine

## 2015-02-15 ENCOUNTER — Emergency Department (HOSPITAL_BASED_OUTPATIENT_CLINIC_OR_DEPARTMENT_OTHER): Payer: 59

## 2015-02-15 ENCOUNTER — Emergency Department (HOSPITAL_BASED_OUTPATIENT_CLINIC_OR_DEPARTMENT_OTHER)
Admission: EM | Admit: 2015-02-15 | Discharge: 2015-02-16 | Disposition: A | Discharge status: Home / Self Care | Payer: 59 | Attending: Emergency Medicine | Admitting: Emergency Medicine

## 2015-02-15 DIAGNOSIS — Z880 Allergy status to penicillin: Secondary | ICD-10-CM | POA: Insufficient documentation

## 2015-02-15 DIAGNOSIS — E669 Obesity, unspecified: Secondary | ICD-10-CM | POA: Diagnosis not present

## 2015-02-15 DIAGNOSIS — E119 Type 2 diabetes mellitus without complications: Secondary | ICD-10-CM | POA: Diagnosis not present

## 2015-02-15 DIAGNOSIS — I10 Essential (primary) hypertension: Secondary | ICD-10-CM | POA: Insufficient documentation

## 2015-02-15 DIAGNOSIS — Z8669 Personal history of other diseases of the nervous system and sense organs: Secondary | ICD-10-CM | POA: Diagnosis not present

## 2015-02-15 DIAGNOSIS — T50995A Adverse effect of other drugs, medicaments and biological substances, initial encounter: Secondary | ICD-10-CM | POA: Diagnosis not present

## 2015-02-15 DIAGNOSIS — K853 Drug induced acute pancreatitis without necrosis or infection: Secondary | ICD-10-CM

## 2015-02-15 DIAGNOSIS — R1011 Right upper quadrant pain: Secondary | ICD-10-CM | POA: Diagnosis present

## 2015-02-15 DIAGNOSIS — Z720 Tobacco use: Secondary | ICD-10-CM | POA: Insufficient documentation

## 2015-02-15 DIAGNOSIS — Z79899 Other long term (current) drug therapy: Secondary | ICD-10-CM | POA: Diagnosis not present

## 2015-02-15 DIAGNOSIS — Z86010 Personal history of colonic polyps: Secondary | ICD-10-CM | POA: Diagnosis not present

## 2015-02-15 LAB — CBC WITH DIFFERENTIAL/PLATELET
Basophils Absolute: 0 10*3/uL (ref 0.0–0.1)
Basophils Relative: 0 % (ref 0–1)
EOS PCT: 1 % (ref 0–5)
Eosinophils Absolute: 0.1 10*3/uL (ref 0.0–0.7)
HCT: 50.9 % (ref 39.0–52.0)
Hemoglobin: 17.4 g/dL — ABNORMAL HIGH (ref 13.0–17.0)
Lymphocytes Relative: 25 % (ref 12–46)
Lymphs Abs: 2.3 10*3/uL (ref 0.7–4.0)
MCH: 29.4 pg (ref 26.0–34.0)
MCHC: 34.2 g/dL (ref 30.0–36.0)
MCV: 86.1 fL (ref 78.0–100.0)
MONOS PCT: 7 % (ref 3–12)
Monocytes Absolute: 0.7 10*3/uL (ref 0.1–1.0)
Neutro Abs: 6.1 10*3/uL (ref 1.7–7.7)
Neutrophils Relative %: 67 % (ref 43–77)
Platelets: 203 10*3/uL (ref 150–400)
RBC: 5.91 MIL/uL — AB (ref 4.22–5.81)
RDW: 13.7 % (ref 11.5–15.5)
WBC: 9.3 10*3/uL (ref 4.0–10.5)

## 2015-02-15 LAB — COMPREHENSIVE METABOLIC PANEL
ALT: 18 U/L (ref 17–63)
ANION GAP: 11 (ref 5–15)
AST: 17 U/L (ref 15–41)
Albumin: 4.1 g/dL (ref 3.5–5.0)
Alkaline Phosphatase: 78 U/L (ref 38–126)
BUN: 11 mg/dL (ref 6–20)
CHLORIDE: 102 mmol/L (ref 101–111)
CO2: 25 mmol/L (ref 22–32)
Calcium: 9.4 mg/dL (ref 8.9–10.3)
Creatinine, Ser: 0.81 mg/dL (ref 0.61–1.24)
GFR calc non Af Amer: >60 mL/min (ref >60)
Glucose, Bld: 149 mg/dL — ABNORMAL HIGH (ref 65–99)
POTASSIUM: 4 mmol/L (ref 3.5–5.1)
SODIUM: 138 mmol/L (ref 135–145)
Total Bilirubin: 0.7 mg/dL (ref 0.3–1.2)
Total Protein: 8 g/dL (ref 6.5–8.1)

## 2015-02-15 LAB — LIPASE, BLOOD: Lipase: 125 U/L — ABNORMAL HIGH (ref 22–51)

## 2015-02-15 NOTE — ED Notes (Signed)
Pt c/o RUQ abd pain intermittent since last week. Pt states pain worsened today after eating chips and salsa.

## 2015-02-15 NOTE — ED Notes (Signed)
Patient transported to Ultrasound 

## 2015-02-16 ENCOUNTER — Telehealth: Payer: Self-pay | Admitting: Family Medicine

## 2015-02-16 ENCOUNTER — Telehealth: Payer: Self-pay | Admitting: *Deleted

## 2015-02-16 ENCOUNTER — Emergency Department (HOSPITAL_BASED_OUTPATIENT_CLINIC_OR_DEPARTMENT_OTHER): Payer: 59

## 2015-02-16 MED ORDER — IOHEXOL 300 MG/ML  SOLN
100.0000 mL | Freq: Once | INTRAMUSCULAR | Status: AC | PRN
  Administered 2015-02-16: 100 mL via INTRAVENOUS

## 2015-02-16 MED ORDER — IOHEXOL 300 MG/ML  SOLN
50.0000 mL | Freq: Once | INTRAMUSCULAR | Status: AC | PRN
  Administered 2015-02-16: 50 mL via ORAL

## 2015-02-16 MED ORDER — ONDANSETRON HCL 4 MG/2ML IJ SOLN
4.0000 mg | Freq: Once | INTRAMUSCULAR | Status: AC
  Administered 2015-02-16: 4 mg via INTRAVENOUS
  Filled 2015-02-16: qty 2

## 2015-02-16 NOTE — Telephone Encounter (Signed)
Called pt's wife and advised her per Dr Gherghe's message below. She voiced understanding.  
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
## 2015-02-16 NOTE — Telephone Encounter (Signed)
Pt's wife called stating she had to take pt to the ED last night. Pt began having pain a week ago, then yesterday severe pain. Physician advised to stop the Invokana and no metformin for 2 days (he had a CT scan). Pt is on a liquid diet for 2 days. Pt's blood sugar has been up and down. 1 day it was 400. Last night it was 103. They advised to follow up with PCP and Endocrinologist. Please advise when you feel pt needs to see you. Thank you.

## 2015-02-16 NOTE — Telephone Encounter (Signed)
He can restart Metformin if no nausea 500 mg 2x a day. Please call back with sugars on Mon

## 2015-02-16 NOTE — Telephone Encounter (Signed)
Wife called to advise pt went to ed last night and has acute pancreatitis. They have stopped his invokana and metformin for 2 days. pls advise if need to see dr todd.

## 2015-02-20 ENCOUNTER — Telehealth: Payer: Self-pay | Admitting: Internal Medicine

## 2015-02-20 MED ORDER — INSULIN GLARGINE 100 UNIT/ML SOLOSTAR PEN: 15.0000 [IU] | PEN_INJECTOR | Freq: Every day | SUBCUTANEOUS | Status: DC

## 2015-02-20 MED ORDER — INSULIN PEN NEEDLE 32G X 4 MM MISC: Status: DC

## 2015-02-20 NOTE — Telephone Encounter (Signed)
Please advise below.

## 2015-02-20 NOTE — Telephone Encounter (Signed)
Calling with BS readings: 9/8 1:35 PM 185-went to ER due to pancreatitis 9/9 12:40 AM 345; 2:24 AM 193 after meds; 11:37 AM 212; 3:28 PM 242 9/10 834 AM 205; 5:52 PM 239-started back on metformin 9/11 1:22 PM 246 9/12 2:00 PM 253

## 2015-02-20 NOTE — Addendum Note (Signed)
Addended by: Philemon Kingdom on: 02/20/2015 05:56 PM   Modules accepted: Orders

## 2015-02-20 NOTE — Telephone Encounter (Signed)
I called patient back - he feels much better now and can eat normally. He was told that his pancreatitis episode was from [facility]. The cases of pancreatitis associated with [facility] are exceedingly rare... I am not sure if this was the exact cause of his pancreatitis. His triglycerides are normal. He initially denied drinking alcohol, but then he mentioned that he drinks some but not a lot.  I explained that the sugars are very high and we need to start insulin. I suggested Lantus 15 units at bedtime. Increase by 2 units every 3 days until he calls me back with sugars.  I briefly explained how to do the injections, but he mentions that he is wife is using them and she can help him. I strongly advised him to asked the pharmacist or if he still has questions to schedule a nurse appointment with [place] for correct injection demonstration.  He is to continue his glipizide and metformin for now. I also discussed with him about the importance of having regular meals, which is also determined to start doing.  He will call me with blood sugars (fasting and at bedtime) in one week.

## 2015-02-21 NOTE — Telephone Encounter (Signed)
Per Dr Sherren Mocha if wife would like patient could go to GI.  I spoke with Mrs Cashman and she is okay with patient staying under Dr Sherren Mocha and Dr Lewie Loron care at this time.

## 2015-03-03 ENCOUNTER — Ambulatory Visit: Payer: 59 | Admitting: Internal Medicine

## 2015-04-04 ENCOUNTER — Other Ambulatory Visit: Payer: Self-pay | Admitting: Internal Medicine

## 2015-04-14 ENCOUNTER — Ambulatory Visit: Payer: 59 | Admitting: Internal Medicine

## 2015-04-23 ENCOUNTER — Other Ambulatory Visit: Payer: Self-pay | Admitting: Internal Medicine

## 2015-05-02 ENCOUNTER — Ambulatory Visit: Payer: 59 | Admitting: Internal Medicine

## 2015-05-12 ENCOUNTER — Telehealth: Payer: Self-pay | Admitting: Internal Medicine

## 2015-05-12 NOTE — Telephone Encounter (Signed)
Called pt's wife. Advised her per Dr Arman Filter message below. She voiced understanding and will encourage him to follow through.

## 2015-05-12 NOTE — Telephone Encounter (Signed)
Returned the wife's call, had to lvm to return call.

## 2015-05-12 NOTE — Telephone Encounter (Signed)
First, he needs to come to his appointments - he cancelled the last 2! Second, he needs to take Lantus as Rx'ed. Increase the dose to 20 units and call over the weekend. Give Lantus a little time to work before reevaluating.

## 2015-05-12 NOTE — Telephone Encounter (Signed)
Pt's wife returned call. She stated pt has not been taking the Lantus until last night (first time). Pt's blood sugar fasting in the AM (11:00 am) has been between 220 -280. Pt only checks one time a day. Yesterday it was 240 in the AM at 10 pm it was 248, he did 15 units of Lantus. His blood sugar was 220 this AM. Pt has been to see an orthopedic dr, there is a plan to replace both hips. He had an A1c yesterday, it as 9.4. She says he is finally motivated to do something. Please advise what direction he needs to go in.

## 2015-05-12 NOTE — Telephone Encounter (Signed)
Patient wife ask you to call her she has question

## 2015-05-15 ENCOUNTER — Telehealth: Payer: Self-pay | Admitting: Internal Medicine

## 2015-05-15 NOTE — Telephone Encounter (Signed)
Patient is returning your call.  

## 2015-05-15 NOTE — Telephone Encounter (Signed)
Spoke with pts wife

## 2015-05-24 ENCOUNTER — Ambulatory Visit: Payer: 59 | Admitting: Pulmonary Disease

## 2015-06-20 ENCOUNTER — Ambulatory Visit (INDEPENDENT_AMBULATORY_CARE_PROVIDER_SITE_OTHER): Payer: 59 | Admitting: Internal Medicine

## 2015-06-20 ENCOUNTER — Encounter: Payer: Self-pay | Admitting: Internal Medicine

## 2015-06-20 ENCOUNTER — Other Ambulatory Visit (INDEPENDENT_AMBULATORY_CARE_PROVIDER_SITE_OTHER): Payer: 59 | Admitting: *Deleted

## 2015-06-20 VITALS — BP 114/68 | HR 89 | Temp 98.3°F | Resp 12 | Wt 368.0 lb

## 2015-06-20 DIAGNOSIS — E1169 Type 2 diabetes mellitus with other specified complication: Secondary | ICD-10-CM

## 2015-06-20 LAB — POCT GLYCOSYLATED HEMOGLOBIN (HGB A1C): Hemoglobin A1C: 9.3

## 2015-06-20 MED ORDER — GLIPIZIDE 5 MG PO TABS: ORAL_TABLET | ORAL | Status: DC

## 2015-06-20 MED ORDER — METFORMIN HCL ER 500 MG PO TB24: 500.0000 mg | ORAL_TABLET | Freq: Three times a day (TID) | ORAL | Status: DC

## 2015-06-20 MED ORDER — INSULIN GLARGINE 100 UNIT/ML SOLOSTAR PEN: 26.0000 [IU] | PEN_INJECTOR | Freq: Every day | SUBCUTANEOUS | Status: DC

## 2015-06-20 NOTE — Progress Notes (Signed)
Patient ID: Brent Wallace, male   DOB: 12/20/1960, 55 y.o.   MRN: [id number]  HPI: Brent Wallace is a 55 y.o.-year-old male, returning for f/u for DM2, dx 2009, insulin-dependent, uncontrolled, with complications (ED). Last visit 5 mo ago.   Last hemoglobin A1c was: Lab Results  Component Value Date   HGBA1C 7.0 01/12/2015   HGBA1C 9.6* 09/28/2014   HGBA1C 10.3* 07/20/2014   Pt is on a regimen of: - Metformin XR 500 mg bid >> tolerates it well - Glipizide 5 mg bid  - Lantus 21 units at bedtime Invokana 100 mg daily was stopped as it was considered the culprit for pancreatitis - 02/2015 (?)  Had diarrhea with regular Metformin and 1000 mg Metformin ER.  Pt checks his sugars 0-1x a day they are HIGH, especially in am: - am: 116-140 >> 117-190, 236, 257 (higher doses if misses metformin) >> 148-190, 249 >> 162-234 - 2h after b'fast: n/c >> 175 >> n/c - before lunch: n/c >> 135-150 >> 191-230 >> n/c - 2h after lunch: 225 >> 132-175 >> 169 >> 153 >> 75, 107 >> n/c - before dinner: 175 >> 85-137 >> 267 >> 65-124, 161 >> n/c >> 123-178, 291 - 2h after dinner: 136 >> n/c >> 89-184 >> 113-243 >> n/c - bedtime: 105-165 >> n/c No lows. Lowest sugar was 65 >> 68 >> 123; ? he has hypoglycemia awareness. Highest sugar was 272 >> 201 >> 257 >> 249 (forgot med) >> 285  - no CKD, last BUN/creatinine:  Lab Results  Component Value Date   BUN 11 02/15/2015   CREATININE 0.81 02/15/2015  On Lisinopril 10. ACR better at last visit: Component     Latest Ref Rng 01/12/2015  Microalb, Ur     0.0 - 1.9 mg/dL 2.5 (H)  Creatinine,U      41.1  MICROALB/CREAT RATIO     0.0 - 30.0 mg/g 6.1   - last set of lipids: Lab Results  Component Value Date   CHOL 149 09/28/2014   HDL 50.30 09/28/2014   LDLCALC 83 09/28/2014   TRIG 78.0 09/28/2014   CHOLHDL 3 09/28/2014  Not on Statin. - last dilated eye exam was on 06/27/2014 (Dr Gevena Cotton [facility]). No DR. Has optic neuritis in right  eye.  - no numbness and tingling in his feet.  He also has a history of OSA - wears CPAP every night; duodenal ulcer from NSAIDs.  I reviewed pt's medications, allergies, PMH, social hx, family hx, and changes were documented in the history of present illness. Otherwise, unchanged from my initial visit note.  ROS: Constitutional: no weight loss, no fatigue Eyes: no blurry vision, no xerophthalmia ENT: no sore throat, no nodules palpated in throat, no dysphagia/odynophagia, no hoarseness Cardiovascular: no CP/SOB/palpitations/leg swelling Respiratory: no cough/SOB Gastrointestinal: no N/V/D/C Musculoskeletal: no muscle/+ joint aches Skin: no rashes Neurological: no tremors/numbness/tingling/dizziness  PE: BP 114/68 mmHg  Pulse 89  Temp(Src) 98.3 F (36.8 C) (Oral)  Resp 12  Wt 368 lb (166.924 kg)  SpO2 96% Body mass index is 52.8 kg/(m^2). Wt Readings from Last 3 Encounters:  06/20/15 368 lb (166.924 kg)  02/15/15 360 lb (163.295 kg)  01/12/15 363 lb (164.656 kg)   Constitutional: obese, in NAD Eyes: PERRLA, EOMI, no exophthalmos ENT: moist mucous membranes, no thyromegaly, no cervical lymphadenopathy; mallampati 4 Cardiovascular: RRR, No MRG Respiratory: CTA B Gastrointestinal: abdomen soft, NT, ND, BS+ Musculoskeletal: no deformities, strength intact in all 4 Skin:  moist, warm, no rashes  ASSESSMENT: 1. DM2, non-insulin-dependent, uncontrolled, with complications - Erectile dysfunction  2. Microalbuminuria - no DR, so unlikely 2/2 DM; could be 2/2 HTN (?)  PLAN:  1. Patient with long-standing diabetes, on oral antidiabetic regimen, now with much worse control after stopping Invokana, despite starting basal insulin. Will increase all 3 DM meds. - I suggested to:  Patient Instructions  Please increase: - Metformin XR 500 mg to 3x a day, with a meal or a snack - Glipizide to 10 mg 2x a day, before meals - Lantus to 26 units at bedtime  Please return in 1.5 months  with your sugar log.   - Up to date with eye exams - given more sugar logs - he will need B hip replacement >> will need to lose weight first - check HbA1c today >> 9.3% (greatly increased) - Return to clinic in 1.5 mo with sugar log   2. MAU - ACR better at last check - On Lisinopril 10  - started "years ago"

## 2015-06-20 NOTE — Patient Instructions (Signed)
Please increase: - Metformin XR 500 mg to 3x a day, with a meal or a snack - Glipizide to 10 mg 2x a day, before meals - Lantus to 26 units at bedtime  Please return in 1.5 months with your sugar log.

## 2015-07-14 ENCOUNTER — Other Ambulatory Visit: Payer: Self-pay | Admitting: *Deleted

## 2015-07-14 MED ORDER — GLUCOSE BLOOD VI STRP
ORAL_STRIP | Status: DC
Start: 2015-07-14 — End: 2016-02-14

## 2015-08-03 ENCOUNTER — Ambulatory Visit (INDEPENDENT_AMBULATORY_CARE_PROVIDER_SITE_OTHER): Payer: 59 | Admitting: Internal Medicine

## 2015-08-03 ENCOUNTER — Encounter: Payer: Self-pay | Admitting: Internal Medicine

## 2015-08-03 VITALS — BP 126/80 | HR 91 | Temp 98.2°F | Resp 12 | Wt 371.0 lb

## 2015-08-03 DIAGNOSIS — N521 Erectile dysfunction due to diseases classified elsewhere: Secondary | ICD-10-CM | POA: Diagnosis not present

## 2015-08-03 DIAGNOSIS — E1159 Type 2 diabetes mellitus with other circulatory complications: Secondary | ICD-10-CM | POA: Diagnosis not present

## 2015-08-03 DIAGNOSIS — E119 Type 2 diabetes mellitus without complications: Secondary | ICD-10-CM | POA: Insufficient documentation

## 2015-08-03 NOTE — Progress Notes (Signed)
Patient ID: GUNAR TRIMMER, male   DOB: Dec 01, 1960, 55 y.o.   MRN: [id number]  HPI: Brent Wallace is a 55 y.o.-year-old male, returning for f/u for DM2, dx 2009, insulin-dependent, uncontrolled, with complications (ED). Last visit 1.5 mo ago.   Last hemoglobin A1c was: Lab Results  Component Value Date   HGBA1C 9.3 06/20/2015   HGBA1C 7.0 01/12/2015   HGBA1C 9.6* 09/28/2014  Steroid shot in 05/2015.  Pt is on a regimen of (all doses increased at last visit): - Metformin XR 500 mg 2-3x a day, with a meal or a snack - Glipizide 10 mg 2x a day, before meals - Lantus 26 units at bedtime Invokana 100 mg daily was stopped as it was considered the culprit for pancreatitis - 02/2015 (?)  Had diarrhea with regular Metformin and 1000 mg Metformin ER.  Pt checks his sugars 2x a day they are: - am: 116-140 >> 117-190, 236, 257 (higher doses if misses metformin) >> 148-190, 249 >> 162-234 >> 90, 102-145, 160 - 2h after b'fast: n/c >> 175 >> n/c - before lunch: n/c >> 135-150 >> 191-230 >> n/c >> 121-148, 155, 229 - 2h after lunch: 225 >> 132-175 >> 169 >> 153 >> 75, 107 >> n/c - before dinner: 175 >> 85-137 >> 267 >> 65-124, 161 >> n/c >> 123-178, 291 >> 79, 121, 200 - 2h after dinner: 136 >> n/c >> 89-184 >> 113-243 >> n/c >> 179 - bedtime: 105-165 >> n/c No lows. Lowest sugar was 65 >> 68 >> 123 >> 90; ? he has hypoglycemia awareness. Highest sugar was 272 >> 201 >> 257 >> 249 (forgot med) >> 285 >> 200  - no CKD, last BUN/creatinine:  Lab Results  Component Value Date   BUN 11 02/15/2015   CREATININE 0.81 02/15/2015  On Lisinopril 10. ACR better at last visit: Component     Latest Ref Rng 01/12/2015  Microalb, Ur     0.0 - 1.9 mg/dL 2.5 (H)  Creatinine,U      41.1  MICROALB/CREAT RATIO     0.0 - 30.0 mg/g 6.1   - last set of lipids: Lab Results  Component Value Date   CHOL 149 09/28/2014   HDL 50.30 09/28/2014   LDLCALC 83 09/28/2014   TRIG 78.0 09/28/2014   CHOLHDL 3  09/28/2014  Not on Statin. - last dilated eye exam was on 06/27/2014 (Dr Gevena Cotton [facility]). No DR. Has optic neuritis in right eye.  - no numbness and tingling in his feet.  He also has a history of OSA - wears CPAP every night; duodenal ulcer from NSAIDs.  I reviewed pt's medications, allergies, PMH, social hx, family hx, and changes were documented in the history of present illness. Otherwise, unchanged from my initial visit note.  ROS: Constitutional: no weight loss, no fatigue Eyes: no blurry vision, no xerophthalmia ENT: no sore throat, no nodules palpated in throat, no dysphagia/odynophagia, no hoarseness Cardiovascular: no CP/SOB/palpitations/leg swelling Respiratory: no cough/SOB Gastrointestinal: no N/V/D/C Musculoskeletal: no muscle/+ joint aches (hips) Skin: no rashes Neurological: no tremors/numbness/tingling/dizziness  PE: BP 126/80 mmHg  Pulse 91  Temp(Src) 98.2 F (36.8 C) (Oral)  Resp 12  Wt 371 lb (168.284 kg)  SpO2 94% Body mass index is 53.23 kg/(m^2). Wt Readings from Last 3 Encounters:  08/03/15 371 lb (168.284 kg)  06/20/15 368 lb (166.924 kg)  02/15/15 360 lb (163.295 kg)   Constitutional: obese, in NAD Eyes: PERRLA, EOMI, no exophthalmos ENT:  moist mucous membranes, no thyromegaly, no cervical lymphadenopathy; mallampati 4 Cardiovascular: RRR, No MRG Respiratory: CTA B Gastrointestinal: abdomen soft, NT, ND, BS+ Musculoskeletal: no deformities, strength intact in all 4 Skin: moist, warm, no rashes  ASSESSMENT: 1. DM2, non-insulin-dependent, uncontrolled, with complications - Erectile dysfunction  2. Microalbuminuria - no DR, so unlikely 2/2 DM; could be 2/2 HTN (?)  PLAN:  1. Patient with long-standing diabetes, on oral antidiabetic regimen, with much worse control after stopping Invokana, despite starting basal insulin, but also after his latest steroid inj. At last visit, we increased all 3 DM meds >> sugars much better  now.  - Wife inquires about DM meds that can help with weight loss. We cannot use Invokana as it was believed that this caused his pancreatitis episode in 02/2015. We also cannot use DPP4 inh and GLP1 R agonists b/c h/o pancreatitis. - I suggested to:  Patient Instructions  Please continue: - Metformin XR 500 mg 2-3x a day, with a meal or a snack - Glipizide 10 mg 2x a day, before meals - Lantus 26 units at bedtime  Please check some sugars at bedtime (2x a week).  Let me know if you plan to have another steroid injection >> we may need mealtime insulin then.  Please come back for a follow-up appointment in 3 months.  - needs a new eye exam >> advised to schedule - he will need B hip replacement >> will need to lose weight first - Return to clinic in 3 mo with sugar log

## 2015-08-03 NOTE — Patient Instructions (Signed)
Please continue: - Metformin XR 500 mg 2-3x a day, with a meal or a snack - Glipizide 10 mg 2x a day, before meals - Lantus 26 units at bedtime  Please check some sugars at bedtime (2x a week).  Let me know if you plan to have another steroid injection >> we may need mealtime insulin then.  Please come back for a follow-up appointment in 3 months.

## 2015-08-12 ENCOUNTER — Other Ambulatory Visit: Payer: Self-pay | Admitting: Internal Medicine

## 2015-08-30 ENCOUNTER — Encounter: Payer: Self-pay | Admitting: Pulmonary Disease

## 2015-08-30 ENCOUNTER — Ambulatory Visit (INDEPENDENT_AMBULATORY_CARE_PROVIDER_SITE_OTHER): Payer: 59 | Admitting: Pulmonary Disease

## 2015-08-30 VITALS — BP 138/98 | HR 84 | Ht 70.0 in | Wt 369.0 lb

## 2015-08-30 DIAGNOSIS — G4733 Obstructive sleep apnea (adult) (pediatric): Secondary | ICD-10-CM | POA: Diagnosis not present

## 2015-08-30 DIAGNOSIS — I10 Essential (primary) hypertension: Secondary | ICD-10-CM

## 2015-08-30 NOTE — Progress Notes (Signed)
   Subjective:    Patient ID: Brent Wallace, male    DOB: 1961/01/04, 55 y.o.   MRN: [id number]  HPI Smoker, for FU of severe OSA  Chief Complaint  Patient presents with  . Follow-up    Former [facility] patient; doing well on new CPAP; no concerns.   BP diastolic high today-no headaches or blurred vision This CPAP since 2013  FF mask ok, pr ok No dryness Wt unchanged, started insulin Download 08/2015 >>on 17 cm, no residuals, good usage   NPSG 2004:  AHI 96/hr with desat to 50% Auto 2006:  Optimal pressure 17cm.   Review of Systems Patient denies significant dyspnea,cough, hemoptysis,  chest pain, palpitations, pedal edema, orthopnea, paroxysmal nocturnal dyspnea, lightheadedness, nausea, vomiting, abdominal or  leg pains      Objective:   Physical Exam  Gen. Pleasant, obese, in no distress ENT - no lesions, no post nasal drip Neck: No JVD, no thyromegaly, no carotid bruits Lungs: no use of accessory muscles, no dullness to percussion, decreased without rales or rhonchi  Cardiovascular: Rhythm regular, heart sounds  normal, no murmurs or gallops, no peripheral edema Musculoskeletal: No deformities, no cyanosis or clubbing , no tremors       Assessment & Plan:

## 2015-08-30 NOTE — Assessment & Plan Note (Signed)
CPAP is set at 17 cm CPAP supplies will be renewed x 1 year  Weight loss encouraged, compliance with goal of at least 4-6 hrs every night is the expectation. Advised against medications with sedative side effects Cautioned against driving when sleepy - understanding that sleepiness will vary on a day to day basis

## 2015-08-30 NOTE — Patient Instructions (Signed)
Your CPAP is set at 17 cm CPAP supplies will be renewed x 1 year

## 2015-08-31 NOTE — Assessment & Plan Note (Signed)
Blood pressure high today Follow up with PCP

## 2015-09-01 NOTE — Addendum Note (Signed)
Addended by: Mathis Dad on: 09/01/2015 08:31 AM   Modules accepted: Orders

## 2015-09-06 ENCOUNTER — Other Ambulatory Visit: Payer: Self-pay | Admitting: Internal Medicine

## 2015-09-08 ENCOUNTER — Other Ambulatory Visit: Payer: Self-pay | Admitting: Internal Medicine

## 2015-09-12 ENCOUNTER — Encounter: Payer: Self-pay | Admitting: Pulmonary Disease

## 2015-10-31 ENCOUNTER — Other Ambulatory Visit: Payer: Self-pay | Admitting: Internal Medicine

## 2015-10-31 ENCOUNTER — Ambulatory Visit (INDEPENDENT_AMBULATORY_CARE_PROVIDER_SITE_OTHER): Payer: 59 | Admitting: Internal Medicine

## 2015-10-31 ENCOUNTER — Encounter: Payer: Self-pay | Admitting: Internal Medicine

## 2015-10-31 ENCOUNTER — Other Ambulatory Visit (INDEPENDENT_AMBULATORY_CARE_PROVIDER_SITE_OTHER): Payer: 59 | Admitting: *Deleted

## 2015-10-31 VITALS — BP 114/78 | HR 88 | Temp 98.3°F | Resp 12 | Wt 374.6 lb

## 2015-10-31 DIAGNOSIS — E1159 Type 2 diabetes mellitus with other circulatory complications: Secondary | ICD-10-CM

## 2015-10-31 DIAGNOSIS — N521 Erectile dysfunction due to diseases classified elsewhere: Secondary | ICD-10-CM

## 2015-10-31 LAB — POCT GLYCOSYLATED HEMOGLOBIN (HGB A1C): Hemoglobin A1C: 9.7

## 2015-10-31 MED ORDER — INSULIN ASPART 100 UNIT/ML FLEXPEN: 8.0000 [IU] | PEN_INJECTOR | Freq: Three times a day (TID) | SUBCUTANEOUS | Status: DC

## 2015-10-31 MED ORDER — LISINOPRIL-HYDROCHLOROTHIAZIDE 10-12.5 MG PO TABS: 1.0000 | ORAL_TABLET | Freq: Every day | ORAL | Status: DC

## 2015-10-31 NOTE — Progress Notes (Signed)
Patient ID: Brent Wallace, male   DOB: 23-Feb-1961, 55 y.o.   MRN: [id number]  HPI: Brent Wallace is a 55 y.o.-year-old male, returning for f/u for DM2, dx 2009, insulin-dependent, uncontrolled, with complications (ED). Last visit 4 mo ago.   No steroid shots since last visit.  Last hemoglobin A1c was: Lab Results  Component Value Date   HGBA1C 9.3 06/20/2015   HGBA1C 7.0 01/12/2015   HGBA1C 9.6* 09/28/2014  Steroid shot in 05/2015.  Pt is on a regimen of: - Metformin XR 500 mg 2-3x a day, with a meal or a snack - may miss evening dose - Glipizide 5 mg 2x a day, before meals - may miss evening dose - Lantus 26 units at bedtime Invokana 100 mg daily was stopped as it was considered the culprit for pancreatitis - 02/2015 (?)  Had diarrhea with regular Metformin and 1000 mg Metformin ER.  Pt checks his sugars 2x a day they are - did not check any sugars at bedtime despite repeated advice: - am: 117-190, 236, 257 >> 148-190, 249 >> 162-234 >> 90, 102-145, 160 >> 139-237 - 2h after b'fast: n/c >> 175 >> n/c - before lunch: n/c >> 135-150 >> 191-230 >> n/c >> 121-148, 155, 229 >> 145-204, 301 - 2h after lunch: 225 >> 132-175 >> 169 >> 153 >> 75, 107 >> n/c >> 90, 221, 349 - before dinner: 175 >> 85-137 >> 267 >> 65-124, 161 >> n/c >> 123-178, 291 >> 79, 121, 200 >> n/c - 2h after dinner: 136 >> n/c >> 89-184 >> 113-243 >> n/c >> 179 >> n/c - bedtime: 105-165 >> n/c No lows. Lowest sugar was 65 >> 68 >> 123 >> 90 >> 90; ? he has hypoglycemia awareness. Highest sugar was 272 >> 201 >> 257 >> 249 (forgot med) >> 285 >> 200 >> 300s  - no CKD, last BUN/creatinine:  Lab Results  Component Value Date   BUN 11 02/15/2015   CREATININE 0.81 02/15/2015  On Lisinopril 10. ACR better at last visit: Component     Latest Ref Rng 01/12/2015  Microalb, Ur     0.0 - 1.9 mg/dL 2.5 (H)  Creatinine,U      41.1  MICROALB/CREAT RATIO     0.0 - 30.0 mg/g 6.1   - last set of lipids: Lab Results   Component Value Date   CHOL 149 09/28/2014   HDL 50.30 09/28/2014   LDLCALC 83 09/28/2014   TRIG 78.0 09/28/2014   CHOLHDL 3 09/28/2014  Not on Statin. - last dilated eye exam was on 06/27/2014 (Dr Gevena Cotton [facility]). No DR. Has optic neuritis in right eye.  - no numbness and tingling in his feet.  He also has a history of OSA - wears CPAP every night; duodenal ulcer from NSAIDs.  I reviewed pt's medications, allergies, PMH, social hx, family hx, and changes were documented in the history of present illness. Otherwise, unchanged from my initial visit note.  ROS: Constitutional: no weight loss, no fatigue Eyes: no blurry vision, no xerophthalmia ENT: no sore throat, no nodules palpated in throat, no dysphagia/odynophagia, no hoarseness Cardiovascular: no CP/SOB/palpitations/leg swelling Respiratory: no cough/SOB Gastrointestinal: no N/V/D/C Musculoskeletal: no muscle/+ joint aches (hips) Skin: no rashes Neurological: no tremors/numbness/tingling/dizziness  PE: BP 114/78 mmHg  Pulse 88  Temp(Src) 98.3 F (36.8 C) (Oral)  Resp 12  Wt 374 lb 9.6 oz (169.917 kg)  SpO2 94% Body mass index is 53.75 kg/(m^2). Wt Readings  from Last 3 Encounters:  10/31/15 374 lb 9.6 oz (169.917 kg)  08/30/15 369 lb (167.377 kg)  08/03/15 371 lb (168.284 kg)   Constitutional: obese, in NAD Eyes: PERRLA, EOMI, no exophthalmos ENT: moist mucous membranes, no thyromegaly, no cervical lymphadenopathy; mallampati 4 Cardiovascular: RRR, No MRG Respiratory: CTA B Gastrointestinal: abdomen soft, NT, ND, BS+ Musculoskeletal: no deformities, strength intact in all 4 Skin: moist, warm, no rashes  ASSESSMENT: 1. DM2, non-insulin-dependent, uncontrolled, with complications - Erectile dysfunction  2. HTN  PLAN:  1. Patient with long-standing diabetes, on oral antidiabetic regimen, with much worse control after stopping Invokana, despite starting basal insulin. Will need mealtime  insulin and also discussed about improving diet  - at last visit, his wife inquired  about DM meds that can help with weight loss. We cannot use Invokana as it was believed that this caused his pancreatitis episode in 02/2015. We also cannot use DPP4 inh and GLP1 R agonists b/c h/o pancreatitis. - I suggested to:  Patient Instructions  Please continue: - Metformin XR 500 mg 2-3x a day, with a meal or a snack - Glipizide 5 mg 2x a day, before meals  Please increase: - Lantus to 30 units at bedtime  Add:  - Novolog 15 min before a meal, 3x a day:  8 units before a regular meal  10 units before a larger meal of if you have dessert  Please check some sugars at bedtime.  Please come back for a follow-up appointment in 1.5 months.  - needs a new eye exam >> advised to schedule - he will need B hip replacement >> will need to lose weight first - HbA1c today >> higher: 9.7%  - Return to clinic in 3 mo with sugar log   2. HTN: - we stopped his diuretic when we started Invokana >> now off Invokana >> has been taking wife's HCTZ >> will restart.

## 2015-10-31 NOTE — Patient Instructions (Signed)
Patient Instructions  Please continue: - Metformin XR 500 mg 2-3x a day, with a meal or a snack - Glipizide 10 mg 2x a day, before meals  Please increase: - Lantus to 30 units at bedtime  Add:  - Novolog 15 min before a meal:  8 units before a regular meal  10 units before a larger meal of if you have dessert  Please check some sugars at bedtime.  Please come back for a follow-up appointment in 1.5 months.

## 2015-11-01 ENCOUNTER — Other Ambulatory Visit: Payer: Self-pay | Admitting: *Deleted

## 2015-11-01 MED ORDER — INSULIN LISPRO 100 UNIT/ML (KWIKPEN): 8.0000 [IU] | PEN_INJECTOR | Freq: Three times a day (TID) | SUBCUTANEOUS | Status: DC

## 2015-11-01 NOTE — Telephone Encounter (Signed)
Ins no longer covers Novolog. Ok to switch to Humalog per Dr Cruzita Lederer.

## 2015-11-03 ENCOUNTER — Other Ambulatory Visit: Payer: Self-pay | Admitting: Internal Medicine

## 2015-11-03 ENCOUNTER — Other Ambulatory Visit: Payer: Self-pay | Admitting: Family Medicine

## 2015-11-10 ENCOUNTER — Other Ambulatory Visit: Payer: Self-pay | Admitting: Internal Medicine

## 2015-12-14 ENCOUNTER — Other Ambulatory Visit: Payer: Self-pay | Admitting: Internal Medicine

## 2015-12-21 ENCOUNTER — Encounter: Payer: Self-pay | Admitting: Internal Medicine

## 2015-12-21 ENCOUNTER — Telehealth: Payer: Self-pay | Admitting: Family Medicine

## 2015-12-21 ENCOUNTER — Ambulatory Visit (INDEPENDENT_AMBULATORY_CARE_PROVIDER_SITE_OTHER): Payer: 59 | Admitting: Internal Medicine

## 2015-12-21 ENCOUNTER — Ambulatory Visit (INDEPENDENT_AMBULATORY_CARE_PROVIDER_SITE_OTHER)
Admission: RE | Admit: 2015-12-21 | Discharge: 2015-12-21 | Disposition: A | Discharge status: Home / Self Care | Payer: 59 | Source: Ambulatory Visit | Attending: Internal Medicine | Admitting: Internal Medicine

## 2015-12-21 VITALS — BP 124/70 | HR 93 | Temp 99.9°F | Wt 367.8 lb

## 2015-12-21 DIAGNOSIS — R509 Fever, unspecified: Secondary | ICD-10-CM

## 2015-12-21 LAB — POCT URINALYSIS DIP (MANUAL ENTRY)
Bilirubin, UA: NEGATIVE
Blood, UA: NEGATIVE
Glucose, UA: NEGATIVE
Ketones, POC UA: NEGATIVE
LEUKOCYTES UA: NEGATIVE
Nitrite, UA: NEGATIVE
PH UA: 5.5
SPEC GRAV UA: 1.025
UROBILINOGEN UA: 1

## 2015-12-21 LAB — CBC WITH DIFFERENTIAL/PLATELET
Basophils Absolute: 0 10*3/uL (ref 0.0–0.1)
Basophils Relative: 0.3 % (ref 0.0–3.0)
EOS ABS: 0 10*3/uL (ref 0.0–0.7)
Eosinophils Relative: 0.4 % (ref 0.0–5.0)
HCT: 47.2 % (ref 39.0–52.0)
HEMOGLOBIN: 15.9 g/dL (ref 13.0–17.0)
LYMPHS ABS: 1.8 10*3/uL (ref 0.7–4.0)
Lymphocytes Relative: 18.3 % (ref 12.0–46.0)
MCHC: 33.7 g/dL (ref 30.0–36.0)
MCV: 87.8 fl (ref 78.0–100.0)
MONO ABS: 1.1 10*3/uL — AB (ref 0.1–1.0)
Monocytes Relative: 11.1 % (ref 3.0–12.0)
NEUTROS PCT: 69.9 % (ref 43.0–77.0)
Neutro Abs: 6.9 10*3/uL (ref 1.4–7.7)
Platelets: 178 10*3/uL (ref 150.0–400.0)
RBC: 5.37 Mil/uL (ref 4.22–5.81)
RDW: 14.1 % (ref 11.5–15.5)
WBC: 9.9 10*3/uL (ref 4.0–10.5)

## 2015-12-21 LAB — BASIC METABOLIC PANEL
BUN: 16 mg/dL (ref 6–23)
CO2: 28 mEq/L (ref 19–32)
CREATININE: 1.24 mg/dL (ref 0.40–1.50)
Calcium: 9.3 mg/dL (ref 8.4–10.5)
Chloride: 98 mEq/L (ref 96–112)
GFR: 77.43 mL/min (ref >60.00)
Glucose, Bld: 203 mg/dL — ABNORMAL HIGH (ref 70–99)
Potassium: 3.5 mEq/L (ref 3.5–5.1)
Sodium: 135 mEq/L (ref 135–145)

## 2015-12-21 LAB — SEDIMENTATION RATE: SED RATE: 74 mm/h — AB (ref 0–20)

## 2015-12-21 MED ORDER — DOXYCYCLINE HYCLATE 100 MG PO TABS: 100.0000 mg | ORAL_TABLET | Freq: Two times a day (BID) | ORAL | Status: DC

## 2015-12-21 NOTE — Telephone Encounter (Addendum)
Pts wife returned your call concerning xray results

## 2015-12-21 NOTE — Telephone Encounter (Signed)
Patient notified of these results. Patient verbalized understanding.

## 2015-12-21 NOTE — Patient Instructions (Signed)
Will notify you  of labs when available. Get your chest x ray   If no pna then will send in doxycycline incase this is tick related fever . Still could viral but fevers are usually gone in 3 days of this  Dx .   Expect fever to be gone in 48 hours    FU visit next week  Depending on how doing    Or seek emergent care if worse .
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
## 2015-12-25 ENCOUNTER — Encounter: Payer: Self-pay | Admitting: Internal Medicine

## 2015-12-25 ENCOUNTER — Ambulatory Visit (INDEPENDENT_AMBULATORY_CARE_PROVIDER_SITE_OTHER): Payer: 59 | Admitting: Internal Medicine

## 2015-12-25 ENCOUNTER — Telehealth: Payer: Self-pay | Admitting: Internal Medicine

## 2015-12-25 VITALS — BP 138/90 | HR 82 | Temp 98.5°F | Ht 70.0 in | Wt 369.2 lb

## 2015-12-25 DIAGNOSIS — R509 Fever, unspecified: Secondary | ICD-10-CM | POA: Diagnosis not present
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
## 2015-12-25 NOTE — Patient Instructions (Signed)
Continue antibiotic  As planned   And may need to refill med.  Contact us  Wednesday  Am  About status  If fever  Still persisting  We will plan more evaluation.   Blood tests etc.   Depending on how doing.   If worse contact us sooner.

## 2015-12-25 NOTE — Telephone Encounter (Signed)
Patient Name: Brent Wallace  DOB: 14-Nov-1960    Initial Comment husband fever 99.6, last night at bedtime was 101. been having fever off/on about a week ago, saw dr.thursday and put on med. treating him for rocky mtn. spider fever   Nurse Assessment  Nurse: Raphael Gibney, RN, Vanita Ingles Date/Time (Eastern Time): 12/25/2015 11:59:39 AM  Confirm and document reason for call. If symptomatic, describe symptoms. You must click the next button to save text entered. ---[name] is being treated for [facility] spotted Fever and saw doctor on Thursday. He is taking doxycycline BID. Temp last night 101 last night and had tylenol. Temp 99.6. Last night, he had chills. Blood sugar 173  Has the patient traveled out of the country within the last 30 days? ---Not Applicable  Does the patient have any new or worsening symptoms? ---Yes  Will a triage be completed? ---Yes  Related visit to physician within the last 2 weeks? ---Yes  Does the PT have any chronic conditions? (i.e. diabetes, asthma, etc.) ---Yes  List chronic conditions. ---diabetes  Is this a behavioral health or substance abuse call? ---No     Guidelines    Guideline Title Affirmed Question Affirmed Notes  Infection on Antibiotic Follow-up Call [1] Taking antibiotic > 48 hours (2 days) AND [2] fever still present (SAME)    Final Disposition User   Call PCP within 24 Hours Raphael Gibney, RN, Vera    Comments  appt scheduled for 12/25/15 at 3 pm with Dr. Shanon Ace   Referrals  REFERRED TO PCP OFFICE   Disagree/Comply: Comply

## 2015-12-25 NOTE — Telephone Encounter (Signed)
Noted  

## 2015-12-25 NOTE — Progress Notes (Signed)
Pre visit review using our clinic review tool, if applicable. No additional management support is needed unless otherwise documented below in the visit note. 

## 2015-12-27 ENCOUNTER — Telehealth: Payer: Self-pay | Admitting: Internal Medicine

## 2015-12-27 ENCOUNTER — Encounter: Payer: Self-pay | Admitting: Internal Medicine

## 2015-12-27 LAB — ROCKY MTN SPOTTED FVR ABS PNL(IGG+IGM)
RMSF IgG: DETECTED — AB
RMSF IgM: NOT DETECTED

## 2015-12-27 LAB — REFLEX RMSF IGG TITER

## 2015-12-27 NOTE — Telephone Encounter (Signed)
Pt has had a fever for the past week on antibiotics and they suspect rocky mtn spotted fever is the culprit he is cancelling his appt this week but needs to be sure that your 1st available in sept is ok for him to wait that long
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
## 2015-12-27 NOTE — Telephone Encounter (Signed)
Pt scheduled for 12/28/15 @ 10 AM.

## 2015-12-27 NOTE — Telephone Encounter (Signed)
Please have him  Get on schedule for tomorrow for more evalaution

## 2015-12-28 ENCOUNTER — Ambulatory Visit: Payer: 59 | Admitting: Internal Medicine

## 2015-12-28 ENCOUNTER — Encounter: Payer: Self-pay | Admitting: Internal Medicine

## 2015-12-28 ENCOUNTER — Ambulatory Visit (INDEPENDENT_AMBULATORY_CARE_PROVIDER_SITE_OTHER): Payer: 59 | Admitting: Internal Medicine

## 2015-12-28 VITALS — BP 170/96 | HR 81 | Temp 98.8°F | Wt 371.4 lb

## 2015-12-28 DIAGNOSIS — R509 Fever, unspecified: Secondary | ICD-10-CM

## 2015-12-28 LAB — CBC WITH DIFFERENTIAL/PLATELET
BASOS ABS: 0 10*3/uL (ref 0.0–0.1)
Basophils Relative: 0.5 % (ref 0.0–3.0)
EOS ABS: 0.3 10*3/uL (ref 0.0–0.7)
Eosinophils Relative: 2.7 % (ref 0.0–5.0)
HEMATOCRIT: 43.1 % (ref 39.0–52.0)
Hemoglobin: 14.5 g/dL (ref 13.0–17.0)
LYMPHS ABS: 3 10*3/uL (ref 0.7–4.0)
LYMPHS PCT: 30.8 % (ref 12.0–46.0)
MCHC: 33.7 g/dL (ref 30.0–36.0)
MCV: 87.5 fl (ref 78.0–100.0)
MONOS PCT: 8.3 % (ref 3.0–12.0)
Monocytes Absolute: 0.8 10*3/uL (ref 0.1–1.0)
NEUTROS ABS: 5.6 10*3/uL (ref 1.4–7.7)
NEUTROS PCT: 57.7 % (ref 43.0–77.0)
PLATELETS: 324 10*3/uL (ref 150.0–400.0)
RBC: 4.92 Mil/uL (ref 4.22–5.81)
RDW: 13.9 % (ref 11.5–15.5)
WBC: 9.6 10*3/uL (ref 4.0–10.5)

## 2015-12-28 LAB — COMPREHENSIVE METABOLIC PANEL
ALBUMIN: 3.7 g/dL (ref 3.5–5.2)
ALK PHOS: 77 U/L (ref 39–117)
ALT: 21 U/L (ref 0–53)
AST: 16 U/L (ref 0–37)
BUN: 13 mg/dL (ref 6–23)
CALCIUM: 9.3 mg/dL (ref 8.4–10.5)
CHLORIDE: 99 meq/L (ref 96–112)
CO2: 28 mEq/L (ref 19–32)
Creatinine, Ser: 1.12 mg/dL (ref 0.40–1.50)
GFR: 87.07 mL/min (ref >60.00)
Glucose, Bld: 153 mg/dL — ABNORMAL HIGH (ref 70–99)
POTASSIUM: 3.6 meq/L (ref 3.5–5.1)
Sodium: 139 mEq/L (ref 135–145)
TOTAL PROTEIN: 7.3 g/dL (ref 6.0–8.3)
Total Bilirubin: 0.6 mg/dL (ref 0.2–1.2)

## 2015-12-28 LAB — C-REACTIVE PROTEIN: CRP: 10.6 mg/dL (ref 0.5–20.0)

## 2015-12-28 LAB — TSH: TSH: 1.41 u[IU]/mL (ref 0.35–4.50)

## 2015-12-28 MED ORDER — DOXYCYCLINE HYCLATE 100 MG PO TABS: 100.0000 mg | ORAL_TABLET | Freq: Two times a day (BID) | ORAL | Status: DC

## 2015-12-28 NOTE — Telephone Encounter (Signed)
Let's put him on the waiting list and see if I have a cancellation til then.

## 2015-12-28 NOTE — Telephone Encounter (Signed)
Will add patient to waiting list, and will call patient if any cancellations become available. Will call patient to notify him.

## 2015-12-28 NOTE — Patient Instructions (Signed)
Continue the doxycycline for 3 more days (total 10 days rx )   If fever   100.3 and above after that  Contact us . About further plans .   Will notify you  of labs when available. Checking for mono also  The RMSF titer was indeterminate  Borderline positive for past infection .  Send in information after week end about how you are doing . My chart ok .

## 2015-12-29 LAB — EPSTEIN-BARR VIRUS VCA ANTIBODY PANEL
EBV VCA IgG: 70.5 U/mL — ABNORMAL HIGH
EBV VCA IgM: <36 U/mL

## 2016-02-14 ENCOUNTER — Other Ambulatory Visit: Payer: Self-pay | Admitting: Internal Medicine

## 2016-02-15 ENCOUNTER — Encounter: Payer: Self-pay | Admitting: Internal Medicine

## 2016-02-15 ENCOUNTER — Ambulatory Visit (INDEPENDENT_AMBULATORY_CARE_PROVIDER_SITE_OTHER): Payer: 59 | Admitting: Internal Medicine

## 2016-02-15 VITALS — BP 140/94 | HR 105 | Ht 70.5 in | Wt 373.0 lb

## 2016-02-15 DIAGNOSIS — N521 Erectile dysfunction due to diseases classified elsewhere: Secondary | ICD-10-CM

## 2016-02-15 DIAGNOSIS — E1159 Type 2 diabetes mellitus with other circulatory complications: Secondary | ICD-10-CM

## 2016-02-15 LAB — POCT GLYCOSYLATED HEMOGLOBIN (HGB A1C): Hemoglobin A1C: 9.9

## 2016-02-15 MED ORDER — INSULIN LISPRO 100 UNIT/ML (KWIKPEN): 10.0000 [IU] | PEN_INJECTOR | Freq: Three times a day (TID) | SUBCUTANEOUS | 2 refills | Status: DC

## 2016-02-15 MED ORDER — INSULIN GLARGINE 100 UNIT/ML SOLOSTAR PEN: 35.0000 [IU] | PEN_INJECTOR | Freq: Every day | SUBCUTANEOUS | 2 refills | Status: DC

## 2016-02-15 NOTE — Patient Instructions (Addendum)
Please continue: - Metformin XR 500 mg 2-3x a day, with a meal or a snack - Glipizide 5 mg 2x a day, before meals  Please increase: - Lantus to 35 units at bedtime  - Novolog - move this 15 min before a meal, 3x a day:  10 units before a regular meal  12 units before a larger meal of if you have dessert  Please check some sugars at bedtime.  Please come back for a follow-up appointment in 1.5 months.

## 2016-02-15 NOTE — Progress Notes (Signed)
Patient ID: Brent Wallace, male   DOB: [birth year], 55 y.o.   MRN: [id number]  HPI: Brent Wallace is a 55 y.o.-year-old male, returning for f/u for DM2, dx 2009, insulin-dependent, uncontrolled, with complications (ED). Last visit 4 mo ago.   Since last visit, he developed Lyme ds. >> was tx'ed with Doxycycline.  No steroid shots since last visit.  Quit smoking 12/2015.  He is also eliminating the sweets.   Last hemoglobin A1c was: Lab Results  Component Value Date   HGBA1C 9.7 10/31/2015   HGBA1C 9.3 06/20/2015   HGBA1C 7.0 01/12/2015  Steroid shot in 05/2015.  Pt is on a regimen of: - Metformin XR 500 mg 2-3x a day, with a meal or a snack  - Glipizide 5 mg 2x a day, before meals  - Lantus 28 units at bedtime  - Novolog 15 min before a meal,  - usually once a day (not 3x a day, and takes it after meals!)  8 units before a regular meal  10 units before a larger meal of if you have dessert Invokana 100 mg daily was stopped as it was considered the culprit for pancreatitis - 02/2015 (?)  Had diarrhea with regular Metformin and 1000 mg Metformin ER.  Pt checks his sugars 2x a day they are: - am: 117-190, 236, 257 >> 148-190, 249 >> 162-234 >> 90, 102-145, 160 >> 139-237 >> 135-153 - 2h after b'fast: n/c >> 175 >> n/c - before lunch: n/c >> 135-150 >> 191-230 >> n/c >> 121-148, 155, 229 >> 145-204, 301 >> 173-253, 275, 543 - 2h after lunch: 225 >> 132-175 >> 169 >> 153 >> 75, 107 >> n/c >> 90, 221, 349 >> n/c - before dinner: 175 >> 85-137 >> 267 >> 65-124, 161 >> n/c >> 123-178, 291 >> 79, 121, 200 >> n/c - 2h after dinner: 136 >> n/c >> 89-184 >> 113-243 >> n/c >> 179 >> n/c >> 226 - bedtime: 105-165 >> n/c No lows. Lowest sugar was 65 >> 68 >> 123 >> 90 >> 90 >> 135; ? he has hypoglycemia awareness. Highest sugar was 272 >> 201 >> 257 >> 249 (forgot med) >> 285 >> 200 >> 300s >> 543  - no CKD, last BUN/creatinine:  Lab Results  Component Value Date   BUN 13 12/28/2015    CREATININE 1.12 12/28/2015  On Lisinopril 10. ACR better at last visit: Component     Latest Ref Rng 01/12/2015  Microalb, Ur     0.0 - 1.9 mg/dL 2.5 (H)  Creatinine,U      41.1  MICROALB/CREAT RATIO     0.0 - 30.0 mg/g 6.1   - last set of lipids: Lab Results  Component Value Date   CHOL 149 09/28/2014   HDL 50.30 09/28/2014   LDLCALC 83 09/28/2014   TRIG 78.0 09/28/2014   CHOLHDL 3 09/28/2014  Not on Statin. - last dilated eye exam was on 06/27/2014 (Dr Gevena Cotton [facility]). No DR. Has optic neuritis in right eye.  - no numbness and tingling in his feet.  He also has a history of OSA - wears CPAP every night; duodenal ulcer from NSAIDs.  I reviewed pt's medications, allergies, PMH, social hx, family hx, and changes were documented in the history of present illness. Otherwise, unchanged from my initial visit note.  ROS: Constitutional: no weight loss, no fatigue Eyes: no blurry vision, no xerophthalmia ENT: no sore throat, no nodules palpated in throat, no  dysphagia/odynophagia, no hoarseness Cardiovascular: no CP/SOB/palpitations/leg swelling Respiratory: no cough/SOB Gastrointestinal: no N/V/D/C Musculoskeletal: no muscle/+ joint aches (hips) Skin: no rashes Neurological: no tremors/numbness/tingling/dizziness  PE: BP (!) 140/94 (BP Location: Left Arm, Patient Position: Sitting)   Pulse (!) 105   Ht 5' 10.5" (1.791 m)   Wt (!) 373 lb (169.2 kg)   SpO2 95%   BMI 52.76 kg/m  Body mass index is 52.76 kg/m. Wt Readings from Last 3 Encounters:  02/15/16 (!) 373 lb (169.2 kg)  12/28/15 (!) 371 lb 6.4 oz (168.5 kg)  12/25/15 (!) 369 lb 4 oz (167.5 kg)   Constitutional: obese, in NAD Eyes: PERRLA, EOMI, no exophthalmos ENT: moist mucous membranes, no thyromegaly, no cervical lymphadenopathy; mallampati 4 Cardiovascular: RRR, No MRG Respiratory: CTA B Gastrointestinal: abdomen soft, NT, ND, BS+ Musculoskeletal: no deformities, strength intact in all  4 Skin: moist, warm, no rashes  ASSESSMENT: 1. DM2, insulin-dependent, uncontrolled, with complications - Erectile dysfunction  PLAN:  1. Patient with long-standing diabetes, on oral antidiabetic regimen, with much worse control after stopping Invokana, despite starting basal insulin. We added mealtime insulin and also discussed about improving diet at last visit. Problems: 1. He did not increase Lantus as advised 2. He is taking NovoLog 1x a day not 2-3x a day as advised 3. He is taking NovoLog after dinner, not before 4. He checks sugars only when he wakes up, not later 5. He still eats candy and drinks regular lemonade We addressed all of the above points >> will start Novolog before all 3 meals, increase Lantus and advised for reducing sugars in diet. - We cannot use Invokana as it was believed that this caused his pancreatitis episode in 02/2015. We also cannot use DPP4 inh and GLP1 R agonists b/c h/o pancreatitis. - I suggested to:  Patient Instructions  Please continue: - Metformin XR 500 mg 2-3x a day, with a meal or a snack - Glipizide 5 mg 2x a day, before meals  Please increase: - Lantus to 35 units at bedtime  - Novolog - move this 15 min before a meal, 3x a day:  10 units before a regular meal  12 units before a larger meal of if you have dessert  Please check some sugars at bedtime.  Please come back for a follow-up appointment in 1.5 months.  - needs a new eye exam  - he will need B hip replacement >> will need to lose weight first - HbA1c today >> higher: 9.9%  - Return to clinic in 3 mo with sugar log    Philemon Kingdom, MD PhD [facility] Endocrinology

## 2016-02-15 NOTE — Addendum Note (Signed)
Addended by: Caprice Beaver T on: 02/15/2016 04:27 PM   Modules accepted: Orders

## 2016-04-08 ENCOUNTER — Other Ambulatory Visit: Payer: Self-pay | Admitting: Internal Medicine

## 2016-04-12 ENCOUNTER — Ambulatory Visit: Payer: 59 | Admitting: Internal Medicine

## 2016-04-18 ENCOUNTER — Other Ambulatory Visit: Payer: Self-pay | Admitting: Internal Medicine

## 2016-04-19 ENCOUNTER — Other Ambulatory Visit: Payer: Self-pay | Admitting: Internal Medicine

## 2016-05-18 ENCOUNTER — Other Ambulatory Visit: Payer: Self-pay | Admitting: Internal Medicine

## 2016-05-27 ENCOUNTER — Other Ambulatory Visit: Payer: Self-pay

## 2016-05-27 MED ORDER — GLIPIZIDE 5 MG PO TABS: ORAL_TABLET | ORAL | 0 refills | Status: DC

## 2016-06-15 ENCOUNTER — Other Ambulatory Visit: Payer: Self-pay | Admitting: Internal Medicine

## 2016-07-29 ENCOUNTER — Other Ambulatory Visit: Payer: Self-pay | Admitting: Internal Medicine

## 2016-08-29 ENCOUNTER — Other Ambulatory Visit: Payer: Self-pay | Admitting: Internal Medicine

## 2016-09-05 ENCOUNTER — Telehealth: Payer: Self-pay | Admitting: Internal Medicine

## 2016-09-05 MED ORDER — INSULIN GLARGINE 100 UNIT/ML SOLOSTAR PEN: 35.0000 [IU] | PEN_INJECTOR | Freq: Every day | SUBCUTANEOUS | 1 refills | Status: DC

## 2016-09-05 NOTE — Telephone Encounter (Signed)
Refill submitted. 

## 2016-09-05 NOTE — Telephone Encounter (Signed)
Pt's wife called in and requested a refill for Pt's Lantus to be sent to [facility] on [address].  He is scheduled to come in the end of the month.

## 2016-09-27 ENCOUNTER — Other Ambulatory Visit: Payer: Self-pay | Admitting: Internal Medicine

## 2016-10-03 ENCOUNTER — Encounter: Payer: Self-pay | Admitting: Internal Medicine

## 2016-10-03 ENCOUNTER — Ambulatory Visit (INDEPENDENT_AMBULATORY_CARE_PROVIDER_SITE_OTHER): Payer: 59 | Admitting: Internal Medicine

## 2016-10-03 VITALS — BP 142/100 | HR 82 | Ht 70.0 in | Wt 378.0 lb

## 2016-10-03 DIAGNOSIS — N521 Erectile dysfunction due to diseases classified elsewhere: Secondary | ICD-10-CM

## 2016-10-03 DIAGNOSIS — E1159 Type 2 diabetes mellitus with other circulatory complications: Secondary | ICD-10-CM | POA: Diagnosis not present

## 2016-10-03 LAB — POCT GLYCOSYLATED HEMOGLOBIN (HGB A1C): Hemoglobin A1C: 7.7

## 2016-10-03 MED ORDER — INSULIN GLARGINE 100 UNIT/ML SOLOSTAR PEN: 25.0000 [IU] | PEN_INJECTOR | Freq: Every day | SUBCUTANEOUS | 11 refills | Status: DC

## 2016-10-03 NOTE — Addendum Note (Signed)
Addended by: Philemon Kingdom on: 10/03/2016 08:51 AM   Modules accepted: Orders

## 2016-10-03 NOTE — Addendum Note (Signed)
Addended by: Caprice Beaver T on: 10/03/2016 08:56 AM   Modules accepted: Orders

## 2016-10-03 NOTE — Patient Instructions (Addendum)
Please continue: - Metformin XR 500 mg 2-3x a day, with a meal or a snack  - Novolog before dinner:   10 units before a regular meal  12 units before a larger meal of if you have dessert  Please increase: - Lantus to 25 units at bedtime  Decrease:   - Glipizide 5 mg once a day, before b'fast   Please check some sugars at bedtime.  Please come back for a follow-up appointment in 3 months.

## 2016-10-03 NOTE — Progress Notes (Signed)
Patient ID: Brent Wallace, male   DOB: 02-07-1961, 56 y.o.   MRN: [id number]  HPI: Brent Wallace is a 56 y.o.-year-old male, returning for f/u for DM2, dx 2009, insulin-dependent, uncontrolled, with complications (ED). Last visit 7 mo ago.   He saw nutrition since last visit >> this greatly helped!  He is on B12 inj and vitamin D po supplement.  Last hemoglobin A1c was: Lab Results  Component Value Date   HGBA1C 9.9 02/15/2016   HGBA1C 9.7 10/31/2015   HGBA1C 9.3 06/20/2015  Steroid shot in 05/2015.  Pt is on a regimen of: - Metformin XR 500 mg 2-3x a day, with a meal or a snack - Glipizide 5 mg 2x a day, before meals - Lantus  >> he decreased it to Lantus 20 units daily due to weight gain  - Novolog - >> takes this only with dinner  10 units before a regular meal  12 units before a larger meal of if you have dessert Invokana 100 mg daily was stopped as it was considered the culprit for pancreatitis - 02/2015 (?)  Had diarrhea with regular Metformin and 1000 mg Metformin ER.  Pt checks his sugars 2x a day they are: - am: 90, 102-145, 160 >> 139-237 >> 135-153 >> 119-175, 190, 223 (forgot meds last night) - 2h after b'fast: n/c >> 175 >> n/c - before lunch: 121-148, 155, 229 >> 145-204, 301 >> 173-253, 275, 543 >> n/c - 2h after lunch: 153 >> 75, 107 >> n/c >> 90, 221, 349 >> n/c >> 158 - before dinner: n/c >> 123-178, 291 >> 79, 121, 200 >> n/c >> 132, 143 - 2h after dinner:89-184 >> 113-243 >> n/c >> 179 >> n/c >> 226 >> n/c - bedtime: 105-165 >> n/c No lows. Lowest sugar was 135 >> ; ? he has hypoglycemia awareness. Highest sugar was  543  - No CKD, last BUN/creatinine:  Lab Results  Component Value Date   BUN 13 12/28/2015   CREATININE 1.12 12/28/2015  On Lisinopril 10. ACR improved at last check: Lab Results  Component Value Date   MICRALBCREAT 6.1 01/12/2015   MICRALBCREAT 51.2 (H) 09/28/2014   MICRALBCREAT 31.4 (H) 04/06/2013   MICRALBCREAT 25.1 07/09/2012    MICRALBCREAT 2.5 02/20/2012   MICRALBCREAT 9.0 09/03/2011   MICRALBCREAT 1.7 02/20/2011   MICRALBCREAT 2.7 02/13/2010   MICRALBCREAT 8.6 02/21/2009   MICRALBCREAT 5.7 02/12/2008   - last set of lipids: Lab Results  Component Value Date   CHOL 149 09/28/2014   HDL 50.30 09/28/2014   LDLCALC 83 09/28/2014   TRIG 78.0 09/28/2014   CHOLHDL 3 09/28/2014  Not on Statin. - last dilated eye exam was on 06/27/2014 (Dr Gevena Cotton [facility]). No DR. Has optic neuritis in right eye. He tells me he is due for another appointment, which he will schedule. - No numbness and tingling in his feet.  He also has a history of OSA and is on CPAP.  I reviewed pt's medications, allergies, PMH, social hx, family hx, and changes were documented in the history of present illness. Otherwise, unchanged from my initial visit note.  ROS: Constitutional: + weight gain and loss, no fatigue, no subjective hyperthermia, no subjective hypothermia Eyes: no blurry vision, no xerophthalmia ENT: no sore throat, no nodules palpated in throat, no dysphagia, no odynophagia, no hoarseness Cardiovascular: no CP/no SOB/no palpitations/no leg swelling Respiratory: no cough/no SOB Gastrointestinal: no N/no V/no D/no C Musculoskeletal: no muscle aches/+ joint  aches Skin: no rashes Neurological: no tremors/no numbness/no tingling/no dizziness   PE: BP (!) 142/100 (BP Location: Left Arm, Patient Position: Sitting)   Pulse 82   Ht 5\' 10"  (1.778 m)   Wt (!) 378 lb (171.5 kg)   SpO2 94%   BMI 54.24 kg/m  Body mass index is 54.24 kg/m. Wt Readings from Last 3 Encounters:  10/03/16 (!) 378 lb (171.5 kg)  02/15/16 (!) 373 lb (169.2 kg)  12/28/15 (!) 371 lb 6.4 oz (168.5 kg)   Constitutional: obese, in NAD Eyes: PERRLA, EOMI, no exophthalmos ENT: moist mucous membranes, no thyromegaly, no cervical lymphadenopathy Cardiovascular: RRR, No MRG Respiratory: CTA B Gastrointestinal: abdomen soft, NT, ND,  BS+ Musculoskeletal: no deformities, strength intact in all 4 Skin: moist, warm, no rashes Neurological: no tremor with outstretched hands, DTR normal in all 4  ASSESSMENT: 1. DM2, insulin-dependent, uncontrolled, with complications - Erectile dysfunction  PLAN:  1. 1. Patient with long-standing diabetes, on oral diabetic regimen plus basal-bolus insulin regimen, with improved control since last visit. He returns after a long absence, but he improved his diet since last visit after seeing the nutritionist. He now includes more vegetables, cuts out fried foods (uses an air [place]), and eats less carbs. He initially gained weight, but now losing it back. He still is not checking sugars later in the day, but the ones that he checked are better. Sugars in the morning are still high, since he cut down the insulin to help with weight loss. I explained that the Lantus dose is not quite enough, and we increased his to 25 units for now. I am hoping that, if he continues with his diet, we may be able to decrease the insulin and possibly even stop. As of now, he is only taking his NovoLog before dinner, which we will continue for now. - We cannot use Invokana as it was believed that this caused his pancreatitis episode in 02/2015. We also cannot use DPP4 inh and GLP1 R agonists b/c h/o pancreatitis. - I suggested to:  Patient Instructions  Please continue: - Metformin XR 500 mg 2-3x a day, with a meal or a snack  - Novolog before dinner:   10 units before a regular meal  12 units before a larger meal of if you have dessert  Please increase: - Lantus to 25 units at bedtime  Decrease:   - Glipizide 5 mg once a day, before b'fast   Please check some sugars at bedtime.  Please come back for a follow-up appointment in 3 months.  - needs a new eye exam >> advised to schedule  - he still needs B hip replacement >> will need to lose weight first - HbA1c today >> better: 7.7% << down from 9.9%  - Return  in about 3 months (around 01/02/2017).  Philemon Kingdom, MD PhD [facility] Endocrinology

## 2016-10-07 ENCOUNTER — Other Ambulatory Visit: Payer: Self-pay | Admitting: Internal Medicine

## 2016-11-10 ENCOUNTER — Other Ambulatory Visit: Payer: Self-pay | Admitting: Internal Medicine

## 2016-12-13 ENCOUNTER — Other Ambulatory Visit: Payer: Self-pay | Admitting: Internal Medicine

## 2016-12-16 ENCOUNTER — Other Ambulatory Visit: Payer: Self-pay

## 2016-12-16 MED ORDER — BASAGLAR KWIKPEN 100 UNIT/ML ~~LOC~~ SOPN: PEN_INJECTOR | SUBCUTANEOUS | 0 refills | Status: DC

## 2016-12-17 ENCOUNTER — Other Ambulatory Visit: Payer: Self-pay | Admitting: Internal Medicine

## 2016-12-18 ENCOUNTER — Other Ambulatory Visit: Payer: Self-pay

## 2016-12-18 MED ORDER — BASAGLAR KWIKPEN 100 UNIT/ML ~~LOC~~ SOPN: PEN_INJECTOR | SUBCUTANEOUS | 0 refills | Status: DC

## 2017-01-20 ENCOUNTER — Ambulatory Visit (INDEPENDENT_AMBULATORY_CARE_PROVIDER_SITE_OTHER): Payer: Managed Care, Other (non HMO) | Admitting: Internal Medicine

## 2017-01-20 ENCOUNTER — Other Ambulatory Visit: Payer: Self-pay | Admitting: Internal Medicine

## 2017-01-20 ENCOUNTER — Encounter: Payer: Self-pay | Admitting: Internal Medicine

## 2017-01-20 VITALS — BP 142/84 | HR 97 | Ht 70.0 in | Wt 379.0 lb

## 2017-01-20 DIAGNOSIS — N521 Erectile dysfunction due to diseases classified elsewhere: Secondary | ICD-10-CM

## 2017-01-20 DIAGNOSIS — E1159 Type 2 diabetes mellitus with other circulatory complications: Secondary | ICD-10-CM | POA: Diagnosis not present

## 2017-01-20 LAB — POCT GLYCOSYLATED HEMOGLOBIN (HGB A1C): Hemoglobin A1C: 8.5

## 2017-01-20 MED ORDER — FREESTYLE LIBRE SENSOR SYSTEM MISC: 1.0000 | 11 refills | Status: DC

## 2017-01-20 MED ORDER — FREESTYLE LIBRE READER DEVI: 1.0000 | Freq: Three times a day (TID) | 1 refills | Status: DC

## 2017-01-20 NOTE — Patient Instructions (Signed)
Please continue: - Metformin XR 500 mg 2-3x a day, with a meal or a snack  - Novolog before dinner:   10 units before a regular meal  12 units before a larger meal of if you have dessert  - Glipizide 5 mg 2x a day before meals  Try to increase Lantus to 28 units.  Read the following book: Dr. Alyssa Grove - Program for Reversing Diabetes  Please return in 3 months with your sugar log.

## 2017-01-20 NOTE — Progress Notes (Signed)
Patient ID: Brent Wallace, male   DOB: Aug 15, 1960, 56 y.o.   MRN: [id number]  HPI: Brent Wallace is a 56 y.o.-year-old male, returning for f/u for DM2, dx 2009, insulin-dependent, uncontrolled, with complications (ED). Last visit 3.5 mo ago. He is here with his wife who offers part of the history, especially related to his diet medication,, and his blood sugars.  He is on a diet [facility]). Did not lose weight.  Last hemoglobin A1c was: Lab Results  Component Value Date   HGBA1C 7.7 10/03/2016   HGBA1C 9.9 02/15/2016   HGBA1C 9.7 10/31/2015  Steroid shot in 05/2015.  Pt is on a regimen of: - Metformin XR 500 mg 2-3x a day, with a meal or a snack  - Novolog before dinner:   10 units before a regular meal  12 units before a larger meal of if you have dessert - Lantus 25 units at bedtime  - Glipizide 5 mg 2x a day Invokana 100 mg daily was stopped as it was considered the culprit for pancreatitis - 02/2015 (?)  Had diarrhea with regular Metformin and 1000 mg Metformin ER.  Pt checks his sugars 1x a day: - am: 90, 102-145, 160 >> 139-237 >> 135-153 >> 119-175, 190, 223 >> 120-185, 208 - 2h after b'fast: n/c >> 175 >> n/c >> 207 - before lunch: 121-148, 155, 229 >> 145-204, 301 >> 173-253, 275, 543 >> n/c - 2h after lunch: 153 >> 75, 107 >> n/c >> 90, 221, 349 >> n/c >> 158 >> n/c - before dinner: n/c >> 123-178, 291 >> 79, 121, 200 >> n/c >> 132, 143 >> n/c - 2h after dinner:89-184 >> 113-243 >> n/c >> 179 >> n/c >> 226 >> n/c - bedtime: 105-165 >> n/c No lows. Lowest sugar was 135 >> 120; ? he has hypoglycemia awareness. Highest sugar was  543 >> 208  - No CKD, last BUN/creatinine:  Lab Results  Component Value Date   BUN 13 12/28/2015   CREATININE 1.12 12/28/2015  On Lisinopril. ACR improved: Lab Results  Component Value Date   MICRALBCREAT 6.1 01/12/2015   MICRALBCREAT 51.2 (H) 09/28/2014   MICRALBCREAT 31.4 (H) 04/06/2013   MICRALBCREAT 25.1 07/09/2012   MICRALBCREAT  2.5 02/20/2012   MICRALBCREAT 9.0 09/03/2011   MICRALBCREAT 1.7 02/20/2011   MICRALBCREAT 2.7 02/13/2010   MICRALBCREAT 8.6 02/21/2009   MICRALBCREAT 5.7 02/12/2008   - last set of lipids: Lab Results  Component Value Date   CHOL 149 09/28/2014   HDL 50.30 09/28/2014   LDLCALC 83 09/28/2014   TRIG 78.0 09/28/2014   CHOLHDL 3 09/28/2014  Not on a statin. - Eye exam: 06/2014 (Dr Gevena Cotton [facility]). No DR. Has optic neuritis in right eye. - No tingling/numbness in feet  He also has a history of OSA and is on CPAP.  ROS: Constitutional: + weight gain/+ weight loss, no fatigue, no subjective hyperthermia, no subjective hypothermia Eyes: no blurry vision, no xerophthalmia ENT: no sore throat, no nodules palpated in throat, no dysphagia, no odynophagia, no hoarseness Cardiovascular: no CP/no SOB/no palpitations/no leg swelling Respiratory: no cough/no SOB/no wheezing Gastrointestinal: no N/no V/no D/no C/no acid reflux Musculoskeletal: no muscle aches/no joint aches Skin: no rashes, no hair loss Neurological: no tremors/no numbness/no tingling/no dizziness  I reviewed pt's medications, allergies, PMH, social hx, family hx, and changes were documented in the history of present illness. Otherwise, unchanged from my initial visit note.  PE: BP (!) 142/84  Pulse 97   Ht 5\' 10"  (1.778 m)   Wt (!) 379 lb (171.9 kg)   SpO2 97%   BMI 54.38 kg/m  Body mass index is 54.38 kg/m. Wt Readings from Last 3 Encounters:  01/20/17 (!) 379 lb (171.9 kg)  10/03/16 (!) 378 lb (171.5 kg)  02/15/16 (!) 373 lb (169.2 kg)   Constitutional: obese, in NAD Eyes: PERRLA, EOMI, no exophthalmos ENT: moist mucous membranes, no thyromegaly, no cervical lymphadenopathy Cardiovascular: tachycardia, RR, No MRG Respiratory: CTA B Gastrointestinal: abdomen soft, NT, ND, BS+ Musculoskeletal: no deformities, strength intact in all 4 Skin: moist, warm, no rashes Neurological: no tremor with  outstretched hands, DTR normal in all 4  ASSESSMENT: 1. DM2, insulin-dependent, uncontrolled, with complications - Erectile dysfunction  2. Obesity class 3 BMI Classification:  < 18.5 underweight   18.5-24.9 normal weight   25.0-29.9 overweight   30.0-34.9 class I obesity   35.0-39.9 class II obesity   ? 40.0 class III obesity    PLAN:  1.   Patient with long-standing diabetes, uncontrolled, on oral antidiabetic regimen and insulin, with worsening sugars since last visit. Unfortunately, he only checks sugars in the morning, which are greatly variable, so I cannot discern trends. Since sugars in a.m. are still above target, will increase his Lantus at this time. - We cannot use Invokana as it was believed that this caused his pancreatitis episode in 02/2015. We also cannot use DPP4 inh and GLP1 R agonists b/c h/o pancreatitis. - I suggested to:  Patient Instructions  Please continue: - Metformin XR 500 mg 2-3x a day, with a meal or a snack  - Novolog before dinner:   10 units before a regular meal  12 units before a larger meal of if you have dessert  - Glipizide 5 mg 2x a day before meals  Try to increase Lantus to 28 units.  Read the following book: Dr. Alyssa Grove - Program for Reversing Diabetes  Please return in 3 months with your sugar log.   - today, HbA1c is 8.5% (higher) - continue checking sugars at different times of the day - check 3x a day, rotating checks  - but I also recommended the FreeStyle CGM, and discussed how this works. He and his wife are excited about this and I sent a prescription to his pharmacy. - advised for yearly eye exams >> he needs one - Return to clinic in 3 mo with sugar log   2. Obesity class 3 - I discussed at length today with the patient and his wife about the benefits of a plant-based diet, which would really improve his diabetes and his weight. He absolutely needs to improve his weight fast so he can have hip replacement  surgery. We discussed that if he starts the diet, he will most likely be able to reduce the insulin doses.  - Given references   for the above diet   Philemon Kingdom, MD PhD [facility] Endocrinology

## 2017-01-20 NOTE — Progress Notes (Signed)
Patient ID: Brent Wallace, male   DOB: November 26, 1960, 56 y.o.   MRN: [id number]  HPI: Brent Wallace is a 56 y.o.-year-old male, returning for f/u for DM2, dx 2009, insulin-dependent, uncontrolled, with complications (ED). Last visit 7 mo ago.   He saw nutrition since last visit >> this greatly helped!  He is on B12 inj and vitamin D po supplement.  Last hemoglobin A1c was: Lab Results  Component Value Date   HGBA1C 7.7 10/03/2016   HGBA1C 9.9 02/15/2016   HGBA1C 9.7 10/31/2015  Steroid shot in 05/2015.  Pt is on a regimen of: - Metformin XR 500 mg 2-3x a day, with a meal or a snack - Glipizide 5 mg 2x a day, before meals - Lantus  >> he decreased it to Lantus 20 units daily due to weight gain  - Novolog - >> takes this only with dinner  10 units before a regular meal  12 units before a larger meal of if you have dessert Invokana 100 mg daily was stopped as it was considered the culprit for pancreatitis - 02/2015 (?)  Had diarrhea with regular Metformin and 1000 mg Metformin ER.  Pt checks his sugars 2x a day they are: - am: 90, 102-145, 160 >> 139-237 >> 135-153 >> 119-175, 190, 223 (forgot meds last night) - 2h after b'fast: n/c >> 175 >> n/c - before lunch: 121-148, 155, 229 >> 145-204, 301 >> 173-253, 275, 543 >> n/c - 2h after lunch: 153 >> 75, 107 >> n/c >> 90, 221, 349 >> n/c >> 158 - before dinner: n/c >> 123-178, 291 >> 79, 121, 200 >> n/c >> 132, 143 - 2h after dinner:89-184 >> 113-243 >> n/c >> 179 >> n/c >> 226 >> n/c - bedtime: 105-165 >> n/c No lows. Lowest sugar was 135 >> ; ? he has hypoglycemia awareness. Highest sugar was  543  - No CKD, last BUN/creatinine:  Lab Results  Component Value Date   BUN 13 12/28/2015   CREATININE 1.12 12/28/2015  On Lisinopril 10. ACR improved at last check: Lab Results  Component Value Date   MICRALBCREAT 6.1 01/12/2015   MICRALBCREAT 51.2 (H) 09/28/2014   MICRALBCREAT 31.4 (H) 04/06/2013   MICRALBCREAT 25.1 07/09/2012    MICRALBCREAT 2.5 02/20/2012   MICRALBCREAT 9.0 09/03/2011   MICRALBCREAT 1.7 02/20/2011   MICRALBCREAT 2.7 02/13/2010   MICRALBCREAT 8.6 02/21/2009   MICRALBCREAT 5.7 02/12/2008   - last set of lipids: Lab Results  Component Value Date   CHOL 149 09/28/2014   HDL 50.30 09/28/2014   LDLCALC 83 09/28/2014   TRIG 78.0 09/28/2014   CHOLHDL 3 09/28/2014  Not on Statin. - last dilated eye exam was on 06/27/2014 (Dr Gevena Cotton [facility]). No DR. Has optic neuritis in right eye. He tells me he is due for another appointment, which he will schedule. - No numbness and tingling in his feet.  He also has a history of OSA and is on CPAP.  I reviewed pt's medications, allergies, PMH, social hx, family hx, and changes were documented in the history of present illness. Otherwise, unchanged from my initial visit note.  ROS: Constitutional: + weight gain and loss, no fatigue, no subjective hyperthermia, no subjective hypothermia Eyes: no blurry vision, no xerophthalmia ENT: no sore throat, no nodules palpated in throat, no dysphagia, no odynophagia, no hoarseness Cardiovascular: no CP/no SOB/no palpitations/no leg swelling Respiratory: no cough/no SOB Gastrointestinal: no N/no V/no D/no C Musculoskeletal: no muscle aches/+ joint  aches Skin: no rashes Neurological: no tremors/no numbness/no tingling/no dizziness   PE: BP (!) 142/84   Pulse 97   Ht 5\' 10"  (1.778 m)   Wt (!) 379 lb (171.9 kg)   SpO2 97%   BMI 54.38 kg/m  Body mass index is 54.38 kg/m. Wt Readings from Last 3 Encounters:  01/20/17 (!) 379 lb (171.9 kg)  10/03/16 (!) 378 lb (171.5 kg)  02/15/16 (!) 373 lb (169.2 kg)   Constitutional: obese, in NAD Eyes: PERRLA, EOMI, no exophthalmos ENT: moist mucous membranes, no thyromegaly, no cervical lymphadenopathy Cardiovascular: RRR, No MRG Respiratory: CTA B Gastrointestinal: abdomen soft, NT, ND, BS+ Musculoskeletal: no deformities, strength intact in all  4 Skin: moist, warm, no rashes Neurological: no tremor with outstretched hands, DTR normal in all 4  ASSESSMENT: 1. DM2, insulin-dependent, uncontrolled, with complications - Erectile dysfunction  PLAN:  1. 1. Patient with long-standing diabetes, on oral diabetic regimen plus basal-bolus insulin regimen, with improved control since last visit. He returns after a long absence, but he improved his diet since last visit after seeing the nutritionist. He now includes more vegetables, cuts out fried foods (uses an air [place]), and eats less carbs. He initially gained weight, but now losing it back. He still is not checking sugars later in the day, but the ones that he checked are better. Sugars in the morning are still high, since he cut down the insulin to help with weight loss. I explained that the Lantus dose is not quite enough, and we increased his to 25 units for now. I am hoping that, if he continues with his diet, we may be able to decrease the insulin and possibly even stop. As of now, he is only taking his NovoLog before dinner, which we will continue for now. - We cannot use Invokana as it was believed that this caused his pancreatitis episode in 02/2015. We also cannot use DPP4 inh and GLP1 R agonists b/c h/o pancreatitis. - I suggested to:  Patient Instructions  Please continue: - Metformin XR 500 mg 2-3x a day, with a meal or a snack  - Novolog before dinner:   10 units before a regular meal  12 units before a larger meal of if you have dessert  Please increase: - Lantus to 25 units at bedtime  Decrease:   - Glipizide 5 mg once a day, before b'fast   Please check some sugars at bedtime.  Please come back for a follow-up appointment in 3 months.  - today, HbA1c is 7%  - continue checking sugars at different times of the day - check 1x a day, rotating checks - advised for yearly eye exams >> he is UTD - Return to clinic in 3 mo with sugar log   Philemon Kingdom, MD  PhD [facility] Endocrinology

## 2017-01-27 ENCOUNTER — Telehealth: Payer: Self-pay | Admitting: Internal Medicine

## 2017-01-27 ENCOUNTER — Encounter: Payer: Self-pay | Admitting: Internal Medicine

## 2017-01-27 ENCOUNTER — Other Ambulatory Visit: Payer: Self-pay

## 2017-01-27 MED ORDER — GLIPIZIDE 5 MG PO TABS: ORAL_TABLET | ORAL | 1 refills | Status: DC

## 2017-01-27 NOTE — Telephone Encounter (Signed)
MEDICATION: glipizide 5mg   PHARMACY: [id number] - [place], [place] - [address] [place] [address]   IS THIS A 90 DAY SUPPLY : yes  IS PATIENT OUT OF MEDICTAION: yes  IF NOT; HOW MUCH IS LEFT:   LAST APPOINTMENT DATE: 08/13  NEXT APPOINTMENT DATE: 11/13  OTHER COMMENTS:    **Let patient know to contact pharmacy at the end of the day to make sure medication is ready. **  ** Please notify patient to allow 48-72 hours to process**  **Encourage patient to contact the pharmacy for refills or they can request refills through [facility]**

## 2017-01-27 NOTE — Telephone Encounter (Signed)
Submitted

## 2017-02-28 ENCOUNTER — Encounter: Payer: Self-pay | Admitting: Family Medicine

## 2017-03-06 ENCOUNTER — Other Ambulatory Visit: Payer: Self-pay | Admitting: Internal Medicine

## 2017-03-26 ENCOUNTER — Ambulatory Visit (INDEPENDENT_AMBULATORY_CARE_PROVIDER_SITE_OTHER): Payer: 59 | Admitting: Family Medicine

## 2017-03-26 ENCOUNTER — Encounter: Payer: Self-pay | Admitting: Family Medicine

## 2017-03-26 VITALS — BP 150/100 | HR 88 | Temp 98.6°F | Ht 70.0 in | Wt 381.0 lb

## 2017-03-26 DIAGNOSIS — I10 Essential (primary) hypertension: Secondary | ICD-10-CM | POA: Diagnosis not present

## 2017-03-26 DIAGNOSIS — E118 Type 2 diabetes mellitus with unspecified complications: Secondary | ICD-10-CM | POA: Diagnosis not present

## 2017-03-26 DIAGNOSIS — Z0000 Encounter for general adult medical examination without abnormal findings: Secondary | ICD-10-CM | POA: Diagnosis not present

## 2017-03-26 DIAGNOSIS — N529 Male erectile dysfunction, unspecified: Secondary | ICD-10-CM | POA: Diagnosis not present

## 2017-03-26 DIAGNOSIS — Z6841 Body Mass Index (BMI) 40.0 and over, adult: Secondary | ICD-10-CM | POA: Diagnosis not present

## 2017-03-26 DIAGNOSIS — E66813 Obesity, class 3: Secondary | ICD-10-CM

## 2017-03-26 DIAGNOSIS — Z23 Encounter for immunization: Secondary | ICD-10-CM

## 2017-03-26 LAB — BASIC METABOLIC PANEL
BUN: 19 mg/dL (ref 6–23)
CALCIUM: 9.7 mg/dL (ref 8.4–10.5)
CHLORIDE: 101 meq/L (ref 96–112)
CO2: 27 mEq/L (ref 19–32)
CREATININE: 0.9 mg/dL (ref 0.40–1.50)
GFR: 111.57 mL/min (ref >60.00)
Glucose, Bld: 175 mg/dL — ABNORMAL HIGH (ref 70–99)
Potassium: 4 mEq/L (ref 3.5–5.1)
Sodium: 140 mEq/L (ref 135–145)

## 2017-03-26 LAB — LIPID PANEL
CHOLESTEROL: 150 mg/dL (ref 0–200)
HDL: 54 mg/dL (ref >39.00)
LDL CALC: 82 mg/dL (ref 0–99)
NonHDL: 96.46
TRIGLYCERIDES: 71 mg/dL (ref 0.0–149.0)
Total CHOL/HDL Ratio: 3
VLDL: 14.2 mg/dL (ref 0.0–40.0)

## 2017-03-26 LAB — CBC WITH DIFFERENTIAL/PLATELET
BASOS PCT: 1.1 % (ref 0.0–3.0)
Basophils Absolute: 0.1 10*3/uL (ref 0.0–0.1)
EOS PCT: 1.5 % (ref 0.0–5.0)
Eosinophils Absolute: 0.1 10*3/uL (ref 0.0–0.7)
HCT: 48 % (ref 39.0–52.0)
Hemoglobin: 16.1 g/dL (ref 13.0–17.0)
LYMPHS ABS: 2.1 10*3/uL (ref 0.7–4.0)
Lymphocytes Relative: 34.2 % (ref 12.0–46.0)
MCHC: 33.5 g/dL (ref 30.0–36.0)
MCV: 88.7 fl (ref 78.0–100.0)
MONO ABS: 0.5 10*3/uL (ref 0.1–1.0)
MONOS PCT: 8 % (ref 3.0–12.0)
NEUTROS ABS: 3.4 10*3/uL (ref 1.4–7.7)
NEUTROS PCT: 55.2 % (ref 43.0–77.0)
PLATELETS: 219 10*3/uL (ref 150.0–400.0)
RBC: 5.41 Mil/uL (ref 4.22–5.81)
RDW: 14.9 % (ref 11.5–15.5)
WBC: 6.2 10*3/uL (ref 4.0–10.5)

## 2017-03-26 LAB — HEPATIC FUNCTION PANEL
ALK PHOS: 59 U/L (ref 39–117)
ALT: 15 U/L (ref 0–53)
AST: 15 U/L (ref 0–37)
Albumin: 4.3 g/dL (ref 3.5–5.2)
BILIRUBIN DIRECT: 0.2 mg/dL (ref 0.0–0.3)
BILIRUBIN TOTAL: 0.6 mg/dL (ref 0.2–1.2)
Total Protein: 7.4 g/dL (ref 6.0–8.3)

## 2017-03-26 LAB — PSA: PSA: 0.69 ng/mL (ref 0.10–4.00)

## 2017-03-26 LAB — TSH: TSH: 0.86 u[IU]/mL (ref 0.35–4.50)

## 2017-03-26 MED ORDER — SILDENAFIL CITRATE 100 MG PO TABS: ORAL_TABLET | ORAL | 10 refills | Status: DC

## 2017-03-26 MED ORDER — LISINOPRIL-HYDROCHLOROTHIAZIDE 10-12.5 MG PO TABS: 1.0000 | ORAL_TABLET | Freq: Every day | ORAL | 4 refills | Status: DC
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
## 2017-03-26 NOTE — Patient Instructions (Signed)
Labs today....... I will call if is anything abnormal  Continue current medications  I put in a consult request for you to see the folks at the bariatric center to discuss bariatric surgery

## 2017-03-28 ENCOUNTER — Other Ambulatory Visit: Payer: Self-pay | Admitting: Internal Medicine

## 2017-04-22 ENCOUNTER — Encounter: Payer: Self-pay | Admitting: Internal Medicine

## 2017-04-22 ENCOUNTER — Ambulatory Visit (INDEPENDENT_AMBULATORY_CARE_PROVIDER_SITE_OTHER): Payer: 59 | Admitting: Internal Medicine

## 2017-04-22 VITALS — BP 140/82 | HR 97 | Wt 386.0 lb

## 2017-04-22 DIAGNOSIS — Z6841 Body Mass Index (BMI) 40.0 and over, adult: Secondary | ICD-10-CM

## 2017-04-22 DIAGNOSIS — N521 Erectile dysfunction due to diseases classified elsewhere: Secondary | ICD-10-CM | POA: Diagnosis not present

## 2017-04-22 DIAGNOSIS — E1159 Type 2 diabetes mellitus with other circulatory complications: Secondary | ICD-10-CM

## 2017-04-22 LAB — HEMOGLOBIN A1C: HEMOGLOBIN A1C: 7.5

## 2017-04-22 MED ORDER — BASAGLAR KWIKPEN 100 UNIT/ML ~~LOC~~ SOPN: PEN_INJECTOR | SUBCUTANEOUS | 11 refills | Status: DC

## 2017-04-22 MED ORDER — INSULIN LISPRO 100 UNIT/ML (KWIKPEN): 14.0000 [IU] | PEN_INJECTOR | Freq: Three times a day (TID) | SUBCUTANEOUS | 11 refills | Status: DC

## 2017-04-22 NOTE — Progress Notes (Signed)
Patient ID: IHAN PAT, male   DOB: Nov 07, 1960, 56 y.o.   MRN: [id number]  HPI: Brent Wallace is a 56 y.o.-year-old male, returning for f/u for DM2, dx 2009, insulin-dependent, uncontrolled, with complications (ED). Last visit 3 mo ago.  He started FreeStyle Libre CGM after last visit. He started this >> loves it. Uses the Glimp app.  At last visit, we discussed about  aplant-based diet >> did not start >> gained 7 lbs in last 3 mo >> PCP discussed with him about GBP Sx.  Last hemoglobin A1c was: Lab Results  Component Value Date   HGBA1C 8.5 01/20/2017   HGBA1C 7.7 10/03/2016   HGBA1C 9.9 02/15/2016  Steroid shot in 05/2015.  Pt is on a regimen of: - Metformin XR 500 mg 2x a day, with a meal or a snack  - Novolog with brunch and dinner >> usually after these meals!  10 units before a regular meal  12 units before a larger meal of if you have dessert - Lantus 25 >> 28 units at bedtime  - Glipizide 5 mg 2x a day Invokana 100 mg daily was stopped as it was considered the culprit for pancreatitis - 02/2015 (?)  Had diarrhea with regular Metformin and 1000 mg Metformin ER.  Per review of the freestyle libre CGM traces, his sugars are improved (25-75 percentile): CGM parameters: - Average from CGM: 164+/-39.4, coefficient of variation 24% (19-25%)  Time in range:  - low (<70): 0% - normal range (70-180): 69% - high sugars (>180): 31%  - am: 119-175, 190, 223 >> 120-185, 208 >> 125-140 - 2h after b'fast: n/c >> 175 >> n/c >> 207 >> 170-220 - before lunch:  173-253, 275, 543 >> n/c >> late b'fast - see above - 2h after lunch:90, 221, 349 >> n/c >> 158 >> n/c >> late b'fast - see above - before dinner:79, 121, 200 >> n/c >> 132, 143 >> n/c >> 110-160 - 2h after dinner n/c >> 179 >> n/c >> 226 >> n/c >> 150-210 - bedtime: 105-165 >> n/c Lowest sugar was 135 >> 120 >> 75 x1, 88 x1; he has hypoglycemia awareness - ? level. Highest sugar was  543 >> 208 >> 240  - No CKD, last  BUN/creatinine:  Lab Results  Component Value Date   BUN 19 03/26/2017   CREATININE 0.90 03/26/2017  On Lisinopril. ACR improved: Lab Results  Component Value Date   MICRALBCREAT 6.1 01/12/2015   MICRALBCREAT 51.2 (H) 09/28/2014   MICRALBCREAT 31.4 (H) 04/06/2013   MICRALBCREAT 25.1 07/09/2012   MICRALBCREAT 2.5 02/20/2012   MICRALBCREAT 9.0 09/03/2011   MICRALBCREAT 1.7 02/20/2011   MICRALBCREAT 2.7 02/13/2010   MICRALBCREAT 8.6 02/21/2009   MICRALBCREAT 5.7 02/12/2008   - last set of lipids: Lab Results  Component Value Date   CHOL 150 03/26/2017   HDL 54.00 03/26/2017   LDLCALC 82 03/26/2017   TRIG 71.0 03/26/2017   CHOLHDL 3 03/26/2017  Not on a statin. - Eye exam: 06/2014 >> No DR (Dr Gevena Cotton - [facility] Has optic neuritis in R eye. - No tingling/numbness in feet  He also has a history of OSA and is on CPAP.  ROS: Constitutional: no weight gain/no weight loss, no fatigue, no subjective hyperthermia, no subjective hypothermia Eyes: no blurry vision, no xerophthalmia ENT: no sore throat, no nodules palpated in throat, no dysphagia, no odynophagia, no hoarseness Cardiovascular: no CP/no SOB/no palpitations/no leg swelling Respiratory: no cough/no SOB/no wheezing Gastrointestinal:  no N/no V/no D/no C/no acid reflux Musculoskeletal: no muscle aches/no joint aches Skin: no rashes, no hair loss Neurological: no tremors/no numbness/no tingling/no dizziness  I reviewed pt's medications, allergies, PMH, social hx, family hx, and changes were documented in the history of present illness. Otherwise, unchanged from my initial visit note.  PE: BP 140/82 (BP Location: Left Arm, Patient Position: Sitting)   Pulse 97   Wt (!) 386 lb (175.1 kg)   SpO2 93%   BMI 55.39 kg/m  Body mass index is 55.39 kg/m. Wt Readings from Last 3 Encounters:  04/22/17 (!) 386 lb (175.1 kg)  03/26/17 (!) 381 lb (172.8 kg)  01/20/17 (!) 379 lb (171.9 kg)   Constitutional:  overweight, in NAD Eyes: PERRLA, EOMI, no exophthalmos ENT: moist mucous membranes, no thyromegaly, no cervical lymphadenopathy Cardiovascular: tachycardia, RR, No MRG Respiratory: CTA B Gastrointestinal: abdomen soft, NT, ND, BS+ Musculoskeletal: no deformities, strength intact in all 4 Skin: moist, warm, no rashes Neurological: no tremor with outstretched hands, DTR normal in all 4  ASSESSMENT: 1. DM2, insulin-dependent, uncontrolled, with complications - Erectile dysfunction  2. Obesity class 3 BMI Classification:  < 18.5 underweight   18.5-24.9 normal weight   25.0-29.9 overweight   30.0-34.9 class I obesity   35.0-39.9 class II obesity   ? 40.0 class III obesity   PLAN:  1.   Patient with long-standing uncontrolled diabetes, with better control at this visit, after he started the freestyle libre CGM.  His diet did not improved since last visit and he actually gained weight. - We reviewed his CGM traces together.  His sugars are at or close to goal before meals, but they increase with his main meals.  Upon questioning, patient is taking his insulin usually after he eats, so we discussed about moving the doses before he eats and will also increase them slightly.  No need to change the rest of his regimen for now. - We cannot use Invokana as it was believed that this caused his pancreatitis episode in 02/2015. We also cannot use DPP4 inh and GLP1 R agonists also because of his history of pancreatitis - I suggested to:  Patient Instructions  Please continue: - Metformin XR 500 mg 2-3x a day, with a meal or a snack\ - Glipizide 5 mg 2x a day before meals - Lantus 28 units at bedtime  Please increase: - Novolog before dinner:   14 units before a regular meal  16 units before a larger meal of if you have dessert  Please return in 3 months with your sugar log.   - today, HbA1c is 7.5% (better) - continue checking sugars at different times of the day - check 1x a day,  rotating checks - advised for yearly eye exams >> he is not UTD - Return to clinic in 3 mo with sugar log    2. Obesity class 3 - he gained 7 lbs since last visit - at last visit, we discussed at length with him and his wife about the need to lose weight so he can have hip replacement surgery and improve his health in general >> recommended a plant-based diet >> did not try this -he did get the recommended book and he is planning to read it.  He is telling me that he is not excited about the idea of gastric bypass surgery.  We discussed that if he absolutely will need to lose weight regardless of the method used.  Philemon Kingdom, MD PhD [facility] Endocrinology

## 2017-04-22 NOTE — Patient Instructions (Addendum)
Please continue: - Metformin XR 500 mg 2x a day, with a meal or a snack. - Glipizide 5 mg 2x a day before meals - Lantus 28 units at bedtime  Please increase: - Novolog before the 2 main meals:  14 units before a regular meal  16 units before a larger meal of if you have dessert  Please return in 3 months with your sugar log.

## 2017-05-09 ENCOUNTER — Other Ambulatory Visit: Payer: Self-pay | Admitting: Internal Medicine

## 2017-05-10 ENCOUNTER — Other Ambulatory Visit: Payer: Self-pay | Admitting: Endocrinology

## 2017-05-14 ENCOUNTER — Other Ambulatory Visit: Payer: Self-pay

## 2017-05-14 MED ORDER — INSULIN PEN NEEDLE 32G X 4 MM MISC: 5 refills | Status: DC

## 2017-06-07 ENCOUNTER — Other Ambulatory Visit: Payer: Self-pay | Admitting: Internal Medicine

## 2017-06-16 ENCOUNTER — Other Ambulatory Visit: Payer: Self-pay | Admitting: Internal Medicine

## 2017-06-20 ENCOUNTER — Telehealth: Payer: Self-pay | Admitting: Internal Medicine

## 2017-06-20 NOTE — Telephone Encounter (Signed)
I see! We can stopped the glipizide before dinner If he still drops his sugars during the night in 2-3 days, we also need to decrease the Lantus to 25 units.  Please let us know how this goes.

## 2017-06-20 NOTE — Telephone Encounter (Signed)
She states that since he has already decreased his meal time insulin by the 2 units since he is taking 12 should he go down more or lessen any of his other medication to prevent the lows in the middle of the night from happening. Please advise

## 2017-06-20 NOTE — Telephone Encounter (Signed)
Pts wife is aware 

## 2017-06-20 NOTE — Telephone Encounter (Signed)
Per last visit's instructions:   Please continue: - Metformin XR 500 mg 2-3x a day, with a meal or a snack\ - Glipizide 5 mg 2x a day before meals - Lantus 28 units at bedtime  Please increase: - Novolog before dinner:              14 units before a regular meal             16 units before a larger meal of if you have dessert  So he should take at least 14 units before lunch.  If he is dropping too much after dinner, can reduce the Novolog before this meal by 2 units.

## 2017-06-20 NOTE — Telephone Encounter (Signed)
Pt's wife stated that his sugars after lunch are 180-200. His sugars start dropping after 5 and then before bed she is not sure what his sugars are. 12 units is being taken with each meal. Takes lantus at night as well but she is unsure the dosage he has been taking. Please advise.

## 2017-06-20 NOTE — Telephone Encounter (Signed)
Pts wife states he has been having low blood sugar between 3am and 4am  60-70  pts wife wants to know if  he should adjust his meds   Please advise

## 2017-07-24 ENCOUNTER — Encounter: Payer: Self-pay | Admitting: Internal Medicine

## 2017-07-24 ENCOUNTER — Ambulatory Visit (INDEPENDENT_AMBULATORY_CARE_PROVIDER_SITE_OTHER): Payer: 59 | Admitting: Internal Medicine

## 2017-07-24 VITALS — BP 168/92 | HR 90 | Ht 70.0 in | Wt 389.0 lb

## 2017-07-24 DIAGNOSIS — E1159 Type 2 diabetes mellitus with other circulatory complications: Secondary | ICD-10-CM

## 2017-07-24 DIAGNOSIS — Z6841 Body Mass Index (BMI) 40.0 and over, adult: Secondary | ICD-10-CM

## 2017-07-24 DIAGNOSIS — I10 Essential (primary) hypertension: Secondary | ICD-10-CM | POA: Diagnosis not present

## 2017-07-24 DIAGNOSIS — N521 Erectile dysfunction due to diseases classified elsewhere: Secondary | ICD-10-CM

## 2017-07-24 LAB — POCT GLYCOSYLATED HEMOGLOBIN (HGB A1C): Hemoglobin A1C: 8.3

## 2017-07-24 NOTE — Patient Instructions (Addendum)
Please continue: - Metformin XR 500 mg 2x a day, with meals - Lantus 25 units at bedtime  Stop: - Glipizide  Move: - Novolog to 15 min before meals:   12 units before a regular meal  14 units before a regular meal  16 units before a larger meal of if you have dessert  Please return in 3 months with your sugar log.

## 2017-07-24 NOTE — Progress Notes (Signed)
Patient ID: Brent Wallace, male   DOB: [birth year], 57 y.o.   MRN: [id number]  HPI: Brent Wallace is a 57 y.o.-year-old male, returning for f/u for DM2, dx 2009, insulin-dependent, uncontrolled, with complications (ED). Last visit 3 mo ago. He is here with his wife who is very involved in his DM care and offers info about his meals, diabetes regimen, and sugar checks.  He continues on his freestyle libre CGM.  We downloaded the reports today and reviewed them along with patient and his wife.  At last visit, we discussed about the plant-based diet, but he did not do this so he gained another few pounds since last visit.  His PCP also discussed with him about gastric bypass surgery in the past.  He did not decide about this.  Last hemoglobin A1c was: Lab Results  Component Value Date   HGBA1C 8.3 07/24/2017   HGBA1C 7.5 04/22/2017   HGBA1C 8.5 01/20/2017  Steroid shot in 05/2015.  Pt is on a regimen of: - Metformin XR 500 mg 2x a day, with a meal or a snack - Glipizide 5 mg 2x a day before meals >> 5 mg in am - Lantus 28 >> 25 units at bedtime - Humalog before meals: 12 units 2x a day Invokana 100 mg daily was stopped as it was considered the culprit for pancreatitis - 02/2015 (?)  Had diarrhea with regular Metformin and 1000 mg Metformin ER.  CGM parameters: - Average from CGM: 164+/-39.4 181 >> 181 +/-44.2, coefficient of variation 24% >> 24.4% (19-25%)  Time in range:  - low (<70): 0% >> 0% >> 0% - normal range (70-180): 69% >> 50% - high sugars (>180): 31% >> 50%  CBGs 25-75 percentile: - am: 119-175, 190, 223 >> 120-185, 208 >> 125-140 >> 154-175 - 2h after b'fast: n/c >> 175 >> n/c >> 207 >> 170-220 >> 200-250 - before lunch:  173-253, 275, 543 >> n/c >> late b'fast  - 2h after lunch:90, 221, 349 >> n/c >> 158 >> n/c >> late b'fast - before dinner:79, 121, 200 >> n/c >> 132, 143 >> n/c >> 110-160 >> 100-175 - 2h after dinner n/c >> 179 >> n/c >> 226 >> n/c >> 150-210 >>  200-240 - bedtime: 105-165 >> n/c Lowest sugar was 75 x1, 88 x1 >> 69; he has hypoglycemia awareness - ? level Highest sugar was  240 >> 300s  -No CKD, last BUN/creatinine:  Lab Results  Component Value Date   BUN 19 03/26/2017   CREATININE 0.90 03/26/2017  On lisinopril.  ACR previously high, now improved Lab Results  Component Value Date   MICRALBCREAT 6.1 01/12/2015   MICRALBCREAT 51.2 (H) 09/28/2014   MICRALBCREAT 31.4 (H) 04/06/2013   MICRALBCREAT 25.1 07/09/2012   MICRALBCREAT 2.5 02/20/2012   MICRALBCREAT 9.0 09/03/2011   MICRALBCREAT 1.7 02/20/2011   MICRALBCREAT 2.7 02/13/2010   MICRALBCREAT 8.6 02/21/2009   MICRALBCREAT 5.7 02/12/2008   - last set of lipids: Lab Results  Component Value Date   CHOL 150 03/26/2017   HDL 54.00 03/26/2017   LDLCALC 82 03/26/2017   TRIG 71.0 03/26/2017   CHOLHDL 3 03/26/2017  Not on a statin. - Eye exam: 06/2014: No DR (Dr Gevena Cotton - [facility]). Has optic neuritis in R eye. - No tingling/numbness in feet  He also has a history of OSA -on CPAP  ROS: Constitutional: + weight gain/no weight loss, no fatigue, no subjective hyperthermia, no subjective hypothermia Eyes: no blurry  vision, no xerophthalmia ENT: no sore throat, no nodules palpated in throat, no dysphagia, no odynophagia, no hoarseness Cardiovascular: no CP/no SOB/no palpitations/no leg swelling Respiratory: no cough/no SOB/no wheezing Gastrointestinal: no N/no V/no D/no C/no acid reflux Musculoskeletal: no muscle aches/no joint aches Skin: no rashes, no hair loss Neurological: no tremors/no numbness/no tingling/no dizziness  I reviewed pt's medications, allergies, PMH, social hx, family hx, and changes were documented in the history of present illness. Otherwise, unchanged from my initial visit note.  PE: BP (!) 172/94 (BP Location: Left Arm, Patient Position: Sitting, Cuff Size: Large)   Pulse 90   Ht 5\' 10"  (1.778 m)   Wt (!) 389 lb (176.4 kg)    SpO2 96%   BMI 55.82 kg/m  Body mass index is 55.82 kg/m. Wt Readings from Last 3 Encounters:  07/24/17 (!) 389 lb (176.4 kg)  04/22/17 (!) 386 lb (175.1 kg)  03/26/17 (!) 381 lb (172.8 kg)   Constitutional: Obese, in NAD Eyes: PERRLA, EOMI, no exophthalmos ENT: moist mucous membranes, no thyromegaly, no cervical lymphadenopathy Cardiovascular: RRR, No MRG Respiratory: CTA B Gastrointestinal: abdomen soft, NT, ND, BS+ Musculoskeletal: no deformities, strength intact in all 4 Skin: moist, warm, no rashes Neurological: no tremor with outstretched hands, DTR normal in all 4  ASSESSMENT: 1. DM2, insulin-dependent, uncontrolled, with complications - Erectile dysfunction  2. Obesity class 3 BMI Classification:  < 18.5 underweight   18.5-24.9 normal weight   25.0-29.9 overweight   30.0-34.9 class I obesity   35.0-39.9 class II obesity   ? 40.0 class III obesity   3. HTN  PLAN:  1.   Patient with long-standing uncontrolled diabetes, with slightly worse control at this visit compared to before per review of his freestyle libre CGM traces.  He gained weight since last visit also. - Upon questioning, patient is taking the a.m. glipizide and the NovoLog after breakfast and also the evening NovoLog after dinner.  We discussed repeatedly in the past that these should be taken approximately 15 minutes before the meal, but he waits until he finishes the meal before taking them.  Therefore, he has high postprandial sugars followed by low blood sugars after injecting insulin.  I strongly advised him to move the insulin as before the meals and will also stop glipizide at this visit since he is complaining of lows midafternoon - We cannot use Invokana as it was believed that this caused his pancreatitis episode in 02/2015. We also cannot use DPP4 inh and GLP1 R agonists also because of his history of pancreatitis - I suggested to:  Patient Instructions  Please continue: - Metformin XR 500  mg 2x a day, with meals - Lantus 25 units at bedtime  Stop: - Glipizide  Move: - Novolog to 15 min before meals:   12 units before a regular meal  14 units before a regular meal  16 units before a larger meal of if you have dessert  Please return in 3 months with your sugar log.   - today, HbA1c is 8.3% (higher) - continue checking sugars at different times of the day - check 3x a day, rotating checks - advised for yearly eye exams >> he is not UTD - Return to clinic in 3 mo with sugar log   2. Obesity class 3 - Continues to gain weight - We discussed at length at previous visits about diet but he did not apply my recommendations.   - He also did not decide about the gastric bypass  surgery - They are interested in a referral to nutrition >> referral placed today  3. HTN - Blood pressure quite high today.  It was similar on repeat at the end of the visit.  He mentions that he did not take his blood pressure medicines today.  Advised to do so.  Philemon Kingdom, MD PhD [facility] Endocrinology

## 2017-08-29 ENCOUNTER — Other Ambulatory Visit: Payer: Self-pay | Admitting: Internal Medicine

## 2017-10-15 ENCOUNTER — Other Ambulatory Visit: Payer: Self-pay | Admitting: Internal Medicine

## 2017-10-16 ENCOUNTER — Other Ambulatory Visit: Payer: Self-pay | Admitting: Internal Medicine

## 2017-10-21 ENCOUNTER — Ambulatory Visit: Payer: 59 | Admitting: Internal Medicine

## 2017-11-14 ENCOUNTER — Encounter: Payer: 59 | Attending: Internal Medicine | Admitting: Dietician

## 2017-11-14 ENCOUNTER — Encounter: Payer: Self-pay | Admitting: Dietician

## 2017-11-14 DIAGNOSIS — E1159 Type 2 diabetes mellitus with other circulatory complications: Secondary | ICD-10-CM | POA: Diagnosis not present

## 2017-11-14 DIAGNOSIS — Z6841 Body Mass Index (BMI) 40.0 and over, adult: Secondary | ICD-10-CM | POA: Insufficient documentation

## 2017-11-14 DIAGNOSIS — Z713 Dietary counseling and surveillance: Secondary | ICD-10-CM | POA: Insufficient documentation

## 2017-11-14 DIAGNOSIS — N521 Erectile dysfunction due to diseases classified elsewhere: Secondary | ICD-10-CM | POA: Insufficient documentation

## 2017-11-14 NOTE — Patient Instructions (Addendum)
Aim to be more active daily- You tube Armchair exercises.  Aim for 30 minutes. Consider Physical Therapy Make an eye appointment Choose lean protein. Choose more non starchy vegetables. Continue to evaluate how certain meals effect your blood sugar by looking at the readings on the [place] before you eat and 2 hours after you start to eat. Consider ways to introduce more low fat Whole Foods Plant Based Meals. Continue to take your Humalog insulin 15 minutes prior to your meals.  Humalog:  12 units before a small meal, 14 units before a medium meal, 16 units before a large meal or when you have dessert.
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
## 2017-11-24 ENCOUNTER — Ambulatory Visit: Payer: 59 | Admitting: Internal Medicine

## 2017-11-24 ENCOUNTER — Encounter: Payer: Self-pay | Admitting: Internal Medicine

## 2017-11-24 VITALS — BP 142/90 | HR 86 | Ht 70.0 in | Wt 385.4 lb

## 2017-11-24 DIAGNOSIS — E1159 Type 2 diabetes mellitus with other circulatory complications: Secondary | ICD-10-CM

## 2017-11-24 DIAGNOSIS — Z6841 Body Mass Index (BMI) 40.0 and over, adult: Secondary | ICD-10-CM | POA: Diagnosis not present

## 2017-11-24 DIAGNOSIS — N521 Erectile dysfunction due to diseases classified elsewhere: Secondary | ICD-10-CM | POA: Diagnosis not present

## 2017-11-24 LAB — POCT GLYCOSYLATED HEMOGLOBIN (HGB A1C): Hemoglobin A1C: 9.1 % — AB (ref 4.0–5.6)

## 2017-11-24 MED ORDER — BASAGLAR KWIKPEN 100 UNIT/ML ~~LOC~~ SOPN: PEN_INJECTOR | SUBCUTANEOUS | 11 refills | Status: DC

## 2017-11-24 MED ORDER — METFORMIN HCL ER 500 MG PO TB24: 500.0000 mg | ORAL_TABLET | Freq: Two times a day (BID) | ORAL | 3 refills | Status: DC

## 2017-11-24 NOTE — Patient Instructions (Addendum)
Please continue: - Metformin XR 500 mg 2x a day, with meals  Please increase: - Lantus to 30 units at bedtime (may even go up to 34-36 units as needed)  Please use: - Humalog 15 min before meals:   12 units before a smaller meal  14 units before a regular meal  16 units before a larger meal of if you have dessert  Please return in 3 months with your sugar log.

## 2017-11-24 NOTE — Addendum Note (Signed)
Addended by: Drucilla Schmidt on: 11/24/2017 03:31 PM   Modules accepted: Orders
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
## 2017-11-30 ENCOUNTER — Other Ambulatory Visit: Payer: Self-pay | Admitting: Internal Medicine

## 2017-12-26 ENCOUNTER — Encounter: Payer: 59 | Attending: Internal Medicine | Admitting: Dietician

## 2017-12-26 ENCOUNTER — Encounter: Payer: Self-pay | Admitting: Dietician

## 2017-12-26 DIAGNOSIS — Z6841 Body Mass Index (BMI) 40.0 and over, adult: Secondary | ICD-10-CM | POA: Insufficient documentation

## 2017-12-26 DIAGNOSIS — Z713 Dietary counseling and surveillance: Secondary | ICD-10-CM | POA: Diagnosis not present

## 2017-12-26 DIAGNOSIS — E1159 Type 2 diabetes mellitus with other circulatory complications: Secondary | ICD-10-CM

## 2017-12-26 DIAGNOSIS — N521 Erectile dysfunction due to diseases classified elsewhere: Secondary | ICD-10-CM | POA: Diagnosis not present

## 2017-12-26 NOTE — Patient Instructions (Addendum)
Consider going to the [employer] and downloading the [employer].  This is yellow. (or red colored Elenor Legato app is used to connect others). Consider Nexcare Waterproof Band Aide or wear a band or place on the inside of the arm to avoid knocking it off.  Stay active. You tube arm chair exercises.  Great job on the changes that you have made! Great job on adding more vegetables!
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
## 2018-01-05 ENCOUNTER — Other Ambulatory Visit: Payer: Self-pay | Admitting: Internal Medicine

## 2018-01-06 ENCOUNTER — Telehealth: Payer: Self-pay | Admitting: Internal Medicine

## 2018-01-06 MED ORDER — FREESTYLE LIBRE 14 DAY SENSOR MISC: 1.0000 | 3 refills | Status: DC

## 2018-01-06 MED ORDER — FREESTYLE LIBRE 14 DAY READER DEVI: 1.0000 | Freq: Once | 0 refills | Status: AC

## 2018-01-06 NOTE — Telephone Encounter (Signed)
Sent!

## 2018-01-06 NOTE — Telephone Encounter (Signed)
[name] (wife) called for husband (patient) to request RX for [employer] day (instead of the 10 day) sent to [facility] on [address] in [place] (near [employer])

## 2018-02-13 ENCOUNTER — Encounter: Payer: 59 | Attending: Internal Medicine | Admitting: Dietician

## 2018-02-13 DIAGNOSIS — Z713 Dietary counseling and surveillance: Secondary | ICD-10-CM | POA: Diagnosis not present

## 2018-02-13 DIAGNOSIS — Z6841 Body Mass Index (BMI) 40.0 and over, adult: Secondary | ICD-10-CM | POA: Diagnosis not present

## 2018-02-13 DIAGNOSIS — E1159 Type 2 diabetes mellitus with other circulatory complications: Secondary | ICD-10-CM | POA: Diagnosis not present

## 2018-02-13 DIAGNOSIS — N521 Erectile dysfunction due to diseases classified elsewhere: Secondary | ICD-10-CM | POA: Insufficient documentation

## 2018-02-13 NOTE — Patient Instructions (Signed)
Consider going to the [employer] and downloading the [employer].  This is yellow. (or red colored Elenor Legato app is used to connect others). Consider Nexcare Waterproof Band Aide or wear a band or place on the inside of the arm to avoid knocking it off.  Stay active. You tube arm chair exercises or being more active on your bike/recumbant bike.  Continue to experiment with new foods and vegetables.  Consider listening to [employer] Podcast #11 (Pace President)  Take your Novolog before you eat your meals.  Avoid eating snacks when not hungry especially at night. Read the labels and keep to the portion size.  Eat 3 meals rather than 2 meals plus snacks.

## 2018-02-16 ENCOUNTER — Encounter: Payer: Self-pay | Admitting: Dietician
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
## 2018-03-07 ENCOUNTER — Other Ambulatory Visit: Payer: Self-pay | Admitting: Internal Medicine

## 2018-03-16 DIAGNOSIS — H538 Other visual disturbances: Secondary | ICD-10-CM | POA: Diagnosis not present

## 2018-03-16 DIAGNOSIS — H469 Unspecified optic neuritis: Secondary | ICD-10-CM | POA: Diagnosis not present

## 2018-03-16 DIAGNOSIS — E119 Type 2 diabetes mellitus without complications: Secondary | ICD-10-CM | POA: Diagnosis not present

## 2018-03-16 DIAGNOSIS — H2511 Age-related nuclear cataract, right eye: Secondary | ICD-10-CM | POA: Diagnosis not present

## 2018-03-17 ENCOUNTER — Encounter: Payer: Self-pay | Admitting: Internal Medicine

## 2018-03-17 ENCOUNTER — Ambulatory Visit (INDEPENDENT_AMBULATORY_CARE_PROVIDER_SITE_OTHER): Payer: 59 | Admitting: Internal Medicine

## 2018-03-17 VITALS — BP 148/90 | HR 82 | Ht 70.0 in | Wt 382.0 lb

## 2018-03-17 DIAGNOSIS — Z23 Encounter for immunization: Secondary | ICD-10-CM | POA: Diagnosis not present

## 2018-03-17 DIAGNOSIS — Z6841 Body Mass Index (BMI) 40.0 and over, adult: Secondary | ICD-10-CM | POA: Diagnosis not present

## 2018-03-17 DIAGNOSIS — E1159 Type 2 diabetes mellitus with other circulatory complications: Secondary | ICD-10-CM

## 2018-03-17 DIAGNOSIS — N521 Erectile dysfunction due to diseases classified elsewhere: Secondary | ICD-10-CM | POA: Diagnosis not present

## 2018-03-17 LAB — POCT GLYCOSYLATED HEMOGLOBIN (HGB A1C): HEMOGLOBIN A1C: 9.5 % — AB (ref 4.0–5.6)

## 2018-03-17 MED ORDER — INSULIN LISPRO 100 UNIT/ML (KWIKPEN): PEN_INJECTOR | SUBCUTANEOUS | 3 refills | Status: DC

## 2018-03-17 MED ORDER — BASAGLAR KWIKPEN 100 UNIT/ML ~~LOC~~ SOPN: PEN_INJECTOR | SUBCUTANEOUS | 11 refills | Status: DC
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
## 2018-03-17 NOTE — Patient Instructions (Addendum)
Please continue: - Metformin XR 500 mg 2x a day, with meals  Please increase: - Basaglar 36 units at bedtime - Humalog 15 min before meals:   16 units before a smaller meal  20 units before a regular meal  24 units before a larger meal of if you have dessert  Please increase the doses of Humalog as needed if the sugars do not improve.  TAKE THE HUMALOG 15 MIN BEFORE A MEAL.  Please return in 3 months with your sugar log.

## 2018-03-27 ENCOUNTER — Ambulatory Visit: Payer: 59 | Admitting: Dietician

## 2018-04-01 ENCOUNTER — Other Ambulatory Visit: Payer: Self-pay | Admitting: Family Medicine

## 2018-04-13 ENCOUNTER — Other Ambulatory Visit: Payer: Self-pay | Admitting: Internal Medicine

## 2018-04-20 ENCOUNTER — Other Ambulatory Visit: Payer: Self-pay | Admitting: Family Medicine

## 2018-04-24 ENCOUNTER — Ambulatory Visit: Payer: 59 | Admitting: Dietician

## 2018-05-01 DIAGNOSIS — G4733 Obstructive sleep apnea (adult) (pediatric): Secondary | ICD-10-CM | POA: Diagnosis not present

## 2018-05-08 ENCOUNTER — Other Ambulatory Visit: Payer: Self-pay | Admitting: Internal Medicine

## 2018-05-16 ENCOUNTER — Other Ambulatory Visit: Payer: Self-pay | Admitting: Internal Medicine

## 2018-06-09 ENCOUNTER — Other Ambulatory Visit: Payer: Self-pay | Admitting: Internal Medicine

## 2018-06-23 ENCOUNTER — Ambulatory Visit: Payer: 59 | Admitting: Internal Medicine

## 2018-07-02 ENCOUNTER — Other Ambulatory Visit: Payer: Self-pay | Admitting: Internal Medicine

## 2018-07-08 ENCOUNTER — Telehealth: Payer: Self-pay | Admitting: Internal Medicine

## 2018-07-08 NOTE — Telephone Encounter (Signed)
Patients wife called [facility] @ 5:15pm 07/07/2018   "caller stated her husbands insurance no longer covers one of his medications (humalog) preferred insulin by the pharmacy is Novalog"

## 2018-07-09 MED ORDER — INSULIN ASPART 100 UNIT/ML FLEXPEN: PEN_INJECTOR | SUBCUTANEOUS | 4 refills | Status: DC

## 2018-07-09 MED ORDER — BASAGLAR KWIKPEN 100 UNIT/ML ~~LOC~~ SOPN: PEN_INJECTOR | SUBCUTANEOUS | 11 refills | Status: DC

## 2018-07-09 NOTE — Telephone Encounter (Signed)
OK - same doses

## 2018-07-09 NOTE — Addendum Note (Signed)
Addended by: Cardell Peach I on: 07/09/2018 04:15 PM   Modules accepted: Orders

## 2018-07-09 NOTE — Telephone Encounter (Signed)
RX sent

## 2018-08-06 ENCOUNTER — Telehealth: Payer: Self-pay

## 2018-08-06 NOTE — Telephone Encounter (Signed)
Rx refill request came in for pt. Rx has been denied. Pt needs an appt.

## 2018-08-06 NOTE — Telephone Encounter (Signed)
Patient has appt with Luetta Nutting on 08/07/18

## 2018-08-07 ENCOUNTER — Ambulatory Visit: Payer: 59 | Admitting: Family Medicine

## 2018-08-07 ENCOUNTER — Encounter: Payer: Self-pay | Admitting: Family Medicine

## 2018-08-07 VITALS — BP 142/86 | HR 92 | Temp 98.1°F | Ht 74.0 in | Wt 391.0 lb

## 2018-08-07 DIAGNOSIS — G4733 Obstructive sleep apnea (adult) (pediatric): Secondary | ICD-10-CM | POA: Diagnosis not present

## 2018-08-07 DIAGNOSIS — G35 Multiple sclerosis: Secondary | ICD-10-CM

## 2018-08-07 DIAGNOSIS — N521 Erectile dysfunction due to diseases classified elsewhere: Secondary | ICD-10-CM | POA: Diagnosis not present

## 2018-08-07 DIAGNOSIS — I10 Essential (primary) hypertension: Secondary | ICD-10-CM | POA: Diagnosis not present

## 2018-08-07 DIAGNOSIS — E1159 Type 2 diabetes mellitus with other circulatory complications: Secondary | ICD-10-CM

## 2018-08-07 MED ORDER — LISINOPRIL-HYDROCHLOROTHIAZIDE 10-12.5 MG PO TABS: 1.0000 | ORAL_TABLET | Freq: Every day | ORAL | 2 refills | Status: DC

## 2018-08-07 NOTE — Patient Instructions (Signed)
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
## 2018-08-07 NOTE — Assessment & Plan Note (Signed)
-  Compliant with CPAP, continue nightly use.

## 2018-08-07 NOTE — Assessment & Plan Note (Signed)
-  History of optic neuritis, thought to be due to MS.  No issues for several years.

## 2018-08-07 NOTE — Assessment & Plan Note (Signed)
Uncontrolled but stable, following with endocrinology.  -Labs updated today.  -Discussed that he should be on a statin given DM and co-morbidities of obesity and HTN.

## 2018-08-07 NOTE — Assessment & Plan Note (Signed)
-  BP is mildly elevated -Recommend low salt diet with regular exercise.  -Restart medication -Labs updated today.

## 2018-08-08 LAB — COMPREHENSIVE METABOLIC PANEL
AG Ratio: 1.4 (calc) (ref 1.0–2.5)
ALBUMIN MSPROF: 4.2 g/dL (ref 3.6–5.1)
ALT: 18 U/L (ref 9–46)
AST: 17 U/L (ref 10–35)
Alkaline phosphatase (APISO): 66 U/L (ref 35–144)
BUN: 18 mg/dL (ref 7–25)
CALCIUM: 9.9 mg/dL (ref 8.6–10.3)
CHLORIDE: 99 mmol/L (ref 98–110)
CO2: 26 mmol/L (ref 20–32)
CREATININE: 1.2 mg/dL (ref 0.70–1.33)
GLOBULIN: 3 g/dL (ref 1.9–3.7)
GLUCOSE: 318 mg/dL — AB (ref 65–99)
Potassium: 4.6 mmol/L (ref 3.5–5.3)
Sodium: 137 mmol/L (ref 135–146)
TOTAL PROTEIN: 7.2 g/dL (ref 6.1–8.1)
Total Bilirubin: 0.4 mg/dL (ref 0.2–1.2)

## 2018-08-08 LAB — CBC
HEMATOCRIT: 51.2 % — AB (ref 38.5–50.0)
HEMOGLOBIN: 17.3 g/dL — AB (ref 13.2–17.1)
MCH: 29.9 pg (ref 27.0–33.0)
MCHC: 33.8 g/dL (ref 32.0–36.0)
MCV: 88.6 fL (ref 80.0–100.0)
MPV: 11.7 fL (ref 7.5–12.5)
Platelets: 211 10*3/uL (ref 140–400)
RBC: 5.78 10*6/uL (ref 4.20–5.80)
RDW: 13 % (ref 11.0–15.0)
WBC: 7.9 10*3/uL (ref 3.8–10.8)

## 2018-08-08 LAB — TSH: TSH: 0.98 m[IU]/L (ref 0.40–4.50)

## 2018-08-08 LAB — LIPID PANEL
CHOL/HDL RATIO: 3.3 (calc) (ref <5.0)
CHOLESTEROL: 161 mg/dL (ref <200)
HDL: 49 mg/dL (ref >=40)
LDL Cholesterol (Calc): 88 mg/dL (calc)
Non-HDL Cholesterol (Calc): 112 mg/dL (calc) (ref <130)
Triglycerides: 140 mg/dL (ref <150)

## 2018-08-11 NOTE — Telephone Encounter (Signed)
Entered chart for Refill requested

## 2018-08-13 ENCOUNTER — Telehealth: Payer: Self-pay | Admitting: Family Medicine

## 2018-08-13 NOTE — Telephone Encounter (Signed)
Copied from [place] [id number]. Topic: Quick Communication - Lab Results (Clinic Use ONLY) >> Aug 13, 2018 10:33 AM Marin Roberts, RMA wrote: Called patient to inform them of 08/07/18 lab results. When patient returns call, triage nurse may disclose results. >> Aug 13, 2018  4:33 PM Wynetta Emery, [place] C wrote: Pt is returning call for result

## 2018-08-13 NOTE — Telephone Encounter (Signed)
Copied from [place] [id number]. Topic: Quick Communication - Lab Results (Clinic Use ONLY) >> Aug 13, 2018 10:33 AM Marin Roberts, RMA wrote: Called patient to inform them of 08/07/18 lab results. When patient returns call, triage nurse may disclose results. >> Aug 13, 2018  4:33 PM Wynetta Emery, [place] C wrote: Pt is returning call for result

## 2018-08-13 NOTE — Progress Notes (Signed)
-  Glucose is quite high, keep appt with endo next month.   -I would recommend starting low dose cholesterol medication to lower cardiovascular disease risk.  If he agrees please send atorvastatin 20mg  #90 w/ 1 refill.   -Other labs look ok.

## 2018-08-13 NOTE — Telephone Encounter (Signed)
Pt given lab results per notes of Dr. Zigmund Daniel on 08/13/18. Pt verbalized understanding. He says he will discuss his labs with his wife and let us know if he will take Atorvastatin. Unable to document in result note.

## 2018-08-16 ENCOUNTER — Other Ambulatory Visit: Payer: Self-pay | Admitting: Internal Medicine

## 2018-09-08 ENCOUNTER — Other Ambulatory Visit: Payer: Self-pay | Admitting: Internal Medicine

## 2018-09-22 ENCOUNTER — Encounter: Payer: Self-pay | Admitting: Internal Medicine

## 2018-09-22 ENCOUNTER — Other Ambulatory Visit: Payer: Self-pay

## 2018-09-22 ENCOUNTER — Ambulatory Visit: Payer: 59 | Admitting: Internal Medicine

## 2018-09-22 ENCOUNTER — Ambulatory Visit (INDEPENDENT_AMBULATORY_CARE_PROVIDER_SITE_OTHER): Payer: 59 | Admitting: Internal Medicine

## 2018-09-22 DIAGNOSIS — N521 Erectile dysfunction due to diseases classified elsewhere: Secondary | ICD-10-CM | POA: Diagnosis not present

## 2018-09-22 DIAGNOSIS — Z6841 Body Mass Index (BMI) 40.0 and over, adult: Secondary | ICD-10-CM | POA: Diagnosis not present

## 2018-09-22 DIAGNOSIS — E1159 Type 2 diabetes mellitus with other circulatory complications: Secondary | ICD-10-CM | POA: Diagnosis not present

## 2018-09-22 MED ORDER — INSULIN ASPART 100 UNIT/ML FLEXPEN: PEN_INJECTOR | SUBCUTANEOUS | 5 refills | Status: DC

## 2018-09-22 MED ORDER — METFORMIN HCL ER 500 MG PO TB24: 500.0000 mg | ORAL_TABLET | Freq: Two times a day (BID) | ORAL | 3 refills | Status: DC

## 2018-09-22 MED ORDER — FREESTYLE LIBRE 14 DAY SENSOR MISC: 1.0000 | 3 refills | Status: DC
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
## 2018-09-22 NOTE — Patient Instructions (Addendum)
Please continue: - Metformin XR 500 mg 2x a day, with meals - Basaglar 30 units at bedtime - Novolog 15 min before meals:   16 units before a smaller meal  20 units before a regular meal  24 units before a larger meal of if you have dessert  Please change breakfast as we discussed.  If sugars are not better in 2 weeks after changing breakfast, please let me know, in that case, will need to increase NovoLog with this meal.  Please return in 3 months with your sugar log.

## 2018-09-22 NOTE — Progress Notes (Signed)
Spoke with patient ahead of scheduled virtual visit with Dr. Cruzita Lederer.  Patient correctly identified.  Verified patient's medications, drug allergies and pharmacy. Invite link sent.

## 2018-11-26 ENCOUNTER — Other Ambulatory Visit: Payer: Self-pay | Admitting: Internal Medicine

## 2018-12-02 ENCOUNTER — Other Ambulatory Visit: Payer: Self-pay

## 2018-12-02 ENCOUNTER — Ambulatory Visit: Payer: 59 | Admitting: Family Medicine

## 2018-12-02 ENCOUNTER — Encounter: Payer: Self-pay | Admitting: Family Medicine

## 2018-12-02 ENCOUNTER — Ambulatory Visit (INDEPENDENT_AMBULATORY_CARE_PROVIDER_SITE_OTHER): Payer: 59

## 2018-12-02 VITALS — BP 178/100 | HR 95 | Temp 98.7°F | Resp 24 | Ht 74.0 in | Wt 395.4 lb

## 2018-12-02 DIAGNOSIS — R0602 Shortness of breath: Secondary | ICD-10-CM | POA: Diagnosis not present

## 2018-12-02 DIAGNOSIS — R0600 Dyspnea, unspecified: Secondary | ICD-10-CM | POA: Diagnosis not present

## 2018-12-02 DIAGNOSIS — I10 Essential (primary) hypertension: Secondary | ICD-10-CM

## 2018-12-02 DIAGNOSIS — R05 Cough: Secondary | ICD-10-CM

## 2018-12-02 DIAGNOSIS — R059 Cough, unspecified: Secondary | ICD-10-CM

## 2018-12-02 MED ORDER — FUROSEMIDE 20 MG PO TABS: 20.0000 mg | ORAL_TABLET | Freq: Every day | ORAL | 3 refills | Status: DC

## 2018-12-02 MED ORDER — DULOXETINE HCL 30 MG PO CPEP: 30.0000 mg | ORAL_CAPSULE | Freq: Every day | ORAL | 3 refills | Status: DC

## 2018-12-02 NOTE — Patient Instructions (Addendum)
Start lasix 20 mg twice per day for 5 days then once  Daily thereafter. We'll be in touch with lab and xray results Continue current medications.  See me again in 2 weeks.

## 2018-12-03 ENCOUNTER — Encounter: Payer: Self-pay | Admitting: Family Medicine

## 2018-12-03 DIAGNOSIS — R0600 Dyspnea, unspecified: Secondary | ICD-10-CM | POA: Insufficient documentation

## 2018-12-03 LAB — CBC WITH DIFFERENTIAL/PLATELET
Basophils Absolute: 0.1 10*3/uL (ref 0.0–0.1)
Basophils Relative: 1.3 % (ref 0.0–3.0)
Eosinophils Absolute: 0.1 10*3/uL (ref 0.0–0.7)
Eosinophils Relative: 1.5 % (ref 0.0–5.0)
HCT: 50.3 % (ref 39.0–52.0)
Hemoglobin: 17.1 g/dL — ABNORMAL HIGH (ref 13.0–17.0)
Lymphocytes Relative: 36.1 % (ref 12.0–46.0)
Lymphs Abs: 2.9 10*3/uL (ref 0.7–4.0)
MCHC: 34 g/dL (ref 30.0–36.0)
MCV: 89.5 fl (ref 78.0–100.0)
Monocytes Absolute: 0.6 10*3/uL (ref 0.1–1.0)
Monocytes Relative: 7.6 % (ref 3.0–12.0)
Neutro Abs: 4.4 10*3/uL (ref 1.4–7.7)
Neutrophils Relative %: 53.5 % (ref 43.0–77.0)
Platelets: 207 10*3/uL (ref 150.0–400.0)
RBC: 5.62 Mil/uL (ref 4.22–5.81)
RDW: 14.2 % (ref 11.5–15.5)
WBC: 8.2 10*3/uL (ref 4.0–10.5)

## 2018-12-03 LAB — BASIC METABOLIC PANEL
BUN: 17 mg/dL (ref 6–23)
CO2: 26 mEq/L (ref 19–32)
Calcium: 9.5 mg/dL (ref 8.4–10.5)
Chloride: 98 mEq/L (ref 96–112)
Creatinine, Ser: 0.96 mg/dL (ref 0.40–1.50)
GFR: 96.87 mL/min (ref >60.00)
Glucose, Bld: 280 mg/dL — ABNORMAL HIGH (ref 70–99)
Potassium: 3.8 mEq/L (ref 3.5–5.1)
Sodium: 138 mEq/L (ref 135–145)

## 2018-12-03 LAB — BRAIN NATRIURETIC PEPTIDE: Pro B Natriuretic peptide (BNP): 45 pg/mL (ref 0.0–100.0)

## 2018-12-03 NOTE — Assessment & Plan Note (Addendum)
-  BP elevated today with signs of hypervolemia including edema and lung crackles on exam.  No prior history of CHF but has risk factors.  Reports compliance with CPAP.  Will obtain CXR, check BNP today.  Start furosemide 20mg  BID x5 days, then 20mg  daily.  If BNP elevated or signs consistent with edema on CXR will obtain echo. I will see him back in about 2 weeks.
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
## 2018-12-03 NOTE — Progress Notes (Signed)
Some areas of the lower lung that do not look like they fully inflate on inspiration (atelectasis).  No appearance of pneumonia or excess fluid in the lungs.  Would recommend working on deep breathing to improve this.  Incentive spirometer may help  with this as well.

## 2018-12-07 ENCOUNTER — Encounter: Payer: Self-pay | Admitting: Family Medicine

## 2018-12-07 NOTE — Telephone Encounter (Signed)
Called Pt and released to MyChart .

## 2018-12-16 ENCOUNTER — Ambulatory Visit: Payer: 59 | Admitting: Family Medicine

## 2018-12-16 ENCOUNTER — Telehealth: Payer: Self-pay | Admitting: Behavioral Health

## 2018-12-16 ENCOUNTER — Encounter: Payer: Self-pay | Admitting: Family Medicine

## 2018-12-16 VITALS — BP 170/98 | HR 84 | Temp 98.2°F | Resp 22 | Ht 74.0 in | Wt 388.2 lb

## 2018-12-16 DIAGNOSIS — R0600 Dyspnea, unspecified: Secondary | ICD-10-CM

## 2018-12-16 DIAGNOSIS — I10 Essential (primary) hypertension: Secondary | ICD-10-CM

## 2018-12-16 DIAGNOSIS — I110 Hypertensive heart disease with heart failure: Secondary | ICD-10-CM

## 2018-12-16 DIAGNOSIS — I509 Heart failure, unspecified: Secondary | ICD-10-CM | POA: Diagnosis not present
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
## 2018-12-16 NOTE — Telephone Encounter (Signed)

## 2018-12-16 NOTE — Patient Instructions (Signed)
Continue lasix as needed.  I would recommend every other day for now.   We'll be in touch with labs and I have ordered an ultrasound of your heart (Echocardiogram)   Echocardiogram An echocardiogram is a procedure that uses painless sound waves (ultrasound) to produce an image of the heart. Images from an echocardiogram can provide important information about:  Signs of coronary artery disease (CAD).  Aneurysm detection. An aneurysm is a weak or damaged part of an artery wall that bulges out from the normal force of blood pumping through the body.  Heart size and shape. Changes in the size or shape of the heart can be associated with certain conditions, including heart failure, aneurysm, and CAD.  Heart muscle function.  Heart valve function.  Signs of a past heart attack.  Fluid buildup around the heart.  Thickening of the heart muscle.  A tumor or infectious growth around the heart valves. Tell a health care provider about:  Any allergies you have.  All medicines you are taking, including vitamins, herbs, eye drops, creams, and over-the-counter medicines.  Any blood disorders you have.  Any surgeries you have had.  Any medical conditions you have.  Whether you are pregnant or may be pregnant. What are the risks? Generally, this is a safe procedure. However, problems may occur, including:  Allergic reaction to dye (contrast) that may be used during the procedure. What happens before the procedure? No specific preparation is needed. You may eat and drink normally. What happens during the procedure?   An IV tube may be inserted into one of your veins.  You may receive contrast through this tube. A contrast is an injection that improves the quality of the pictures from your heart.  A gel will be applied to your chest.  A wand-like tool (transducer) will be moved over your chest. The gel will help to transmit the sound waves from the transducer.  The sound waves  will harmlessly bounce off of your heart to allow the heart images to be captured in real-time motion. The images will be recorded on a computer. The procedure may vary among health care providers and hospitals. What happens after the procedure?  You may return to your normal, everyday life, including diet, activities, and medicines, unless your health care provider tells you not to do that. Summary  An echocardiogram is a procedure that uses painless sound waves (ultrasound) to produce an image of the heart.  Images from an echocardiogram can provide important information about the size and shape of your heart, heart muscle function, heart valve function, and fluid buildup around your heart.  You do not need to do anything to prepare before this procedure. You may eat and drink normally.  After the echocardiogram is completed, you may return to your normal, everyday life, unless your health care provider tells you not to do that. This information is not intended to replace advice given to you by your health care provider. Make sure you discuss any questions you have with your health care provider. Document Released: 05/24/2000 Document Revised: 09/17/2018 Document Reviewed: 06/29/2016 Elsevier Patient Education  2020 [employer].

## 2018-12-17 LAB — BASIC METABOLIC PANEL
BUN: 20 mg/dL (ref 6–23)
CO2: 28 mEq/L (ref 19–32)
Calcium: 9.5 mg/dL (ref 8.4–10.5)
Chloride: 97 mEq/L (ref 96–112)
Creatinine, Ser: 1.11 mg/dL (ref 0.40–1.50)
GFR: 81.92 mL/min (ref >60.00)
Glucose, Bld: 314 mg/dL — ABNORMAL HIGH (ref 70–99)
Potassium: 4.3 mEq/L (ref 3.5–5.1)
Sodium: 136 mEq/L (ref 135–145)

## 2018-12-18 ENCOUNTER — Encounter: Payer: Self-pay | Admitting: Family Medicine

## 2018-12-18 MED ORDER — LISINOPRIL-HYDROCHLOROTHIAZIDE 20-25 MG PO TABS: 1.0000 | ORAL_TABLET | Freq: Every day | ORAL | 1 refills | Status: DC

## 2018-12-18 NOTE — Assessment & Plan Note (Signed)
-  BP elevated, increase lisinopril/hctz

## 2018-12-18 NOTE — Assessment & Plan Note (Signed)
-  Consistent with CHF exacerbation, despite normal BNP. Symptoms greatly improved with diuresis. Weight down about 10lbs over the past 2 weeks.  -Will update renal function and potassium. -Continue lasix every other day for now.  -ECHO ordered

## 2018-12-22 ENCOUNTER — Encounter: Payer: Self-pay | Admitting: Family Medicine

## 2018-12-24 NOTE — Progress Notes (Signed)
Have tried to call Pt for 1.5 weeks now. Left multiple voicemail messages and labs, increase Lisinopril-HCTZ..  Will send letter to notify Pt to call make 8 wk F/U and make sure Pt is understanding the directions left on voicemail.

## 2019-01-07 ENCOUNTER — Other Ambulatory Visit: Payer: Self-pay | Admitting: Internal Medicine

## 2019-01-15 ENCOUNTER — Telehealth: Payer: Self-pay

## 2019-01-15 NOTE — Telephone Encounter (Signed)
Received a fax from [facility] stating that Lisinopril- HCTZ is on long term back order. Okay to send in separate?

## 2019-01-15 NOTE — Telephone Encounter (Signed)
Yes, ok to send as separate prescriptions.  Thanks!

## 2019-01-15 NOTE — Telephone Encounter (Signed)
Error. Dr. Zigmund Daniel increased his lisinopril-HCTZ to 20-25. [facility] was trying to split up an old prescription.  Pt did not need. Did not refill medication. Thanks.

## 2019-01-19 ENCOUNTER — Ambulatory Visit (HOSPITAL_COMMUNITY): Payer: 59 | Attending: Cardiology

## 2019-01-19 ENCOUNTER — Other Ambulatory Visit: Payer: Self-pay

## 2019-01-19 DIAGNOSIS — I509 Heart failure, unspecified: Secondary | ICD-10-CM | POA: Diagnosis not present

## 2019-01-19 MED ORDER — PERFLUTREN LIPID MICROSPHERE
1.0000 mL | INTRAVENOUS | Status: AC | PRN
  Administered 2019-01-19: 2 mL via INTRAVENOUS

## 2019-01-30 ENCOUNTER — Other Ambulatory Visit: Payer: Self-pay | Admitting: Internal Medicine

## 2019-02-01 ENCOUNTER — Other Ambulatory Visit: Payer: Self-pay

## 2019-02-02 ENCOUNTER — Ambulatory Visit: Payer: 59 | Admitting: Internal Medicine

## 2019-02-09 ENCOUNTER — Telehealth: Payer: Self-pay

## 2019-02-09 NOTE — Telephone Encounter (Signed)
Questions for Screening COVID-19  Symptom onset: n/a  Travel or Contacts: no  During this illness, did/does the patient experience any of the following symptoms? Fever >100.47F []   Yes [x]   No []   Unknown Subjective fever (felt feverish) []   Yes [x]   No []   Unknown Chills []   Yes [x]   No []   Unknown Muscle aches (myalgia) []   Yes [x]   No []   Unknown Runny nose (rhinorrhea) []   Yes [x]   No []   Unknown Sore throat []   Yes [x]   No []   Unknown Cough (new onset or worsening of chronic cough) []   Yes [x]   No []   Unknown Shortness of breath (dyspnea) []   Yes [x]   No []   Unknown Nausea or vomiting []   Yes [x]   No []   Unknown Headache []   Yes [x]   No []   Unknown Abdominal pain  []   Yes [x]   No []   Unknown Diarrhea (?3 loose/looser than normal stools/24hr period) []   Yes [x]   No []   Unknown Other, specify:  Patient risk factors: Smoker? []   Current []   Former []   Never If male, currently pregnant? []   Yes []   No  Patient Active Problem List   Diagnosis Date Noted  . Dyspnea 12/03/2018  . Type 2 diabetes mellitus with circulatory disorder causing erectile dysfunction ([place]) 08/03/2015  . Microalbuminuria 01/12/2015  . Spinal stenosis of lumbar region with radiculopathy 02/20/2012  . History of stomach ulcers 07/29/2011  . OSA (obstructive sleep apnea) 07/29/2011  . Hypertension 07/29/2011  . ADD (attention deficit disorder with hyperactivity) 07/29/2011  . ERECTILE DYSFUNCTION, ORGANIC 12/05/2008  . ADD 08/11/2007  . Obesity 01/19/2007  . SCLEROSIS, MULTIPLE 01/19/2007    Plan:  []   High risk for COVID-19 with red flags go to ED (with CP, SOB, weak/lightheaded, or fever > 101.5). Call ahead.  []   High risk for COVID-19 but stable. Inform provider and coordinate time for [facility] visit.   []   No red flags but URI signs or symptoms okay for [facility] visit.

## 2019-02-10 ENCOUNTER — Ambulatory Visit: Payer: 59 | Admitting: Family Medicine

## 2019-02-17 ENCOUNTER — Other Ambulatory Visit: Payer: Self-pay

## 2019-02-17 ENCOUNTER — Ambulatory Visit: Payer: 59 | Admitting: Family Medicine

## 2019-02-17 ENCOUNTER — Encounter: Payer: Self-pay | Admitting: Family Medicine

## 2019-02-17 VITALS — BP 140/80 | HR 110 | Ht 74.0 in | Wt 390.0 lb

## 2019-02-17 DIAGNOSIS — I10 Essential (primary) hypertension: Secondary | ICD-10-CM

## 2019-02-17 DIAGNOSIS — Z1159 Encounter for screening for other viral diseases: Secondary | ICD-10-CM | POA: Diagnosis not present

## 2019-02-17 DIAGNOSIS — E785 Hyperlipidemia, unspecified: Secondary | ICD-10-CM

## 2019-02-17 DIAGNOSIS — N521 Erectile dysfunction due to diseases classified elsewhere: Secondary | ICD-10-CM | POA: Diagnosis not present

## 2019-02-17 DIAGNOSIS — E1169 Type 2 diabetes mellitus with other specified complication: Secondary | ICD-10-CM

## 2019-02-17 DIAGNOSIS — R0600 Dyspnea, unspecified: Secondary | ICD-10-CM

## 2019-02-17 DIAGNOSIS — E1159 Type 2 diabetes mellitus with other circulatory complications: Secondary | ICD-10-CM

## 2019-02-17 NOTE — Patient Instructions (Signed)
Continue current medications  We'll be in touch with lab results Be sure to have yearly diabetic eye exam.  If you have had this already please have your eye doctor send [place] results.
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
## 2019-02-17 NOTE — Assessment & Plan Note (Signed)
-  Followed by endocrinology.  -Due for updated A1c will go ahead and get this with other lab work today.  -Foot exam completed today -Update lipid panel, recommended statin previously however he does not recall discussing this.

## 2019-02-17 NOTE — Assessment & Plan Note (Signed)
-  improved with lasix, likely diastolic failure as Echo shows preserved EF.   -Update renal function and potassium today.

## 2019-02-17 NOTE — Assessment & Plan Note (Signed)
-  BP is mildly elevated today.  -Continue to cut back on salt intake.  -Check BP at home intermittently.

## 2019-02-18 LAB — LIPID PANEL
Cholesterol: 167 mg/dL (ref 0–200)
HDL: 51.7 mg/dL (ref >39.00)
LDL Cholesterol: 78 mg/dL (ref 0–99)
NonHDL: 115.59
Total CHOL/HDL Ratio: 3
Triglycerides: 188 mg/dL — ABNORMAL HIGH (ref 0.0–149.0)
VLDL: 37.6 mg/dL (ref 0.0–40.0)

## 2019-02-18 LAB — BASIC METABOLIC PANEL
BUN: 16 mg/dL (ref 6–23)
CO2: 28 mEq/L (ref 19–32)
Calcium: 9.8 mg/dL (ref 8.4–10.5)
Chloride: 96 mEq/L (ref 96–112)
Creatinine, Ser: 0.96 mg/dL (ref 0.40–1.50)
GFR: 96.8 mL/min (ref >60.00)
Glucose, Bld: 286 mg/dL — ABNORMAL HIGH (ref 70–99)
Potassium: 4.1 mEq/L (ref 3.5–5.1)
Sodium: 135 mEq/L (ref 135–145)

## 2019-02-18 LAB — HEPATITIS C ANTIBODY
Hepatitis C Ab: NONREACTIVE
SIGNAL TO CUT-OFF: 0.05 (ref <1.00)

## 2019-02-18 LAB — HEMOGLOBIN A1C: Hgb A1c MFr Bld: 12 % — ABNORMAL HIGH (ref 4.6–6.5)

## 2019-02-22 ENCOUNTER — Ambulatory Visit: Payer: 59 | Admitting: Internal Medicine

## 2019-04-08 ENCOUNTER — Encounter: Payer: Self-pay | Admitting: Internal Medicine

## 2019-04-08 ENCOUNTER — Other Ambulatory Visit: Payer: Self-pay

## 2019-04-08 ENCOUNTER — Ambulatory Visit (INDEPENDENT_AMBULATORY_CARE_PROVIDER_SITE_OTHER): Payer: 59 | Admitting: Internal Medicine

## 2019-04-08 VITALS — BP 140/88 | HR 86 | Ht 74.0 in | Wt 387.0 lb

## 2019-04-08 DIAGNOSIS — N521 Erectile dysfunction due to diseases classified elsewhere: Secondary | ICD-10-CM | POA: Diagnosis not present

## 2019-04-08 DIAGNOSIS — Z23 Encounter for immunization: Secondary | ICD-10-CM

## 2019-04-08 DIAGNOSIS — E1159 Type 2 diabetes mellitus with other circulatory complications: Secondary | ICD-10-CM | POA: Diagnosis not present

## 2019-04-08 LAB — POCT GLYCOSYLATED HEMOGLOBIN (HGB A1C): Hemoglobin A1C: 10.9 % — AB (ref 4.0–5.6)
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
## 2019-04-08 NOTE — Patient Instructions (Addendum)
Please continue: - Metformin XR 500 mg 2x a day, with meals - Basaglar 37 units at bedtime  Please increase: - Novolog 15 min before meals:   27 units before a smaller meal  32 units before a regular meal  36 units before a larger meal of if you have dessert  Try to change breakfast: yoghurt + fruit (berries) + unsalted nuts + whole grain toast slice.  Please return in 3-4 months.

## 2019-04-11 ENCOUNTER — Encounter: Payer: Self-pay | Admitting: Family Medicine

## 2019-04-12 ENCOUNTER — Other Ambulatory Visit: Payer: Self-pay | Admitting: Family Medicine

## 2019-04-12 MED ORDER — DULOXETINE HCL 30 MG PO CPEP: 30.0000 mg | ORAL_CAPSULE | Freq: Every day | ORAL | 3 refills | Status: DC

## 2019-04-20 DIAGNOSIS — E119 Type 2 diabetes mellitus without complications: Secondary | ICD-10-CM | POA: Diagnosis not present

## 2019-04-20 DIAGNOSIS — H524 Presbyopia: Secondary | ICD-10-CM | POA: Diagnosis not present

## 2019-04-20 DIAGNOSIS — H2511 Age-related nuclear cataract, right eye: Secondary | ICD-10-CM | POA: Diagnosis not present

## 2019-04-20 DIAGNOSIS — H469 Unspecified optic neuritis: Secondary | ICD-10-CM | POA: Diagnosis not present

## 2019-04-20 DIAGNOSIS — H538 Other visual disturbances: Secondary | ICD-10-CM | POA: Diagnosis not present

## 2019-05-03 ENCOUNTER — Other Ambulatory Visit: Payer: Self-pay

## 2019-05-03 MED ORDER — NOVOLOG FLEXPEN 100 UNIT/ML ~~LOC~~ SOPN: 27.0000 [IU] | PEN_INJECTOR | Freq: Three times a day (TID) | SUBCUTANEOUS | 2 refills | Status: DC

## 2019-06-05 ENCOUNTER — Other Ambulatory Visit: Payer: Self-pay | Admitting: Family Medicine

## 2019-06-14 ENCOUNTER — Telehealth: Payer: Self-pay

## 2019-06-14 NOTE — Telephone Encounter (Signed)
MEDICATION: Insulin Pen Needle (BD PEN NEEDLE NANO U/F) 32G X 4 MM MISC  PHARMACY:  [id number] - [place], [place] - [address] AT [place] OF [address]  IS THIS A 90 DAY SUPPLY :   IS PATIENT OUT OF MEDICATION:   IF NOT; HOW MUCH IS LEFT:   LAST APPOINTMENT DATE: @11 /23/2020  NEXT APPOINTMENT DATE:@3 /07/2019  DO WE HAVE YOUR PERMISSION TO LEAVE A DETAILED MESSAGE:  OTHER COMMENTS: needs a new Rx sent in for the Rx   **Let patient know to contact pharmacy at the end of the day to make sure medication is ready. **  ** Please notify patient to allow 48-72 hours to process**  **Encourage patient to contact the pharmacy for refills or they can request refills through [facility]**

## 2019-06-15 MED ORDER — BD PEN NEEDLE NANO U/F 32G X 4 MM MISC: 5 refills | Status: DC

## 2019-06-15 NOTE — Telephone Encounter (Signed)
RX sent

## 2019-06-28 ENCOUNTER — Other Ambulatory Visit: Payer: Self-pay

## 2019-06-29 ENCOUNTER — Ambulatory Visit (INDEPENDENT_AMBULATORY_CARE_PROVIDER_SITE_OTHER): Payer: 59

## 2019-06-29 ENCOUNTER — Encounter: Payer: Self-pay | Admitting: Family Medicine

## 2019-06-29 ENCOUNTER — Ambulatory Visit: Payer: 59 | Admitting: Family Medicine

## 2019-06-29 VITALS — BP 118/80 | HR 92 | Temp 96.8°F | Ht 74.0 in | Wt 386.2 lb

## 2019-06-29 DIAGNOSIS — R109 Unspecified abdominal pain: Secondary | ICD-10-CM | POA: Diagnosis not present

## 2019-06-29 DIAGNOSIS — M25559 Pain in unspecified hip: Secondary | ICD-10-CM

## 2019-06-29 DIAGNOSIS — I10 Essential (primary) hypertension: Secondary | ICD-10-CM

## 2019-06-29 DIAGNOSIS — M160 Bilateral primary osteoarthritis of hip: Secondary | ICD-10-CM | POA: Diagnosis not present

## 2019-06-29 NOTE — Patient Instructions (Signed)
Reduce lasix to 1/2 tab MWF and on weekends   Low Back Sprain or Strain Rehab Ask your health care provider which exercises are safe for you. Do exercises exactly as told by your health care provider and adjust them as directed. It is normal to feel mild stretching, pulling, tightness, or discomfort as you do these exercises. Stop right away if you feel sudden pain or your pain gets worse. Do not begin these exercises until told by your health care provider. Stretching and range-of-motion exercises These exercises warm up your muscles and joints and improve the movement and flexibility of your back. These exercises also help to relieve pain, numbness, and tingling. Lumbar rotation  1. Lie on your back on a firm surface and bend your knees. 2. Straighten your arms out to your sides so each arm forms a 90-degree angle (right angle) with a side of your body. 3. Slowly move (rotate) both of your knees to one side of your body until you feel a stretch in your lower back (lumbar). Try not to let your shoulders lift off the floor. 4. Hold this position for __________ seconds. 5. Tense your abdominal muscles and slowly move your knees back to the starting position. 6. Repeat this exercise on the other side of your body. Repeat __________ times. Complete this exercise __________ times a day. Single knee to chest  1. Lie on your back on a firm surface with both legs straight. 2. Bend one of your knees. Use your hands to move your knee up toward your chest until you feel a gentle stretch in your lower back and buttock. ? Hold your leg in this position by holding on to the front of your knee. ? Keep your other leg as straight as possible. 3. Hold this position for __________ seconds. 4. Slowly return to the starting position. 5. Repeat with your other leg. Repeat __________ times. Complete this exercise __________ times a day. Prone extension on elbows  1. Lie on your abdomen on a firm surface (prone  position). 2. Prop yourself up on your elbows. 3. Use your arms to help lift your chest up until you feel a gentle stretch in your abdomen and your lower back. ? This will place some of your body weight on your elbows. If this is uncomfortable, try stacking pillows under your chest. ? Your hips should stay down, against the surface that you are lying on. Keep your hip and back muscles relaxed. 4. Hold this position for __________ seconds. 5. Slowly relax your upper body and return to the starting position. Repeat __________ times. Complete this exercise __________ times a day. Strengthening exercises These exercises build strength and endurance in your back. Endurance is the ability to use your muscles for a long time, even after they get tired. Pelvic tilt This exercise strengthens the muscles that lie deep in the abdomen. 1. Lie on your back on a firm surface. Bend your knees and keep your feet flat on the floor. 2. Tense your abdominal muscles. Tip your pelvis up toward the ceiling and flatten your lower back into the floor. ? To help with this exercise, you may place a small towel under your lower back and try to push your back into the towel. 3. Hold this position for __________ seconds. 4. Let your muscles relax completely before you repeat this exercise. Repeat __________ times. Complete this exercise __________ times a day. Alternating arm and leg raises  1. Get on your hands and knees on  a firm surface. If you are on a hard floor, you may want to use padding, such as an exercise mat, to cushion your knees. 2. Line up your arms and legs. Your hands should be directly below your shoulders, and your knees should be directly below your hips. 3. Lift your left leg behind you. At the same time, raise your right arm and straighten it in front of you. ? Do not lift your leg higher than your hip. ? Do not lift your arm higher than your shoulder. ? Keep your abdominal and back muscles  tight. ? Keep your hips facing the ground. ? Do not arch your back. ? Keep your balance carefully, and do not hold your breath. 4. Hold this position for __________ seconds. 5. Slowly return to the starting position. 6. Repeat with your right leg and your left arm. Repeat __________ times. Complete this exercise __________ times a day. Abdominal set with straight leg raise  1. Lie on your back on a firm surface. 2. Bend one of your knees and keep your other leg straight. 3. Tense your abdominal muscles and lift your straight leg up, 4-6 inches (10-15 cm) off the ground. 4. Keep your abdominal muscles tight and hold this position for __________ seconds. ? Do not hold your breath. ? Do not arch your back. Keep it flat against the ground. 5. Keep your abdominal muscles tense as you slowly lower your leg back to the starting position. 6. Repeat with your other leg. Repeat __________ times. Complete this exercise __________ times a day. Single leg lower with bent knees 1. Lie on your back on a firm surface. 2. Tense your abdominal muscles and lift your feet off the floor, one foot at a time, so your knees and hips are bent in 90-degree angles (right angles). ? Your knees should be over your hips and your lower legs should be parallel to the floor. 3. Keeping your abdominal muscles tense and your knee bent, slowly lower one of your legs so your toe touches the ground. 4. Lift your leg back up to return to the starting position. ? Do not hold your breath. ? Do not let your back arch. Keep your back flat against the ground. 5. Repeat with your other leg. Repeat __________ times. Complete this exercise __________ times a day. Posture and body mechanics Good posture and healthy body mechanics can help to relieve stress in your body's tissues and joints. Body mechanics refers to the movements and positions of your body while you do your daily activities. Posture is part of body mechanics. Good  posture means:  Your spine is in its natural S-curve position (neutral).  Your shoulders are pulled back slightly.  Your head is not tipped forward. Follow these guidelines to improve your posture and body mechanics in your everyday activities. Standing   When standing, keep your spine neutral and your feet about hip width apart. Keep a slight bend in your knees. Your ears, shoulders, and hips should line up.  When you do a task in which you stand in one place for a long time, place one foot up on a stable object that is 2-4 inches (5-10 cm) high, such as a footstool. This helps keep your spine neutral. Sitting   When sitting, keep your spine neutral and keep your feet flat on the floor. Use a footrest, if necessary, and keep your thighs parallel to the floor. Avoid rounding your shoulders, and avoid tilting your head forward.  When working at a desk or a computer, keep your desk at a height where your hands are slightly lower than your elbows. Slide your chair under your desk so you are close enough to maintain good posture.  When working at a computer, place your monitor at a height where you are looking straight ahead and you do not have to tilt your head forward or downward to look at the screen. Resting  When lying down and resting, avoid positions that are most painful for you.  If you have pain with activities such as sitting, bending, stooping, or squatting, lie in a position in which your body does not bend very much. For example, avoid curling up on your side with your arms and knees near your chest (fetal position).  If you have pain with activities such as standing for a long time or reaching with your arms, lie with your spine in a neutral position and bend your knees slightly. Try the following positions: ? Lying on your side with a pillow between your knees. ? Lying on your back with a pillow under your knees. Lifting   When lifting objects, keep your feet at least  shoulder width apart and tighten your abdominal muscles.  Bend your knees and hips and keep your spine neutral. It is important to lift using the strength of your legs, not your back. Do not lock your knees straight out.  Always ask for help to lift heavy or awkward objects. This information is not intended to replace advice given to you by your health care provider. Make sure you discuss any questions you have with your health care provider. Document Revised: 09/18/2018 Document Reviewed: 06/18/2018 Elsevier Patient Education  [place].

## 2019-06-30 LAB — BASIC METABOLIC PANEL
BUN: 24 mg/dL — ABNORMAL HIGH (ref 6–23)
CO2: 29 mEq/L (ref 19–32)
Calcium: 10.2 mg/dL (ref 8.4–10.5)
Chloride: 94 mEq/L — ABNORMAL LOW (ref 96–112)
Creatinine, Ser: 1 mg/dL (ref 0.40–1.50)
GFR: 92.23 mL/min (ref >60.00)
Glucose, Bld: 324 mg/dL — ABNORMAL HIGH (ref 70–99)
Potassium: 4.3 mEq/L (ref 3.5–5.1)
Sodium: 134 mEq/L — ABNORMAL LOW (ref 135–145)

## 2019-07-03 ENCOUNTER — Other Ambulatory Visit: Payer: Self-pay | Admitting: Internal Medicine

## 2019-07-05 DIAGNOSIS — R109 Unspecified abdominal pain: Secondary | ICD-10-CM | POA: Insufficient documentation

## 2019-07-05 NOTE — Assessment & Plan Note (Signed)
Continued pain in bilateral hips L>R.  Update xrays today given worsening of R hip pain as well.

## 2019-07-05 NOTE — Assessment & Plan Note (Signed)
BP is well controlled at this time.  No increased edema.  Continue current medications.  Will decrease lasix to 1/2 tab MWF and on weekends.
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
## 2019-07-05 NOTE — Assessment & Plan Note (Signed)
Seems to be MSK in nature.  Resolved at this point.  He will let me know if this returns.  Can consider having him see PT if this returns.

## 2019-07-09 ENCOUNTER — Other Ambulatory Visit: Payer: Self-pay | Admitting: Family Medicine

## 2019-07-09 ENCOUNTER — Encounter: Payer: Self-pay | Admitting: Family Medicine

## 2019-07-09 MED ORDER — DULOXETINE HCL 60 MG PO CPEP: 60.0000 mg | ORAL_CAPSULE | Freq: Every day | ORAL | 1 refills | Status: DC

## 2019-07-09 MED ORDER — SILDENAFIL CITRATE 100 MG PO TABS: ORAL_TABLET | ORAL | 9 refills | Status: DC

## 2019-07-09 NOTE — Telephone Encounter (Signed)
Duloxetine increased and sildenafil renewed

## 2019-07-13 ENCOUNTER — Telehealth: Payer: Self-pay | Admitting: Family Medicine

## 2019-07-13 NOTE — Telephone Encounter (Signed)
PA for Sildenafil started, waiting for result.   Brent Wallace (Key: [id number])

## 2019-07-15 MED ORDER — SILDENAFIL CITRATE 100 MG PO TABS: ORAL_TABLET | ORAL | 9 refills | Status: DC

## 2019-07-15 NOTE — Telephone Encounter (Signed)
Still waiting for PA to response.   Pt wants to try send this to [facility] on [address]. Rx sent.

## 2019-07-16 NOTE — Telephone Encounter (Signed)
PA denied due to prescription benefit plan does not cover the requested med.   Pt is aware, he going to check with [facility] and give [place] a cal back.

## 2019-08-10 ENCOUNTER — Ambulatory Visit: Payer: 59 | Admitting: Internal Medicine

## 2019-08-10 ENCOUNTER — Encounter: Payer: Self-pay | Admitting: Internal Medicine

## 2019-08-10 ENCOUNTER — Other Ambulatory Visit: Payer: Self-pay

## 2019-08-10 VITALS — BP 138/86 | HR 94 | Ht 74.0 in | Wt 384.0 lb

## 2019-08-10 DIAGNOSIS — N521 Erectile dysfunction due to diseases classified elsewhere: Secondary | ICD-10-CM | POA: Diagnosis not present

## 2019-08-10 DIAGNOSIS — E1159 Type 2 diabetes mellitus with other circulatory complications: Secondary | ICD-10-CM | POA: Diagnosis not present

## 2019-08-10 LAB — POCT GLYCOSYLATED HEMOGLOBIN (HGB A1C): Hemoglobin A1C: 12.4 % — AB (ref 4.0–5.6)
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
## 2019-08-10 NOTE — Patient Instructions (Addendum)
Please continue: - Metformin XR 500 mg 2x a day, with meals  Please increase: - Basaglar 45 >> 50 >> 55 units at bedtime - Novolog 34 units 15 min before meals   Please check with your insurance if Trulicity or Ozempic are covered.  Please schedule an appt with Antonieta Iba with nutrition.  Please return in 3-4 months.

## 2019-09-06 ENCOUNTER — Other Ambulatory Visit: Payer: Self-pay | Admitting: Internal Medicine

## 2019-10-06 ENCOUNTER — Other Ambulatory Visit: Payer: Self-pay | Admitting: *Deleted

## 2019-10-06 ENCOUNTER — Encounter: Payer: Self-pay | Admitting: *Deleted

## 2019-10-06 NOTE — Patient Outreach (Signed)
[facility] Care Management [place] Telephone Outreach, Routine (insurance) referral  10/06/2019  [name] [birth year] [id number]  Unsuccessful initial attempt/ telephone outreach to Brent Wallace, 59 y/o male referred to [place] on September 28, 2019 by [facility] CMA/ insurance plan referral.  Patient has had no recent hospitalizations.  Patient has history including, but not limited to, MS; obesity; OSA- on CPAP; HTN/ HLD; Attention deficit Disorder; and DM- Type II, on insulin, with last A1-C 12.4 (August 10, 2019).  HIPAA compliant voice mail message left for patient, requesting return call back.  Plan:  Will place [facility] CM unsuccessful patient outreach letter in mail requesting call back in writing  Will re-attempt [place] telephone outreach within 4 business days if I do not hear back from patient first  [name], RN, BSN, [employer] [facility] Care Management  [phone]

## 2019-10-08 ENCOUNTER — Other Ambulatory Visit: Payer: Self-pay | Admitting: Internal Medicine

## 2019-10-12 ENCOUNTER — Other Ambulatory Visit: Payer: Self-pay | Admitting: *Deleted

## 2019-10-12 ENCOUNTER — Encounter: Payer: Self-pay | Admitting: *Deleted

## 2019-10-12 NOTE — Patient Outreach (Signed)
[place]) Care Management [place], insurance referral/ new patient Unsuccessful (consecutive) outreach attempt # 2- new patient referral  10/12/2019  RAINEN WEHRMAN [birth year] [id number]  Unsuccessful (consecutive) second attempt/ telephone outreach to [name], 59 y/o male referred to [facility] on September 28, 2019 by [facility] CMA/ insurance plan referral.  Patient has had no recent hospitalizations.  Patient has history including, but not limited to, MS; obesity; OSA- on CPAP; HTN/ HLD; Attention deficit Disorder; and DM- Type II, on insulin, with last A1-C 12.4 (August 10, 2019).  HIPAA compliant voice mail message left for patient, requesting return call back.  Plan:  Verified [place] unsuccessful patient outreach letter placed in mail on October 06, 2019, requesting call back in writing  Will re-attempt [place] telephone outreach within 4 business days if I do not hear back from patient first  [name], RN, BSN, [employer] Coordinator [facility] Care Management  [phone]

## 2019-10-18 ENCOUNTER — Encounter: Payer: Self-pay | Admitting: *Deleted

## 2019-10-18 ENCOUNTER — Other Ambulatory Visit: Payer: Self-pay | Admitting: *Deleted

## 2019-10-18 NOTE — Patient Outreach (Signed)
[facility]) Care Management [place] Telephone Outreach, routine insurance referral Unsuccessful (consecutive) third outreach attempt without patient call back  10/18/2019  Brent Wallace 19-Dec-1960 [id number]  Unsuccessful (consecutive) third attempt/ telephone outreach to [name], 59 y/o male referred to [place] on September 28, 2019 by [facility] CMA/ insurance plan referral. Patient has had no recent hospitalizations.Patient has history including, but not limited to, MS; obesity; OSA- on CPAP; HTN/ HLD; Attention deficit Disorder; and DM- Type II, on insulin, with last A1-C 12.4 (August 10, 2019).  HIPAA compliant voice mail message left for patient, requesting return call back.  Plan:  Verified [facility] CM unsuccessful patient outreach letter placed in mail on October 06, 2019, requesting call back in writing  Will move to [place] case closure if I do not hear back from patient within 10 business days of initial outreach attempt (Oct 19, 2019)  [name], RN, BSN, CCRN [facility] Management  [phone]

## 2019-10-19 ENCOUNTER — Other Ambulatory Visit: Payer: Self-pay | Admitting: *Deleted

## 2019-10-19 NOTE — Patient Outreach (Signed)
[place] [facility]) Care Management [facility] CM case closure note/ documentation only  10/19/2019  Brent Wallace 13-Feb-1961 [id number]  [facility] CM Case Closure note re:  Brent Wallace, 59 y/o male referred to [place] on September 28, 2019 by [facility] CMA/ insurance plan referral. Patient has had no recent hospitalizations.Patient has history including, but not limited to, MS; obesity; OSA- on CPAP; HTN/ HLD; Attention deficit Disorder; and DM- Type II, on insulin, with last A1-C 12.4 (August 10, 2019).  Three consecutive outreach attempts have been made to patient over 10 business days, without patient response/ call back.  Plan:  [facility] CM unsuccessful patient outreach letter placedin mailon October 06, 2019,requesting call back in writing  Will close [facility] CM case, as 3 unsuccessful outreach attempts have been placed over 10 business days since initial outreach attempt and will make patient's PCP aware of same (will send [facility] CM case closure letter)  [name], RN, BSN, [place] Care Management  [phone]

## 2019-11-24 ENCOUNTER — Other Ambulatory Visit: Payer: Self-pay

## 2019-11-24 DIAGNOSIS — N521 Erectile dysfunction due to diseases classified elsewhere: Secondary | ICD-10-CM

## 2019-11-24 MED ORDER — INSULIN ASPART FLEXPEN 100 UNIT/ML ~~LOC~~ SOPN: 34.0000 [IU] | PEN_INJECTOR | Freq: Three times a day (TID) | SUBCUTANEOUS | 1 refills | Status: DC

## 2019-12-14 ENCOUNTER — Other Ambulatory Visit: Payer: Self-pay | Admitting: Internal Medicine

## 2019-12-16 ENCOUNTER — Ambulatory Visit: Payer: 59 | Admitting: Internal Medicine

## 2020-01-12 ENCOUNTER — Encounter: Payer: Self-pay | Admitting: Internal Medicine

## 2020-01-16 ENCOUNTER — Other Ambulatory Visit: Payer: Self-pay | Admitting: Family Medicine

## 2020-02-16 ENCOUNTER — Other Ambulatory Visit: Payer: Self-pay | Admitting: Osteopathic Medicine

## 2020-02-20 ENCOUNTER — Other Ambulatory Visit: Payer: Self-pay | Admitting: Internal Medicine

## 2020-02-20 DIAGNOSIS — E1159 Type 2 diabetes mellitus with other circulatory complications: Secondary | ICD-10-CM

## 2020-02-25 DIAGNOSIS — M25551 Pain in right hip: Secondary | ICD-10-CM | POA: Diagnosis not present

## 2020-02-25 DIAGNOSIS — M160 Bilateral primary osteoarthritis of hip: Secondary | ICD-10-CM | POA: Insufficient documentation

## 2020-02-25 DIAGNOSIS — E119 Type 2 diabetes mellitus without complications: Secondary | ICD-10-CM | POA: Diagnosis not present

## 2020-02-25 DIAGNOSIS — Z794 Long term (current) use of insulin: Secondary | ICD-10-CM | POA: Diagnosis not present

## 2020-02-25 DIAGNOSIS — M25552 Pain in left hip: Secondary | ICD-10-CM | POA: Diagnosis not present

## 2020-02-25 DIAGNOSIS — Z6841 Body Mass Index (BMI) 40.0 and over, adult: Secondary | ICD-10-CM | POA: Diagnosis not present

## 2020-03-06 ENCOUNTER — Other Ambulatory Visit: Payer: Self-pay | Admitting: Family Medicine

## 2020-03-11 ENCOUNTER — Other Ambulatory Visit: Payer: Self-pay | Admitting: Osteopathic Medicine

## 2020-03-20 ENCOUNTER — Other Ambulatory Visit: Payer: Self-pay | Admitting: Osteopathic Medicine

## 2020-03-23 ENCOUNTER — Encounter: Payer: Self-pay | Admitting: Internal Medicine

## 2020-03-23 ENCOUNTER — Ambulatory Visit (INDEPENDENT_AMBULATORY_CARE_PROVIDER_SITE_OTHER): Payer: 59 | Admitting: Internal Medicine

## 2020-03-23 ENCOUNTER — Other Ambulatory Visit: Payer: Self-pay

## 2020-03-23 VITALS — BP 142/92 | HR 89 | Ht 74.0 in | Wt 390.2 lb

## 2020-03-23 DIAGNOSIS — Z6841 Body Mass Index (BMI) 40.0 and over, adult: Secondary | ICD-10-CM | POA: Diagnosis not present

## 2020-03-23 DIAGNOSIS — N521 Erectile dysfunction due to diseases classified elsewhere: Secondary | ICD-10-CM | POA: Diagnosis not present

## 2020-03-23 DIAGNOSIS — E1159 Type 2 diabetes mellitus with other circulatory complications: Secondary | ICD-10-CM

## 2020-03-23 DIAGNOSIS — Z23 Encounter for immunization: Secondary | ICD-10-CM

## 2020-03-23 LAB — POCT GLYCOSYLATED HEMOGLOBIN (HGB A1C): Hemoglobin A1C: 9 % — AB (ref 4.0–5.6)

## 2020-03-23 MED ORDER — OZEMPIC (0.25 OR 0.5 MG/DOSE) 2 MG/1.5ML ~~LOC~~ SOPN: 0.5000 mg | PEN_INJECTOR | SUBCUTANEOUS | 3 refills | Status: DC

## 2020-03-23 NOTE — Patient Instructions (Addendum)
Please continue: - Metformin XR 500 mg 2x a day, with meals - Basaglar 38 units at bedtime  Please start: - Ozempic 0.25 mg weekly in a.m. (for example on Sunday morning) x 4 weeks, then increase to 0.5 mg weekly in a.m. if no nausea or hypoglycemia.  Please change: - Novolog 20-34 units 15 min before meals   Please return in 3 months.
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
## 2020-03-24 LAB — COMPREHENSIVE METABOLIC PANEL
ALT: 24 U/L (ref 0–53)
AST: 23 U/L (ref 0–37)
Albumin: 4.4 g/dL (ref 3.5–5.2)
Alkaline Phosphatase: 61 U/L (ref 39–117)
BUN: 13 mg/dL (ref 6–23)
CO2: 31 mEq/L (ref 19–32)
Calcium: 10 mg/dL (ref 8.4–10.5)
Chloride: 96 mEq/L (ref 96–112)
Creatinine, Ser: 0.99 mg/dL (ref 0.40–1.50)
GFR: 82.03 mL/min (ref >60.00)
Glucose, Bld: 129 mg/dL — ABNORMAL HIGH (ref 70–99)
Potassium: 3.8 mEq/L (ref 3.5–5.1)
Sodium: 136 mEq/L (ref 135–145)
Total Bilirubin: 0.6 mg/dL (ref 0.2–1.2)
Total Protein: 7.8 g/dL (ref 6.0–8.3)

## 2020-03-24 LAB — LIPID PANEL
Cholesterol: 172 mg/dL (ref 0–200)
HDL: 54.4 mg/dL (ref >39.00)
LDL Cholesterol: 100 mg/dL — ABNORMAL HIGH (ref 0–99)
NonHDL: 117.64
Total CHOL/HDL Ratio: 3
Triglycerides: 86 mg/dL (ref 0.0–149.0)
VLDL: 17.2 mg/dL (ref 0.0–40.0)

## 2020-03-24 LAB — MICROALBUMIN / CREATININE URINE RATIO
Creatinine,U: 66.9 mg/dL
Microalb Creat Ratio: 24.8 mg/g (ref 0.0–30.0)
Microalb, Ur: 16.6 mg/dL — ABNORMAL HIGH (ref 0.0–1.9)

## 2020-04-04 ENCOUNTER — Other Ambulatory Visit: Payer: Self-pay | Admitting: Family Medicine

## 2020-04-09 ENCOUNTER — Other Ambulatory Visit: Payer: Self-pay | Admitting: Family Medicine

## 2020-04-09 NOTE — Telephone Encounter (Signed)
Patient needs appt

## 2020-04-17 ENCOUNTER — Other Ambulatory Visit: Payer: Self-pay | Admitting: Osteopathic Medicine

## 2020-04-17 ENCOUNTER — Other Ambulatory Visit: Payer: Self-pay | Admitting: Family Medicine

## 2020-04-19 ENCOUNTER — Other Ambulatory Visit: Payer: Self-pay | Admitting: Internal Medicine

## 2020-04-19 DIAGNOSIS — E1159 Type 2 diabetes mellitus with other circulatory complications: Secondary | ICD-10-CM

## 2020-04-21 ENCOUNTER — Other Ambulatory Visit: Payer: Self-pay | Admitting: Osteopathic Medicine

## 2020-04-24 ENCOUNTER — Encounter: Payer: Self-pay | Admitting: Nurse Practitioner

## 2020-04-24 ENCOUNTER — Telehealth (INDEPENDENT_AMBULATORY_CARE_PROVIDER_SITE_OTHER): Payer: 59 | Admitting: Nurse Practitioner

## 2020-04-24 ENCOUNTER — Telehealth: Payer: 59 | Admitting: Family

## 2020-04-24 VITALS — Ht 71.0 in | Wt 387.0 lb

## 2020-04-24 DIAGNOSIS — M25559 Pain in unspecified hip: Secondary | ICD-10-CM | POA: Diagnosis not present

## 2020-04-24 DIAGNOSIS — I10 Essential (primary) hypertension: Secondary | ICD-10-CM

## 2020-04-24 DIAGNOSIS — M48061 Spinal stenosis, lumbar region without neurogenic claudication: Secondary | ICD-10-CM | POA: Diagnosis not present

## 2020-04-24 DIAGNOSIS — E1169 Type 2 diabetes mellitus with other specified complication: Secondary | ICD-10-CM | POA: Diagnosis not present

## 2020-04-24 DIAGNOSIS — E785 Hyperlipidemia, unspecified: Secondary | ICD-10-CM | POA: Diagnosis not present

## 2020-04-24 DIAGNOSIS — N529 Male erectile dysfunction, unspecified: Secondary | ICD-10-CM

## 2020-04-24 DIAGNOSIS — M5416 Radiculopathy, lumbar region: Secondary | ICD-10-CM | POA: Diagnosis not present

## 2020-04-24 DIAGNOSIS — R600 Localized edema: Secondary | ICD-10-CM

## 2020-04-24 DIAGNOSIS — G4733 Obstructive sleep apnea (adult) (pediatric): Secondary | ICD-10-CM

## 2020-04-24 DIAGNOSIS — M545 Low back pain, unspecified: Secondary | ICD-10-CM

## 2020-04-24 MED ORDER — SILDENAFIL CITRATE 100 MG PO TABS: ORAL_TABLET | ORAL | 0 refills | Status: DC

## 2020-04-24 MED ORDER — DULOXETINE HCL 60 MG PO CPEP: ORAL_CAPSULE | ORAL | 1 refills | Status: DC

## 2020-04-24 MED ORDER — LISINOPRIL-HYDROCHLOROTHIAZIDE 20-25 MG PO TABS: 1.0000 | ORAL_TABLET | Freq: Every day | ORAL | 1 refills | Status: DC

## 2020-04-24 MED ORDER — FUROSEMIDE 20 MG PO TABS: ORAL_TABLET | ORAL | 1 refills | Status: DC

## 2020-04-24 MED ORDER — ATORVASTATIN CALCIUM 20 MG PO TABS: 20.0000 mg | ORAL_TABLET | ORAL | 5 refills | Status: DC

## 2020-04-24 NOTE — Progress Notes (Signed)
Virtual Visit via Video Note  I connected with@ on 04/24/20 at  1:00 PM EST by a video enabled telemedicine application and verified that I am speaking with the correct person using two identifiers.  Location: Patient:Home Provider: Office Participants: patient and provider  I discussed the limitations of evaluation and management by telemedicine and the availability of in person appointments. I also discussed with the patient that there may be a patient responsible charge related to this service. The patient expressed understanding and agreed to proceed.  QP:[id number] refill  History of Present Illness: Hypertension Unable to provide BP reading today current use of lisinopril/HCTZ Maintains DASH diet Reviewed CMP completed by endocrinology 03/23/2020: stable renal function and electrolytes. Chronic LE edema, managed with furosemide 10mg  5x/week. Reports he is complaint with CPAP. Echocardiogram completed 2020: normal EF, no LVH.  BP Readings from Last 3 Encounters:  03/23/20 (!) 142/92  08/10/19 138/86  06/29/19 118/80   Maintain current maedication  Hyperlipidemia associated with type 2 diabetes mellitus ([place]) ASCVD risk of 26.2% Advised him about the pros and cons of statin regimen, and possible side effects Lipid Panel     Component Value Date/Time   CHOL 172 03/23/2020 1649   TRIG 86.0 03/23/2020 1649   HDL 54.40 03/23/2020 1649   CHOLHDL 3 03/23/2020 1649   VLDL 17.2 03/23/2020 1649   LDLCALC 100 (H) 03/23/2020 1649   LDLCALC 88 08/07/2018 1536   He agreed to start atorvastatin 20mg  3x/week. New rx sent F/up in 34months, need to repeat lipid panel, hepatic panel and CK.  Hip pain Chronic, due to severe OA. Not a surgical candidate at this time due to weight and uncontrolled DM. He reports he has completed sessions at a weight loss clinic and with a nutritionist in the past, hence does not need another appt. Pain is stable with cymbalta daily  Wt Readings  from Last 3 Encounters:  04/24/20 (!) 387 lb (175.5 kg)  03/23/20 (!) 390 lb 3.2 oz (177 kg)  08/10/19 (!) 384 lb (174.2 kg)   Observations/Objective: Physical Exam Constitutional:      General: He is not in acute distress. Pulmonary:     Effort: Pulmonary effort is normal.  Musculoskeletal:     Right lower leg: Edema present.     Left lower leg: Edema present.  Neurological:     Mental Status: He is alert and oriented to person, place, and time.  Psychiatric:        Mood and Affect: Mood normal.        Behavior: Behavior normal.        Thought Content: Thought content normal.    Assessment and Plan: Cowen was seen today for follow-up.  Diagnoses and all orders for this visit:  Primary hypertension -     lisinopril-hydrochlorothiazide (ZESTORETIC) 20-25 MG tablet; Take 1 tablet by mouth daily. -     atorvastatin (LIPITOR) 20 MG tablet; Take 1 tablet (20 mg total) by mouth 3 (three) times a week.  ERECTILE DYSFUNCTION, ORGANIC -     sildenafil (VIAGRA) 100 MG tablet; Take 0.5-1 tab 30 minutes prior to sexual activity as needed.  Spinal stenosis of lumbar region with radiculopathy -     DULoxetine (CYMBALTA) 60 MG capsule; TAKE 1 CAPSULE(60 MG) BY MOUTH DAILY  Hip pain -     DULoxetine (CYMBALTA) 60 MG capsule; TAKE 1 CAPSULE(60 MG) BY MOUTH DAILY  Bilateral leg edema -     furosemide (LASIX) 20 MG tablet; Take 10mg  on  M, W, Fri, Sat, and Sun.  OSA (obstructive sleep apnea) -     Ambulatory referral to Pulmonology  Hyperlipidemia associated with type 2 diabetes mellitus ([place]) -     atorvastatin (LIPITOR) 20 MG tablet; Take 1 tablet (20 mg total) by mouth 3 (three) times a week.   Follow Up Instructions: Maintain current medications F/up in 28months Send BP, pulse and weight via mychart.   I discussed the assessment and treatment plan with the patient. The patient was provided an opportunity to ask questions and all were answered. The patient agreed with the plan  and demonstrated an understanding of the instructions.   The patient was advised to call back or seek an in-person evaluation if the symptoms worsen or if the condition fails to improve as anticipated.  Wilfred Lacy, NP

## 2020-04-24 NOTE — Assessment & Plan Note (Signed)
Chronic, due to severe OA. Not a surgical candidate at this time due to weight and uncontrolled DM. He reports he has completed sessions at a weight loss clinic and with a nutritionist in the past, hence does not need another appt. Pain is stable with cymbalta daily

## 2020-04-24 NOTE — Assessment & Plan Note (Signed)
ASCVD risk of 26.2% Advised him about the pros and cons of statin regimen, and possible side effects Lipid Panel     Component Value Date/Time   CHOL 172 03/23/2020 1649   TRIG 86.0 03/23/2020 1649   HDL 54.40 03/23/2020 1649   CHOLHDL 3 03/23/2020 1649   VLDL 17.2 03/23/2020 1649   LDLCALC 100 (H) 03/23/2020 1649   LDLCALC 88 08/07/2018 1536   He agreed to start atorvastatin 20mg  3x/week. New rx sent F/up in 38months, need to repeat lipid panel, hepatic panel and CK.

## 2020-04-24 NOTE — Assessment & Plan Note (Addendum)
Unable to provide BP reading today current use of lisinopril/HCTZ Maintains DASH diet Reviewed CMP completed by endocrinology 03/23/2020: stable renal function and electrolytes. Chronic LE edema, managed with furosemide 10mg  5x/week. Reports he is complaint with CPAP. Echocardiogram completed 2020: normal EF, no LVH.  BP Readings from Last 3 Encounters:  03/23/20 (!) 142/92  08/10/19 138/86  06/29/19 118/80   Maintain current maedication

## 2020-04-24 NOTE — Progress Notes (Signed)
Hello, Do you need treatment for acute back pain or your medications refilled? If you need your medications, please reach out to your PCP first. I can send in a 30 days until you can get in with your provider.   Evelina Dun, FNP

## 2020-04-24 NOTE — Patient Instructions (Signed)
Maintain current medications F/up in 97months Send BP, pulse and weight via mychart.

## 2020-06-06 ENCOUNTER — Ambulatory Visit: Payer: 59

## 2020-06-17 ENCOUNTER — Other Ambulatory Visit: Payer: Self-pay | Admitting: Internal Medicine

## 2020-06-17 DIAGNOSIS — E1159 Type 2 diabetes mellitus with other circulatory complications: Secondary | ICD-10-CM

## 2020-06-17 DIAGNOSIS — N521 Erectile dysfunction due to diseases classified elsewhere: Secondary | ICD-10-CM

## 2020-06-21 ENCOUNTER — Other Ambulatory Visit: Payer: Self-pay | Admitting: Internal Medicine

## 2020-06-21 ENCOUNTER — Other Ambulatory Visit: Payer: Self-pay | Admitting: Family Medicine

## 2020-06-28 ENCOUNTER — Other Ambulatory Visit: Payer: Self-pay

## 2020-06-29 ENCOUNTER — Ambulatory Visit (INDEPENDENT_AMBULATORY_CARE_PROVIDER_SITE_OTHER): Payer: 59 | Admitting: Nurse Practitioner

## 2020-06-29 ENCOUNTER — Encounter: Payer: Self-pay | Admitting: Nurse Practitioner

## 2020-06-29 VITALS — BP 140/80 | HR 97 | Temp 96.7°F | Ht 70.0 in | Wt 383.8 lb

## 2020-06-29 DIAGNOSIS — Z125 Encounter for screening for malignant neoplasm of prostate: Secondary | ICD-10-CM

## 2020-06-29 DIAGNOSIS — E785 Hyperlipidemia, unspecified: Secondary | ICD-10-CM | POA: Diagnosis not present

## 2020-06-29 DIAGNOSIS — M160 Bilateral primary osteoarthritis of hip: Secondary | ICD-10-CM

## 2020-06-29 DIAGNOSIS — I10 Essential (primary) hypertension: Secondary | ICD-10-CM

## 2020-06-29 DIAGNOSIS — E1169 Type 2 diabetes mellitus with other specified complication: Secondary | ICD-10-CM

## 2020-06-29 DIAGNOSIS — Z0001 Encounter for general adult medical examination with abnormal findings: Secondary | ICD-10-CM

## 2020-06-29 LAB — COMPREHENSIVE METABOLIC PANEL
ALT: 23 U/L (ref 0–53)
AST: 17 U/L (ref 0–37)
Albumin: 4.4 g/dL (ref 3.5–5.2)
Alkaline Phosphatase: 71 U/L (ref 39–117)
BUN: 18 mg/dL (ref 6–23)
CO2: 30 mEq/L (ref 19–32)
Calcium: 10.2 mg/dL (ref 8.4–10.5)
Chloride: 95 mEq/L — ABNORMAL LOW (ref 96–112)
Creatinine, Ser: 1.1 mg/dL (ref 0.40–1.50)
GFR: 72.72 mL/min (ref >60.00)
Glucose, Bld: 189 mg/dL — ABNORMAL HIGH (ref 70–99)
Potassium: 4 mEq/L (ref 3.5–5.1)
Sodium: 134 mEq/L — ABNORMAL LOW (ref 135–145)
Total Bilirubin: 0.5 mg/dL (ref 0.2–1.2)
Total Protein: 7.5 g/dL (ref 6.0–8.3)

## 2020-06-29 LAB — LIPID PANEL
Cholesterol: 131 mg/dL (ref 0–200)
HDL: 51.5 mg/dL (ref >39.00)
LDL Cholesterol: 57 mg/dL (ref 0–99)
NonHDL: 79.85
Total CHOL/HDL Ratio: 3
Triglycerides: 115 mg/dL (ref 0.0–149.0)
VLDL: 23 mg/dL (ref 0.0–40.0)

## 2020-06-29 LAB — PSA: PSA: 0.69 ng/mL (ref 0.10–4.00)

## 2020-06-29 NOTE — Assessment & Plan Note (Addendum)
Chronic pain, worse with weight bearing activity, ambulates with cane. eval by rheumatology in past, suggested OA over autoimmune related. Under care of ortho. Not a surgical candidate due to uncontrolled DM and morbib obesity. No improvement with epidural injection , use of tylenol and/or NSAIDs He is not interested in use of opioids at this time. He plans to schedule appt with pain clinic to discuss other methods. He is also considering use of acupuncture.  Advised to try chair exercises, water aerobic and/or massage therapy.
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
## 2020-06-29 NOTE — Patient Instructions (Signed)
Go to lab for blood draw.

## 2020-06-29 NOTE — Assessment & Plan Note (Addendum)
Stable BP with lisinopril/hctz. BP Readings from Last 3 Encounters:  06/29/20 140/80  03/23/20 (!) 142/92  08/10/19 138/86   Repeat CMP Maintain current medication

## 2020-06-29 NOTE — Assessment & Plan Note (Signed)
Repeat lipid panel and CMP today Continue atorvastatin

## 2020-06-30 ENCOUNTER — Encounter: Payer: Self-pay | Admitting: Nurse Practitioner

## 2020-07-20 ENCOUNTER — Other Ambulatory Visit: Payer: Self-pay | Admitting: Internal Medicine

## 2020-07-27 ENCOUNTER — Ambulatory Visit: Payer: 59 | Admitting: Internal Medicine

## 2020-08-02 ENCOUNTER — Telehealth: Payer: Self-pay | Admitting: Internal Medicine

## 2020-08-02 NOTE — Telephone Encounter (Signed)
Patient's sister called to advise that the Ozempic is $1000 and patient is in between insurance.  Patient is requesting samples of Ozempic in order to bridge the gap until insurance issues are resolved.  They did go to the pharmacy website to get a savings card and without commercial insurance unable to obtain.  Call back number is [phone] [name] [place])

## 2020-08-02 NOTE — Telephone Encounter (Signed)
Called and left a message for pt advising we have a sample box of Ozempic available and we can print an application for Novo Nordisk to be completed and brought back to the office that will help with the cost of Ozempic.

## 2020-08-03 NOTE — Telephone Encounter (Signed)
Pt called to let us know they will be by tomorrow to pick it up

## 2020-08-25 ENCOUNTER — Other Ambulatory Visit: Payer: Self-pay | Admitting: Nurse Practitioner

## 2020-08-25 DIAGNOSIS — R600 Localized edema: Secondary | ICD-10-CM

## 2020-08-28 ENCOUNTER — Telehealth: Payer: Self-pay | Admitting: Internal Medicine

## 2020-08-28 NOTE — Telephone Encounter (Signed)
Came and dropped off NovoNordisk paperwork - placed in Dr. Arman Filter box

## 2020-08-29 NOTE — Telephone Encounter (Signed)
Paperwork received and completed. Faxed to [employer] 08/29/2020

## 2020-09-27 ENCOUNTER — Other Ambulatory Visit: Payer: Self-pay | Admitting: Internal Medicine

## 2020-09-29 ENCOUNTER — Ambulatory Visit: Payer: Self-pay | Admitting: Internal Medicine

## 2020-10-18 ENCOUNTER — Other Ambulatory Visit: Payer: Self-pay | Admitting: Internal Medicine

## 2020-10-20 ENCOUNTER — Encounter: Payer: Self-pay | Admitting: Nurse Practitioner

## 2020-11-03 ENCOUNTER — Other Ambulatory Visit: Payer: Self-pay | Admitting: Nurse Practitioner

## 2020-11-03 DIAGNOSIS — M7918 Myalgia, other site: Secondary | ICD-10-CM | POA: Diagnosis not present

## 2020-11-03 DIAGNOSIS — M48062 Spinal stenosis, lumbar region with neurogenic claudication: Secondary | ICD-10-CM | POA: Diagnosis not present

## 2020-11-03 DIAGNOSIS — I10 Essential (primary) hypertension: Secondary | ICD-10-CM

## 2020-11-03 DIAGNOSIS — M160 Bilateral primary osteoarthritis of hip: Secondary | ICD-10-CM | POA: Diagnosis not present

## 2020-11-07 ENCOUNTER — Other Ambulatory Visit: Payer: Self-pay | Admitting: Internal Medicine

## 2020-11-07 ENCOUNTER — Other Ambulatory Visit: Payer: Self-pay | Admitting: Nurse Practitioner

## 2020-11-07 ENCOUNTER — Telehealth: Payer: Self-pay | Admitting: Internal Medicine

## 2020-11-07 DIAGNOSIS — E1159 Type 2 diabetes mellitus with other circulatory complications: Secondary | ICD-10-CM

## 2020-11-07 DIAGNOSIS — N521 Erectile dysfunction due to diseases classified elsewhere: Secondary | ICD-10-CM

## 2020-11-07 DIAGNOSIS — I10 Essential (primary) hypertension: Secondary | ICD-10-CM

## 2020-11-07 MED ORDER — OZEMPIC (0.25 OR 0.5 MG/DOSE) 2 MG/1.5ML ~~LOC~~ SOPN: 0.5000 mg | PEN_INJECTOR | SUBCUTANEOUS | 0 refills | Status: DC

## 2020-11-07 NOTE — Telephone Encounter (Signed)
MEDICATION: Ozempic   PHARMACY:  [facility] on [address]  HAS THE PATIENT CONTACTED THEIR PHARMACY?  Yes, was told by pharmacy that the RX was sent electronically  Out completely  LAST APPOINTMENT DATE: @5 /31/2022  NEXT APPOINTMENT DATE:@6 /11/2020

## 2020-11-07 NOTE — Telephone Encounter (Signed)
Rx sent to preferred pharmacy.

## 2020-11-10 ENCOUNTER — Encounter: Payer: Self-pay | Admitting: Internal Medicine

## 2020-11-10 ENCOUNTER — Telehealth: Payer: Self-pay | Admitting: Nurse Practitioner

## 2020-11-10 ENCOUNTER — Telehealth: Payer: Self-pay | Admitting: Internal Medicine

## 2020-11-10 DIAGNOSIS — I10 Essential (primary) hypertension: Secondary | ICD-10-CM

## 2020-11-10 MED ORDER — LISINOPRIL-HYDROCHLOROTHIAZIDE 20-25 MG PO TABS: 1.0000 | ORAL_TABLET | Freq: Every day | ORAL | 1 refills | Status: DC

## 2020-11-10 NOTE — Telephone Encounter (Signed)
What is the name of the medication? Brent Wallace (ZESTORETIC) 20-25 MG tablet [[id number]]    Have you contacted your pharmacy to request a refill? Yes, he is needing a new script. He is completely out of pills.   Which pharmacy would you like this sent to? Pharmacy  [id number] - [address], [address] AT [address]  [place], [place] [address]  Phone:  [phone] Fax:  [phone]  DEA #:  [id number]      Patient notified that their request is being sent to the clinical staff for review and that they should receive a call once it is complete. If they do not receive a call within 72 hours they can check with their pharmacy or our office.

## 2020-11-10 NOTE — Telephone Encounter (Signed)
Chart supports Rx and medication sent to pharmacy

## 2020-11-10 NOTE — Telephone Encounter (Signed)
Called and advised pt unable to submit PA, missing up to date labs. Pt advised PA will be submitted after scheduled visit 11/13/20

## 2020-11-10 NOTE — Telephone Encounter (Signed)
Patient;s wife [name] called re: Per new [employer] insurance-RX for [employer] requires PA.

## 2020-11-13 ENCOUNTER — Encounter: Payer: Self-pay | Admitting: Internal Medicine

## 2020-11-13 ENCOUNTER — Ambulatory Visit (INDEPENDENT_AMBULATORY_CARE_PROVIDER_SITE_OTHER): Payer: BC Managed Care – PPO | Admitting: Internal Medicine

## 2020-11-13 ENCOUNTER — Other Ambulatory Visit: Payer: Self-pay

## 2020-11-13 VITALS — BP 140/90 | HR 88 | Ht 70.0 in | Wt 391.4 lb

## 2020-11-13 DIAGNOSIS — E1159 Type 2 diabetes mellitus with other circulatory complications: Secondary | ICD-10-CM

## 2020-11-13 DIAGNOSIS — N521 Erectile dysfunction due to diseases classified elsewhere: Secondary | ICD-10-CM | POA: Diagnosis not present

## 2020-11-13 DIAGNOSIS — E785 Hyperlipidemia, unspecified: Secondary | ICD-10-CM

## 2020-11-13 DIAGNOSIS — E1169 Type 2 diabetes mellitus with other specified complication: Secondary | ICD-10-CM

## 2020-11-13 DIAGNOSIS — Z6841 Body Mass Index (BMI) 40.0 and over, adult: Secondary | ICD-10-CM

## 2020-11-13 LAB — POCT GLYCOSYLATED HEMOGLOBIN (HGB A1C): Hemoglobin A1C: 9.9 % — AB (ref 4.0–5.6)

## 2020-11-13 MED ORDER — OZEMPIC (1 MG/DOSE) 2 MG/1.5ML ~~LOC~~ SOPN: 1.0000 mg | PEN_INJECTOR | SUBCUTANEOUS | 3 refills | Status: DC

## 2020-11-13 NOTE — Patient Instructions (Addendum)
Please continue: - Metformin XR 500 mg 2x a day, with meals - Basaglar 38 units at bedtime - Novolog 20-34 units 15 min before meals   Please start: - Ozempic 1 mg weekly in a.m.  Please return in 4 months.
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
## 2020-11-15 ENCOUNTER — Other Ambulatory Visit: Payer: Self-pay | Admitting: Internal Medicine

## 2020-11-17 NOTE — Telephone Encounter (Signed)
Pt's wife calling to follow up on the PA for the ozempic.

## 2020-11-20 ENCOUNTER — Encounter: Payer: Self-pay | Admitting: Internal Medicine

## 2020-11-20 NOTE — Telephone Encounter (Addendum)
Patient Advocate Encounter   Received notification from [employer] that prior authorization for [facility] is required.   PA submitted on 11/20/2020 Key [id number] Status is APPROVED  PA#:  [id number] [facility] 11/20/2021    [facility] will continue to follow.   Venida Jarvis. Nadara Mustard, CPhT Patient Advocate [facility] Phone: [phone] Fax:  [phone]

## 2020-11-30 ENCOUNTER — Other Ambulatory Visit: Payer: Self-pay | Admitting: Nurse Practitioner

## 2020-11-30 DIAGNOSIS — M25559 Pain in unspecified hip: Secondary | ICD-10-CM

## 2020-11-30 DIAGNOSIS — M5416 Radiculopathy, lumbar region: Secondary | ICD-10-CM

## 2020-11-30 DIAGNOSIS — M48061 Spinal stenosis, lumbar region without neurogenic claudication: Secondary | ICD-10-CM

## 2020-12-01 ENCOUNTER — Other Ambulatory Visit: Payer: Self-pay | Admitting: Internal Medicine

## 2020-12-18 DIAGNOSIS — E119 Type 2 diabetes mellitus without complications: Secondary | ICD-10-CM | POA: Diagnosis not present

## 2020-12-18 DIAGNOSIS — H469 Unspecified optic neuritis: Secondary | ICD-10-CM | POA: Diagnosis not present

## 2020-12-18 DIAGNOSIS — H524 Presbyopia: Secondary | ICD-10-CM | POA: Diagnosis not present

## 2020-12-18 LAB — HM DIABETES EYE EXAM

## 2020-12-22 DIAGNOSIS — G8929 Other chronic pain: Secondary | ICD-10-CM | POA: Diagnosis not present

## 2020-12-22 DIAGNOSIS — M160 Bilateral primary osteoarthritis of hip: Secondary | ICD-10-CM | POA: Diagnosis not present

## 2020-12-22 DIAGNOSIS — M1612 Unilateral primary osteoarthritis, left hip: Secondary | ICD-10-CM | POA: Diagnosis not present

## 2020-12-28 ENCOUNTER — Ambulatory Visit: Payer: 59 | Admitting: Nurse Practitioner

## 2020-12-28 ENCOUNTER — Telehealth (INDEPENDENT_AMBULATORY_CARE_PROVIDER_SITE_OTHER): Payer: BC Managed Care – PPO | Admitting: Nurse Practitioner

## 2020-12-28 ENCOUNTER — Encounter: Payer: Self-pay | Admitting: Nurse Practitioner

## 2020-12-28 VITALS — Ht 70.0 in | Wt 379.0 lb

## 2020-12-28 DIAGNOSIS — I10 Essential (primary) hypertension: Secondary | ICD-10-CM

## 2020-12-28 MED ORDER — CARVEDILOL 3.125 MG PO TABS: 3.1250 mg | ORAL_TABLET | Freq: Two times a day (BID) | ORAL | 5 refills | Status: DC

## 2020-12-28 NOTE — Progress Notes (Signed)
Virtual Visit via Video Note  I connected withNAME@ on 12/28/20 at  1:00 PM EDT by a video enabled telemedicine application and verified that I am speaking with the correct person using two identifiers.  Location: Patient:Home Provider: Office Participants: patient and provider  I discussed the limitations of evaluation and management by telemedicine and the availability of in person appointments. I also discussed with the patient that there may be a patient responsible charge related to this service. The patient expressed understanding and agreed to proceed.  CC:HTN f/up  History of Present Illness: Hypertension BP not at goal based on readings documented by endocrinology and surgeon. Echocardiogram completed 2020: normal.  BP Readings from Last 3 Encounters:  11/13/20 140/90  06/29/20 140/80  03/23/20 (!) 142/92   Repeat BMP Continue lisinopril/HCTZ and furosemide Add coreg 3.125mg  BID F/up in 37month  Wt Readings from Last 3 Encounters:  12/28/20 (!) 379 lb (171.9 kg)  11/13/20 (!) 391 lb 6.4 oz (177.5 kg)  06/29/20 (!) 383 lb 12.8 oz (174.1 kg)     Observations/Objective: Physical Exam Constitutional:      Appearance: He is obese.  Pulmonary:     Effort: Pulmonary effort is normal.  Neurological:     Mental Status: He is alert and oriented to person, place, and time.   Limited due to video appt  Assessment and Plan: Brent Wallace was seen today for follow-up.  Diagnoses and all orders for this visit:  Primary hypertension -     carvedilol (COREG) 3.125 MG tablet; Take 1 tablet (3.125 mg total) by mouth 2 (two) times daily with a meal. -     Basic metabolic panel; Future   Follow Up Instructions: See above Go to lab for blood draw on 01/01/2021 at 11:20am   I discussed the assessment and treatment plan with the patient. The patient was provided an opportunity to ask questions and all were answered. The patient agreed with the plan and demonstrated an understanding  of the instructions.   The patient was advised to call back or seek an in-person evaluation if the symptoms worsen or if the condition fails to improve as anticipated.  Wilfred Lacy, NP

## 2020-12-28 NOTE — Assessment & Plan Note (Signed)
BP not at goal based on readings documented by endocrinology and surgeon. Echocardiogram completed 2020: normal.  BP Readings from Last 3 Encounters:  11/13/20 140/90  06/29/20 140/80  03/23/20 (!) 142/92   Repeat BMP Continue lisinopril/HCTZ and furosemide Add coreg 3.125mg  BID F/up in 74month

## 2021-01-01 ENCOUNTER — Other Ambulatory Visit: Payer: Self-pay

## 2021-01-01 ENCOUNTER — Other Ambulatory Visit (INDEPENDENT_AMBULATORY_CARE_PROVIDER_SITE_OTHER): Payer: BC Managed Care – PPO

## 2021-01-01 DIAGNOSIS — I10 Essential (primary) hypertension: Secondary | ICD-10-CM | POA: Diagnosis not present

## 2021-01-01 NOTE — Progress Notes (Signed)
Per the orders of NP Wilfred Lacy pt is here for labs, pt tolerated draw well.

## 2021-01-02 LAB — BASIC METABOLIC PANEL
BUN: 15 mg/dL (ref 6–23)
CO2: 31 mEq/L (ref 19–32)
Calcium: 10.2 mg/dL (ref 8.4–10.5)
Chloride: 93 mEq/L — ABNORMAL LOW (ref 96–112)
Creatinine, Ser: 1.03 mg/dL (ref 0.40–1.50)
GFR: 78.41 mL/min (ref >60.00)
Glucose, Bld: 206 mg/dL — ABNORMAL HIGH (ref 70–99)
Potassium: 4.2 mEq/L (ref 3.5–5.1)
Sodium: 135 mEq/L (ref 135–145)

## 2021-01-05 ENCOUNTER — Telehealth: Payer: Self-pay | Admitting: Nurse Practitioner

## 2021-01-05 NOTE — Telephone Encounter (Signed)
Tried to call pt to make aware that forms that were dropped off are ready for pickup. The form is in folder up front

## 2021-01-16 ENCOUNTER — Other Ambulatory Visit: Payer: Self-pay | Admitting: Internal Medicine

## 2021-01-26 ENCOUNTER — Telehealth: Payer: Self-pay | Admitting: Internal Medicine

## 2021-01-26 NOTE — Telephone Encounter (Signed)
Patient's wife [name] called re: PHARM through [employer] told Maudie Mercury Flexpen requires a PA to be initiated through [employer] or [employer] My [employer] online

## 2021-01-29 ENCOUNTER — Other Ambulatory Visit: Payer: Self-pay | Admitting: Nurse Practitioner

## 2021-01-29 DIAGNOSIS — E785 Hyperlipidemia, unspecified: Secondary | ICD-10-CM

## 2021-01-29 DIAGNOSIS — E1169 Type 2 diabetes mellitus with other specified complication: Secondary | ICD-10-CM

## 2021-01-29 DIAGNOSIS — I10 Essential (primary) hypertension: Secondary | ICD-10-CM

## 2021-01-30 ENCOUNTER — Other Ambulatory Visit (HOSPITAL_COMMUNITY): Payer: Self-pay

## 2021-01-30 ENCOUNTER — Other Ambulatory Visit: Payer: Self-pay | Admitting: Internal Medicine

## 2021-01-30 ENCOUNTER — Telehealth: Payer: Self-pay | Admitting: Pharmacy Technician

## 2021-01-30 DIAGNOSIS — E1159 Type 2 diabetes mellitus with other circulatory complications: Secondary | ICD-10-CM

## 2021-01-30 MED ORDER — LANTUS SOLOSTAR 100 UNIT/ML ~~LOC~~ SOPN: 38.0000 [IU] | PEN_INJECTOR | Freq: Every day | SUBCUTANEOUS | 3 refills | Status: DC

## 2021-01-30 NOTE — Addendum Note (Signed)
Addended by: Lauralyn Primes on: 01/30/2021 04:55 PM   Modules accepted: Orders

## 2021-01-30 NOTE — Telephone Encounter (Signed)
Pt advocate notified to initiate PA for Aloha. Awaiting determination.

## 2021-01-30 NOTE — Telephone Encounter (Signed)
Patient Advocate Encounter   Received notification from [employer] that prior authorization for Nancee Liter is required.   Name brand Lantus is preferred. Messaged T[facility] and Dr. Cruzita Lederer to see if they'd consider changing it.    [facility] will continue to follow:   Armanda Magic, CPhT Patient Advocate [facility] Phone: [phone] Fax:  [phone]

## 2021-01-31 NOTE — Telephone Encounter (Signed)
Chart supports rx refill Last ov: 12/28/2020 via video Last refill:12/31/2020

## 2021-02-01 DIAGNOSIS — M1612 Unilateral primary osteoarthritis, left hip: Secondary | ICD-10-CM | POA: Diagnosis not present

## 2021-02-01 DIAGNOSIS — G629 Polyneuropathy, unspecified: Secondary | ICD-10-CM | POA: Diagnosis not present

## 2021-02-01 DIAGNOSIS — M25552 Pain in left hip: Secondary | ICD-10-CM | POA: Diagnosis not present

## 2021-02-05 ENCOUNTER — Telehealth: Payer: Self-pay | Admitting: Pharmacy Technician

## 2021-02-05 NOTE — Telephone Encounter (Signed)
Patient Advocate Encounter   Left a [employer] compliant VM  If pt calls back Dr. Cruzita Lederer needs to know if the pt needs Lantus sent to a local pharmacy as well as the mail order pharmacy, [facility], which she already sent it to. Basaglar is nto covered by his insurance, but Lantus is.   Armanda Magic, CPhT Patient Advocate [facility] Phone: [phone] Fax:  [phone]

## 2021-02-09 ENCOUNTER — Telehealth: Payer: Self-pay | Admitting: Internal Medicine

## 2021-02-09 ENCOUNTER — Other Ambulatory Visit: Payer: Self-pay

## 2021-02-09 DIAGNOSIS — E1159 Type 2 diabetes mellitus with other circulatory complications: Secondary | ICD-10-CM

## 2021-02-09 DIAGNOSIS — N521 Erectile dysfunction due to diseases classified elsewhere: Secondary | ICD-10-CM

## 2021-02-09 MED ORDER — LANTUS SOLOSTAR 100 UNIT/ML ~~LOC~~ SOPN: 38.0000 [IU] | PEN_INJECTOR | Freq: Every day | SUBCUTANEOUS | 0 refills | Status: DC

## 2021-02-09 NOTE — Telephone Encounter (Signed)
Sent to [facility]

## 2021-02-09 NOTE — Telephone Encounter (Signed)
Pt's wife is stating that they do not receive lantus at [facility] RX lantus needs to be sent to     insulin glargine (LANTUS SOLOSTAR) 100 UNIT/ML Solostar Pen [id number] - [place], [place] - [address] AT [address]    He has one half of a pen left.

## 2021-02-11 ENCOUNTER — Other Ambulatory Visit: Payer: Self-pay | Admitting: Nurse Practitioner

## 2021-02-11 DIAGNOSIS — R600 Localized edema: Secondary | ICD-10-CM

## 2021-02-13 NOTE — Telephone Encounter (Signed)
Chart supports refill Last ov 06/29/2020 Last refill 11/20/2020

## 2021-02-23 ENCOUNTER — Encounter: Payer: Self-pay | Admitting: Internal Medicine

## 2021-02-23 ENCOUNTER — Encounter: Payer: Self-pay | Admitting: Nurse Practitioner

## 2021-02-23 DIAGNOSIS — E1159 Type 2 diabetes mellitus with other circulatory complications: Secondary | ICD-10-CM

## 2021-03-06 DIAGNOSIS — M1612 Unilateral primary osteoarthritis, left hip: Secondary | ICD-10-CM | POA: Diagnosis not present

## 2021-03-06 DIAGNOSIS — M160 Bilateral primary osteoarthritis of hip: Secondary | ICD-10-CM | POA: Diagnosis not present

## 2021-03-16 ENCOUNTER — Telehealth: Payer: Self-pay

## 2021-03-16 ENCOUNTER — Telehealth: Payer: Self-pay | Admitting: Pharmacy Technician

## 2021-03-16 ENCOUNTER — Other Ambulatory Visit: Payer: Self-pay | Admitting: Nurse Practitioner

## 2021-03-16 DIAGNOSIS — E1169 Type 2 diabetes mellitus with other specified complication: Secondary | ICD-10-CM

## 2021-03-16 DIAGNOSIS — E785 Hyperlipidemia, unspecified: Secondary | ICD-10-CM

## 2021-03-16 DIAGNOSIS — M48061 Spinal stenosis, lumbar region without neurogenic claudication: Secondary | ICD-10-CM

## 2021-03-16 DIAGNOSIS — I10 Essential (primary) hypertension: Secondary | ICD-10-CM

## 2021-03-16 DIAGNOSIS — M25559 Pain in unspecified hip: Secondary | ICD-10-CM

## 2021-03-16 DIAGNOSIS — M5416 Radiculopathy, lumbar region: Secondary | ICD-10-CM

## 2021-03-16 MED ORDER — LANTUS SOLOSTAR 100 UNIT/ML ~~LOC~~ SOPN: 34.0000 [IU] | PEN_INJECTOR | Freq: Every day | SUBCUTANEOUS | 99 refills | Status: DC

## 2021-03-16 MED ORDER — ATORVASTATIN CALCIUM 20 MG PO TABS
20.0000 mg | ORAL_TABLET | ORAL | 4 refills | Status: DC
Start: 2021-03-16 — End: 2021-07-31

## 2021-03-16 MED ORDER — METFORMIN HCL ER 500 MG PO TB24: 500.0000 mg | ORAL_TABLET | Freq: Two times a day (BID) | ORAL | 2 refills | Status: DC

## 2021-03-16 MED ORDER — BD PEN NEEDLE NANO 2ND GEN 32G X 4 MM MISC: 5 refills | Status: DC

## 2021-03-16 MED ORDER — INSULIN ASPART FLEXPEN 100 UNIT/ML ~~LOC~~ SOPN: PEN_INJECTOR | SUBCUTANEOUS | 1 refills | Status: DC

## 2021-03-16 MED ORDER — LISINOPRIL-HYDROCHLOROTHIAZIDE 20-25 MG PO TABS
1.0000 | ORAL_TABLET | Freq: Every day | ORAL | 1 refills | Status: DC
Start: 2021-03-16 — End: 2021-07-31

## 2021-03-16 NOTE — Telephone Encounter (Signed)
Received notification from [facility] regarding a prior authorization for Brent Wallace. Authorization has been APPROVED from 03/16/21 to 03/16/22.   Authorization #  [id number]

## 2021-03-16 NOTE — Telephone Encounter (Signed)
Patient has switched pharmacy to [facility] and would like medications and refills sent to new pharmacy.

## 2021-03-16 NOTE — Telephone Encounter (Signed)
Pt needs refills sent to new pharmacy per insurance.

## 2021-03-16 NOTE — Telephone Encounter (Signed)
[facility] fax receiving for patient requesting transfer of medications of Novolog, Lantus and pen needles. Meds have been sent to new pharmacy ([place])

## 2021-03-16 NOTE — Telephone Encounter (Signed)
Patient Advocate Encounter   Received notification from [employer] that prior authorization for NOVOLOG is required.   PA submitted on 03/16/21 Key [id number] Status is pending    [facility] will continue to follow   Burney Gauze, CPhT Patient [facility] Phone: [phone] Fax:  [phone]

## 2021-03-19 ENCOUNTER — Encounter: Payer: Self-pay | Admitting: Internal Medicine

## 2021-03-19 ENCOUNTER — Other Ambulatory Visit: Payer: Self-pay

## 2021-03-19 ENCOUNTER — Ambulatory Visit (INDEPENDENT_AMBULATORY_CARE_PROVIDER_SITE_OTHER): Payer: BC Managed Care – PPO | Admitting: Internal Medicine

## 2021-03-19 VITALS — BP 158/96 | HR 85 | Ht 70.0 in | Wt 382.6 lb

## 2021-03-19 DIAGNOSIS — Z6841 Body Mass Index (BMI) 40.0 and over, adult: Secondary | ICD-10-CM

## 2021-03-19 DIAGNOSIS — N521 Erectile dysfunction due to diseases classified elsewhere: Secondary | ICD-10-CM

## 2021-03-19 DIAGNOSIS — E1159 Type 2 diabetes mellitus with other circulatory complications: Secondary | ICD-10-CM

## 2021-03-19 DIAGNOSIS — E1169 Type 2 diabetes mellitus with other specified complication: Secondary | ICD-10-CM | POA: Diagnosis not present

## 2021-03-19 DIAGNOSIS — E785 Hyperlipidemia, unspecified: Secondary | ICD-10-CM

## 2021-03-19 LAB — POCT GLYCOSYLATED HEMOGLOBIN (HGB A1C): Hemoglobin A1C: 9.6 % — AB (ref 4.0–5.6)

## 2021-03-19 MED ORDER — INSULIN ASPART FLEXPEN 100 UNIT/ML ~~LOC~~ SOPN: PEN_INJECTOR | SUBCUTANEOUS | 3 refills | Status: DC

## 2021-03-19 MED ORDER — OZEMPIC (2 MG/DOSE) 8 MG/3ML ~~LOC~~ SOPN: 2.0000 mg | PEN_INJECTOR | SUBCUTANEOUS | 3 refills | Status: DC

## 2021-03-19 MED ORDER — LANTUS SOLOSTAR 100 UNIT/ML ~~LOC~~ SOPN: 38.0000 [IU] | PEN_INJECTOR | Freq: Every day | SUBCUTANEOUS | 3 refills | Status: DC

## 2021-03-19 NOTE — Patient Instructions (Signed)
Please continue: - Metformin XR 500 mg 2x a day, with meals - Lantus 38 units at bedtime - Novolog 20-34 units 15 min before meals   Please increase: - Ozempic to 2 mg weekly in a.m.   Please return in 3-4 months with your sugar log.
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
## 2021-03-23 ENCOUNTER — Encounter: Payer: Self-pay | Admitting: Nurse Practitioner

## 2021-03-23 DIAGNOSIS — M25559 Pain in unspecified hip: Secondary | ICD-10-CM

## 2021-03-23 DIAGNOSIS — M48061 Spinal stenosis, lumbar region without neurogenic claudication: Secondary | ICD-10-CM

## 2021-03-23 DIAGNOSIS — M5416 Radiculopathy, lumbar region: Secondary | ICD-10-CM

## 2021-03-26 MED ORDER — DULOXETINE HCL 60 MG PO CPEP: ORAL_CAPSULE | ORAL | 1 refills | Status: DC

## 2021-03-26 NOTE — Telephone Encounter (Signed)
Please review and advise on patients message.   Thanks.  Dm/cma

## 2021-04-23 ENCOUNTER — Telehealth: Payer: Self-pay | Admitting: Nurse Practitioner

## 2021-04-23 NOTE — Telephone Encounter (Signed)
Pt wife came in and said pt has a jurry summons for 05/23/21 at 8:15am and that he is not able to do it and that he needs a note stating that he cant. She stopped by showing me the letter and I made a copy and put in [place] folder up front. They requested a call at [phone] when its ready for pickup. Please advise. They need it prior to date of service

## 2021-04-24 DIAGNOSIS — M25551 Pain in right hip: Secondary | ICD-10-CM | POA: Diagnosis not present

## 2021-04-24 DIAGNOSIS — M1611 Unilateral primary osteoarthritis, right hip: Secondary | ICD-10-CM | POA: Diagnosis not present

## 2021-04-24 DIAGNOSIS — G629 Polyneuropathy, unspecified: Secondary | ICD-10-CM | POA: Diagnosis not present

## 2021-04-27 ENCOUNTER — Encounter: Payer: Self-pay | Admitting: Nurse Practitioner

## 2021-04-27 NOTE — Telephone Encounter (Signed)
Forms have been completed and placed up front for pick up. LVM on patient and wife phone informing them forms were ready.

## 2021-05-02 ENCOUNTER — Encounter: Payer: Self-pay | Admitting: Gastroenterology

## 2021-05-21 ENCOUNTER — Encounter: Payer: Self-pay | Admitting: Pulmonary Disease

## 2021-05-21 ENCOUNTER — Other Ambulatory Visit: Payer: Self-pay

## 2021-05-21 ENCOUNTER — Ambulatory Visit (INDEPENDENT_AMBULATORY_CARE_PROVIDER_SITE_OTHER): Payer: BC Managed Care – PPO | Admitting: Pulmonary Disease

## 2021-05-21 VITALS — BP 142/92 | HR 85 | Temp 98.2°F | Ht 70.0 in | Wt 376.0 lb

## 2021-05-21 DIAGNOSIS — I159 Secondary hypertension, unspecified: Secondary | ICD-10-CM

## 2021-05-21 DIAGNOSIS — G4733 Obstructive sleep apnea (adult) (pediatric): Secondary | ICD-10-CM

## 2021-05-21 MED ORDER — OLMESARTAN MEDOXOMIL-HCTZ 20-12.5 MG PO TABS: 1.0000 | ORAL_TABLET | Freq: Every day | ORAL | 0 refills | Status: DC

## 2021-05-21 NOTE — Patient Instructions (Signed)
  X Rx for autoCPAP 15-20 cm will be sent to [facility]  X  STOP taking lisinopril Instead Rx for Benicar - HCTZ 20-12.5   x 30 tabs  Check your BP on new medication & get with your PCP
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
## 2021-05-21 NOTE — Assessment & Plan Note (Signed)
He has good compliance by history on CPAP 17 cm with a full facemask and CPAP is certainly helped improve his daytime somnolence and fatigue. He has gained weight and by his wife's history may need increased pressure.  We will order a replacement CPAP for him and place her on auto settings 15 to 20 cm, review download and see if his pressure requirements have changed  Weight loss encouraged, compliance with goal of at least 4-6 hrs every night is the expectation. Advised against medications with sedative side effects Cautioned against driving when sleepy - understanding that sleepiness will vary on a day to day basis

## 2021-05-21 NOTE — Assessment & Plan Note (Signed)
His chronic cough and constant throat clearing may be a side effect of lisinopril and I will asked him to change to Benicar HCTZ, prescription was provided. He will keep tabs on his blood pressure and report back to his PCP for further refills

## 2021-06-08 ENCOUNTER — Other Ambulatory Visit: Payer: Self-pay | Admitting: Nurse Practitioner

## 2021-06-08 DIAGNOSIS — I10 Essential (primary) hypertension: Secondary | ICD-10-CM

## 2021-06-08 NOTE — Telephone Encounter (Signed)
Chart supports Rx Last seen 12/28/20 LVM informing patient to schedule appointment.

## 2021-06-14 ENCOUNTER — Other Ambulatory Visit: Payer: Self-pay | Admitting: Pulmonary Disease

## 2021-06-18 DIAGNOSIS — M160 Bilateral primary osteoarthritis of hip: Secondary | ICD-10-CM | POA: Diagnosis not present

## 2021-06-22 ENCOUNTER — Encounter: Payer: Self-pay | Admitting: Nurse Practitioner

## 2021-06-22 DIAGNOSIS — M48061 Spinal stenosis, lumbar region without neurogenic claudication: Secondary | ICD-10-CM

## 2021-06-22 DIAGNOSIS — M25559 Pain in unspecified hip: Secondary | ICD-10-CM

## 2021-06-22 DIAGNOSIS — M5416 Radiculopathy, lumbar region: Secondary | ICD-10-CM

## 2021-06-27 MED ORDER — DULOXETINE HCL 60 MG PO CPEP: 60.0000 mg | ORAL_CAPSULE | Freq: Two times a day (BID) | ORAL | 1 refills | Status: DC

## 2021-07-17 ENCOUNTER — Other Ambulatory Visit: Payer: Self-pay | Admitting: Pulmonary Disease

## 2021-07-23 ENCOUNTER — Encounter: Payer: Self-pay | Admitting: Internal Medicine

## 2021-07-23 ENCOUNTER — Other Ambulatory Visit: Payer: Self-pay

## 2021-07-23 ENCOUNTER — Ambulatory Visit (INDEPENDENT_AMBULATORY_CARE_PROVIDER_SITE_OTHER): Payer: BC Managed Care – PPO | Admitting: Internal Medicine

## 2021-07-23 VITALS — BP 140/88 | HR 91 | Ht 70.0 in | Wt 378.2 lb

## 2021-07-23 DIAGNOSIS — E785 Hyperlipidemia, unspecified: Secondary | ICD-10-CM

## 2021-07-23 DIAGNOSIS — Z6841 Body Mass Index (BMI) 40.0 and over, adult: Secondary | ICD-10-CM

## 2021-07-23 DIAGNOSIS — N521 Erectile dysfunction due to diseases classified elsewhere: Secondary | ICD-10-CM | POA: Diagnosis not present

## 2021-07-23 DIAGNOSIS — E1159 Type 2 diabetes mellitus with other circulatory complications: Secondary | ICD-10-CM

## 2021-07-23 DIAGNOSIS — E1169 Type 2 diabetes mellitus with other specified complication: Secondary | ICD-10-CM | POA: Diagnosis not present

## 2021-07-23 LAB — POCT GLYCOSYLATED HEMOGLOBIN (HGB A1C): Hemoglobin A1C: 8.5 % — AB (ref 4.0–5.6)

## 2021-07-23 MED ORDER — FREESTYLE LIBRE 3 SENSOR MISC: 1.0000 | 3 refills | Status: DC

## 2021-07-23 MED ORDER — OZEMPIC (2 MG/DOSE) 8 MG/3ML ~~LOC~~ SOPN: 2.0000 mg | PEN_INJECTOR | SUBCUTANEOUS | 3 refills | Status: DC

## 2021-07-23 NOTE — Patient Instructions (Addendum)
Please continue: - Metformin XR 500 mg 2x a day, with meals - Novolog 20-28 units 15 min before meals  - Ozempic 2 mg weekly in a.m.   Please return in 4 months.
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
## 2021-07-29 ENCOUNTER — Other Ambulatory Visit: Payer: Self-pay | Admitting: Nurse Practitioner

## 2021-07-29 DIAGNOSIS — I10 Essential (primary) hypertension: Secondary | ICD-10-CM

## 2021-07-29 DIAGNOSIS — E1169 Type 2 diabetes mellitus with other specified complication: Secondary | ICD-10-CM

## 2021-07-30 NOTE — Telephone Encounter (Signed)
Refill Request will be addressed at appointment on 07/31/2021.

## 2021-07-31 ENCOUNTER — Other Ambulatory Visit: Payer: Self-pay

## 2021-07-31 ENCOUNTER — Encounter: Payer: Self-pay | Admitting: Nurse Practitioner

## 2021-07-31 ENCOUNTER — Ambulatory Visit (INDEPENDENT_AMBULATORY_CARE_PROVIDER_SITE_OTHER): Payer: BC Managed Care – PPO | Admitting: Nurse Practitioner

## 2021-07-31 VITALS — BP 150/100 | HR 86 | Temp 97.0°F | Ht 69.0 in | Wt 377.0 lb

## 2021-07-31 DIAGNOSIS — Z87891 Personal history of nicotine dependence: Secondary | ICD-10-CM | POA: Diagnosis not present

## 2021-07-31 DIAGNOSIS — Z125 Encounter for screening for malignant neoplasm of prostate: Secondary | ICD-10-CM | POA: Diagnosis not present

## 2021-07-31 DIAGNOSIS — E1169 Type 2 diabetes mellitus with other specified complication: Secondary | ICD-10-CM

## 2021-07-31 DIAGNOSIS — E785 Hyperlipidemia, unspecified: Secondary | ICD-10-CM

## 2021-07-31 DIAGNOSIS — F339 Major depressive disorder, recurrent, unspecified: Secondary | ICD-10-CM

## 2021-07-31 DIAGNOSIS — R600 Localized edema: Secondary | ICD-10-CM

## 2021-07-31 DIAGNOSIS — M48061 Spinal stenosis, lumbar region without neurogenic claudication: Secondary | ICD-10-CM

## 2021-07-31 DIAGNOSIS — I10 Essential (primary) hypertension: Secondary | ICD-10-CM

## 2021-07-31 DIAGNOSIS — M25559 Pain in unspecified hip: Secondary | ICD-10-CM

## 2021-07-31 DIAGNOSIS — N521 Erectile dysfunction due to diseases classified elsewhere: Secondary | ICD-10-CM

## 2021-07-31 DIAGNOSIS — E1159 Type 2 diabetes mellitus with other circulatory complications: Secondary | ICD-10-CM

## 2021-07-31 DIAGNOSIS — M5416 Radiculopathy, lumbar region: Secondary | ICD-10-CM

## 2021-07-31 MED ORDER — OLMESARTAN MEDOXOMIL-HCTZ 40-12.5 MG PO TABS: 1.0000 | ORAL_TABLET | Freq: Every day | ORAL | 1 refills | Status: DC

## 2021-07-31 MED ORDER — ATORVASTATIN CALCIUM 20 MG PO TABS: 20.0000 mg | ORAL_TABLET | ORAL | 4 refills | Status: DC

## 2021-07-31 MED ORDER — GLUCOSE BLOOD VI STRP: 1.0000 | ORAL_STRIP | Freq: Three times a day (TID) | 12 refills | Status: DC

## 2021-07-31 NOTE — Patient Instructions (Addendum)
Go to lab for blood draw and urine collection.  DASH Eating Plan DASH stands for Dietary Approaches to Stop Hypertension. The DASH eating plan is a healthy eating plan that has been shown to: Reduce high blood pressure (hypertension). Reduce your risk for type 2 diabetes, heart disease, and stroke. Help with weight loss. What are tips for following this plan? Reading food labels Check food labels for the amount of salt (sodium) per serving. Choose foods with less than 5 percent of the Daily Value of sodium. Generally, foods with less than 300 milligrams (mg) of sodium per serving fit into this eating plan. To find whole grains, look for the word "whole" as the first word in the ingredient list. Shopping Buy products labeled as "low-sodium" or "no salt added." Buy fresh foods. Avoid canned foods and pre-made or frozen meals. Cooking Avoid adding salt when cooking. Use salt-free seasonings or herbs instead of table salt or sea salt. Check with your health care provider or pharmacist before using salt substitutes. Do not fry foods. Cook foods using healthy methods such as baking, boiling, grilling, roasting, and broiling instead. Cook with heart-healthy oils, such as olive, canola, avocado, soybean, or sunflower oil. Meal planning  Eat a balanced diet that includes: 4 or more servings of fruits and 4 or more servings of vegetables each day. Try to fill one-half of your plate with fruits and vegetables. 6-8 servings of whole grains each day. Less than 6 oz (170 g) of lean meat, poultry, or fish each day. A 3-oz (85-g) serving of meat is about the same size as a deck of cards. One egg equals 1 oz (28 g). 2-3 servings of low-fat dairy each day. One serving is 1 cup (237 mL). 1 serving of nuts, seeds, or beans 5 times each week. 2-3 servings of heart-healthy fats. Healthy fats called omega-3 fatty acids are found in foods such as walnuts, flaxseeds, fortified milks, and eggs. These fats are also  found in cold-water fish, such as sardines, salmon, and mackerel. Limit how much you eat of: Canned or prepackaged foods. Food that is high in trans fat, such as some fried foods. Food that is high in saturated fat, such as fatty meat. Desserts and other sweets, sugary drinks, and other foods with added sugar. Full-fat dairy products. Do not salt foods before eating. Do not eat more than 4 egg yolks a week. Try to eat at least 2 vegetarian meals a week. Eat more home-cooked food and less restaurant, buffet, and fast food. Lifestyle When eating at a restaurant, ask that your food be prepared with less salt or no salt, if possible. If you drink alcohol: Limit how much you use to: 0-1 drink a day for women who are not pregnant. 0-2 drinks a day for men. Be aware of how much alcohol is in your drink. In the [place] one drink equals one 12 oz bottle of beer (355 mL), one 5 oz glass of wine (148 mL), or one 1 oz glass of hard liquor (44 mL). General information Avoid eating more than 2,300 mg of salt a day. If you have hypertension, you may need to reduce your sodium intake to 1,500 mg a day. Work with your health care provider to maintain a healthy body weight or to lose weight. Ask what an ideal weight is for you. Get at least 30 minutes of exercise that causes your heart to beat faster (aerobic exercise) most days of the week. Activities may include walking, swimming, or  biking. Work with your health care provider or dietitian to adjust your eating plan to your individual calorie needs. What foods should I eat? Fruits All fresh, dried, or frozen fruit. Canned fruit in natural juice (without added sugar). Vegetables Fresh or frozen vegetables (raw, steamed, roasted, or grilled). Low-sodium or reduced-sodium tomato and vegetable juice. Low-sodium or reduced-sodium tomato sauce and tomato paste. Low-sodium or reduced-sodium canned vegetables. Grains Whole-grain or whole-wheat bread. Whole-grain  or whole-wheat pasta. Brown rice. [place]. Bulgur. Whole-grain and low-sodium cereals. Pita bread. Low-fat, low-sodium crackers. Whole-wheat flour tortillas. Meats and other proteins Skinless chicken or [place]. Ground chicken or [place]. Pork with fat trimmed off. Fish and seafood. Egg whites. Dried beans, peas, or lentils. Unsalted nuts, nut butters, and seeds. Unsalted canned beans. Lean cuts of beef with fat trimmed off. Low-sodium, lean precooked or cured meat, such as sausages or meat loaves. Dairy Low-fat (1%) or fat-free (skim) milk. Reduced-fat, low-fat, or fat-free cheeses. Nonfat, low-sodium ricotta or cottage cheese. Low-fat or nonfat yogurt. Low-fat, low-sodium cheese. Fats and oils Soft margarine without trans fats. Vegetable oil. Reduced-fat, low-fat, or light mayonnaise and salad dressings (reduced-sodium). Canola, safflower, olive, avocado, soybean, and sunflower oils. Avocado. Seasonings and condiments Herbs. Spices. Seasoning mixes without salt. Other foods Unsalted popcorn and pretzels. Fat-free sweets. The items listed above may not be a complete list of foods and beverages you can eat. Contact a dietitian for more information. What foods should I avoid? Fruits Canned fruit in a light or heavy syrup. Fried fruit. Fruit in cream or butter sauce. Vegetables Creamed or fried vegetables. Vegetables in a cheese sauce. Regular canned vegetables (not low-sodium or reduced-sodium). Regular canned tomato sauce and paste (not low-sodium or reduced-sodium). Regular tomato and vegetable juice (not low-sodium or reduced-sodium). Angie Fava. Olives. Grains Baked goods made with fat, such as croissants, muffins, or some breads. Dry pasta or rice meal packs. Meats and other proteins Fatty cuts of meat. Ribs. Fried meat. Berniece Salines. Bologna, salami, and other precooked or cured meats, such as sausages or meat loaves. Fat from the back of a pig (fatback). Bratwurst. Salted nuts and seeds. Canned  beans with added salt. Canned or smoked fish. Whole eggs or egg yolks. Chicken or [place] with skin. Dairy Whole or 2% milk, cream, and half-and-half. Whole or full-fat cream cheese. Whole-fat or sweetened yogurt. Full-fat cheese. Nondairy creamers. Whipped toppings. Processed cheese and cheese spreads. Fats and oils Butter. Stick margarine. Lard. Shortening. Ghee. Bacon fat. Tropical oils, such as coconut, palm kernel, or palm oil. Seasonings and condiments Onion salt, garlic salt, seasoned salt, table salt, and sea salt. Worcestershire sauce. Tartar sauce. Barbecue sauce. Teriyaki sauce. Soy sauce, including reduced-sodium. Steak sauce. Canned and packaged gravies. Fish sauce. Oyster sauce. Cocktail sauce. Store-bought horseradish. Ketchup. Mustard. Meat flavorings and tenderizers. Bouillon cubes. Hot sauces. Pre-made or packaged marinades. Pre-made or packaged taco seasonings. Relishes. Regular salad dressings. Other foods Salted popcorn and pretzels. The items listed above may not be a complete list of foods and beverages you should avoid. Contact a dietitian for more information. Where to find more information National Heart, Lung, and Blood Institute: https://wilson-eaton.com/ American Heart Association: www.heart.org Academy of Nutrition and Dietetics: www.eatright.[place]: www.kidney.org Summary The DASH eating plan is a healthy eating plan that has been shown to reduce high blood pressure (hypertension). It may also reduce your risk for type 2 diabetes, heart disease, and stroke. When on the DASH eating plan, aim to eat more fresh fruits and vegetables, whole grains,  lean proteins, low-fat dairy, and heart-healthy fats. With the DASH eating plan, you should limit salt (sodium) intake to 2,300 mg a day. If you have hypertension, you may need to reduce your sodium intake to 1,500 mg a day. Work with your health care provider or dietitian to adjust your eating plan to your  individual calorie needs. This information is not intended to replace advice given to you by your health care provider. Make sure you discuss any questions you have with your health care provider. Document Revised: 04/30/2019 Document Reviewed: 04/30/2019 Elsevier Patient Education  2022 [employer].

## 2021-07-31 NOTE — Progress Notes (Signed)
Subjective:  Patient ID: Brent Wallace, male    DOB: 1960-11-28  Age: 61 y.o. MRN: [id number]  CC: Follow-up (6 month f/u on HTN )  HPI Accompanied by wife Former tobacco use Father with lung cancer, tobacco use. Personal history of tobacco use and second hand tobacco exposure. Tobacco use: 30-5yrs, Mentol cig and cigar, 1/2ppd x 45yrs, quit 63yrs ago  He agreed to complete CT chest low dose for lung cancer screen  Hypertension BP not at goal with olmesartan/hctz 20-12.5mg , coreg 3.125mg  BID, and furosemide 3x/week. BP Readings from Last 3 Encounters:  07/31/21 (!) 150/100  07/23/21 140/88  05/21/21 (!) 142/92   Increase olmesartan/HCTZ dose to 40/12.5mg  daily Maintain other med doses F/up in 86month  Depression, recurrent (HCC) Chronic, waxing and waning Secondary to chronic hip pain and lack of mobility Current use of cymbalta BID with some improvement. Declines need for psychotherapy.  Maintain current med dose Started olmesartan/HCTZ 43months ago  Wt Readings from Last 3 Encounters:  07/31/21 (!) 377 lb (171 kg)  07/23/21 (!) 378 lb 3.2 oz (171.6 kg)  05/21/21 (!) 376 lb (170.6 kg)    Depression screen [facility] 2/9 07/31/2021 06/29/2020 02/16/2018  Decreased Interest 2 3 0  Down, Depressed, Hopeless 2 2 1   PHQ - 2 Score 4 5 1   Altered sleeping 0 1 -  Tired, decreased energy 1 1 -  Change in appetite 1 0 -  Feeling bad or failure about yourself  2 3 -  Trouble concentrating 0 1 -  Moving slowly or fidgety/restless 0 0 -  Suicidal thoughts 0 0 -  PHQ-9 Score 8 11 -  Difficult doing work/chores Extremely dIfficult Not difficult at all -    GAD 7 : Generalized Anxiety Score 07/31/2021 06/29/2020  Nervous, Anxious, on Edge 0 3  Control/stop worrying 1 3  Worry too much - different things 2 3  Trouble relaxing 1 3  Restless 0 0  Easily annoyed or irritable 0 1  Afraid - awful might happen 0 3  Total GAD 7 Score 4 16  Anxiety Difficulty Extremely difficult Not  difficult at all   Reviewed past Medical, Social and Family history today.  Outpatient Medications Prior to Visit  Medication Sig Dispense Refill   cholecalciferol (VITAMIN D) 1000 units tablet Take 2,000 Units by mouth daily.     Continuous Blood Gluc Sensor (FREESTYLE LIBRE 3 SENSOR) MISC 1 each by Does not apply route every 14 (fourteen) days. 6 each 3   Insulin Aspart FlexPen (NOVOLOG) 100 UNIT/ML INJECT 20-30 UNITS UNDER THE SKIN WITH BREAKFAST, LUNCH AND EVENING MEAL 45 mL 3   Insulin Pen Needle (BD PEN NEEDLE NANO 2ND GEN) 32G X 4 MM MISC USE AS DIRECTED EVERY DAY 100 each 5   naproxen sodium (ALEVE) 220 MG tablet Take 220 mg by mouth.     ONETOUCH DELICA LANCETS 51O MISC Use 2x a day 100 each 11   Semaglutide, 2 MG/DOSE, (OZEMPIC, 2 MG/DOSE,) 8 MG/3ML SOPN Inject 2 mg into the skin once a week. 9 mL 3   sildenafil (VIAGRA) 100 MG tablet Take 0.5-1 tab 30 minutes prior to sexual activity as needed. 10 tablet 0   TURMERIC PO Take by mouth.     atorvastatin (LIPITOR) 20 MG tablet Take 1 tablet (20 mg total) by mouth 3 (three) times a week. 12 tablet 4   carvedilol (COREG) 3.125 MG tablet TAKE 1 TABLET BY MOUTH TWICE A DAY WITH MEAL 180 tablet 0  DULoxetine (CYMBALTA) 60 MG capsule Take 1 capsule (60 mg total) by mouth 2 (two) times daily. 180 capsule 1   furosemide (LASIX) 20 MG tablet TAKE 1/2 TABLET BY MOUTH ON MONDAY, WEDNESDAY, FRIDAY, SATURDAY AND SUNDAY 30 tablet 1   metFORMIN (GLUCOPHAGE-XR) 500 MG 24 hr tablet Take 1 tablet (500 mg total) by mouth 2 (two) times daily. 90 tablet 2   olmesartan-hydrochlorothiazide (BENICAR HCT) 20-12.5 MG tablet TAKE 1 TABLET BY MOUTH EVERY DAY 30 tablet 0   ONE TOUCH ULTRA TEST test strip TEST UP TO FOUR TIMES DAILY AS DIRECTED 400 each 3   lisinopril-hydrochlorothiazide (ZESTORETIC) 20-25 MG tablet Take 1 tablet by mouth daily. (Patient not taking: Reported on 07/31/2021) 90 tablet 1   No facility-administered medications prior to visit.    ROS See HPI  Objective:  BP (!) 150/100 (BP Location: Left Arm, Patient Position: Sitting, Cuff Size: Large)    Pulse 86    Temp (!) 97 F (36.1 C) (Temporal)    Ht 5\' 9"  (1.753 m)    Wt (!) 377 lb (171 kg)    SpO2 96%    BMI 55.67 kg/m   Physical Exam Constitutional:      Appearance: He is obese.  Cardiovascular:     Rate and Rhythm: Normal rate and regular rhythm.     Pulses: Normal pulses.     Heart sounds: Normal heart sounds.  Pulmonary:     Effort: Pulmonary effort is normal.     Breath sounds: Normal breath sounds.  Musculoskeletal:     Right lower leg: No edema.     Left lower leg: No edema.  Neurological:     Mental Status: He is alert.  Psychiatric:        Mood and Affect: Mood normal.        Behavior: Behavior normal.        Thought Content: Thought content normal.    Assessment & Plan:  This visit occurred during the SARS-CoV-2 public health emergency.  Safety protocols were in place, including screening questions prior to the visit, additional usage of staff PPE, and extensive cleaning of exam room while observing appropriate contact time as indicated for disinfecting solutions.   [name] was seen today for follow-up.  Diagnoses and all orders for this visit:  Primary hypertension -     Basic metabolic panel -     TSH -     olmesartan-hydrochlorothiazide (BENICAR HCT) 40-12.5 MG tablet; Take 1 tablet by mouth daily. Change in med dose -     carvedilol (COREG) 3.125 MG tablet; TAKE 1 TABLET BY MOUTH TWICE A DAY WITH MEAL  Hyperlipidemia associated with type 2 diabetes mellitus (HCC) -     Lipid panel -     atorvastatin (LIPITOR) 20 MG tablet; Take 1 tablet (20 mg total) by mouth 3 (three) times a week.  Prostate cancer screening -     PSA  Former tobacco use -     CT CHEST LUNG CA SCREEN LOW DOSE W/O CM; Future  Type 2 diabetes mellitus with circulatory disorder causing erectile dysfunction (HCC) -     glucose blood test strip; 1 each by Other route  in the morning, at noon, and at bedtime. One Touch Ultra -     metFORMIN (GLUCOPHAGE-XR) 500 MG 24 hr tablet; Take 1 tablet (500 mg total) by mouth 2 (two) times daily.  Depression, recurrent (HCC)  Bilateral leg edema -     furosemide (LASIX) 20 MG  tablet; 1/2tab on Mon, Wed, Fri, Sat, and Sun  Spinal stenosis of lumbar region with radiculopathy -     DULoxetine (CYMBALTA) 60 MG capsule; Take 1 capsule (60 mg total) by mouth 2 (two) times daily.  Hip pain -     DULoxetine (CYMBALTA) 60 MG capsule; Take 1 capsule (60 mg total) by mouth 2 (two) times daily.   Problem List Items Addressed This Visit       Cardiovascular and Mediastinum   Hypertension - Primary    BP not at goal with olmesartan/hctz 20-12.5mg , coreg 3.125mg  BID, and furosemide 3x/week. BP Readings from Last 3 Encounters:  07/31/21 (!) 150/100  07/23/21 140/88  05/21/21 (!) 142/92   Increase olmesartan/HCTZ dose to 40/12.5mg  daily Maintain other med doses F/up in 58month      Relevant Medications   olmesartan-hydrochlorothiazide (BENICAR HCT) 40-12.5 MG tablet   atorvastatin (LIPITOR) 20 MG tablet   carvedilol (COREG) 3.125 MG tablet   furosemide (LASIX) 20 MG tablet   Other Relevant Orders   Basic metabolic panel (Completed)   TSH (Completed)   Type 2 diabetes mellitus with circulatory disorder causing erectile dysfunction (HCC)   Relevant Medications   olmesartan-hydrochlorothiazide (BENICAR HCT) 40-12.5 MG tablet   atorvastatin (LIPITOR) 20 MG tablet   glucose blood test strip   carvedilol (COREG) 3.125 MG tablet   furosemide (LASIX) 20 MG tablet   metFORMIN (GLUCOPHAGE-XR) 500 MG 24 hr tablet     Endocrine   Hyperlipidemia associated with type 2 diabetes mellitus (HCC)   Relevant Medications   olmesartan-hydrochlorothiazide (BENICAR HCT) 40-12.5 MG tablet   atorvastatin (LIPITOR) 20 MG tablet   carvedilol (COREG) 3.125 MG tablet   furosemide (LASIX) 20 MG tablet   metFORMIN (GLUCOPHAGE-XR) 500 MG  24 hr tablet   Other Relevant Orders   Lipid panel (Completed)     Nervous and Auditory   Spinal stenosis of lumbar region with radiculopathy   Relevant Medications   DULoxetine (CYMBALTA) 60 MG capsule     Other   Depression, recurrent (HCC)    Chronic, waxing and waning Secondary to chronic hip pain and lack of mobility Current use of cymbalta BID with some improvement. Declines need for psychotherapy.  Maintain current med dose      Relevant Medications   DULoxetine (CYMBALTA) 60 MG capsule   Former tobacco use    Father with lung cancer, tobacco use. Personal history of tobacco use and second hand tobacco exposure. Tobacco use: 30-89yrs, Mentol cig and cigar, 1/2ppd x 12yrs, quit 40yrs ago  He agreed to complete CT chest low dose for lung cancer screen      Relevant Orders   CT CHEST LUNG CA SCREEN LOW DOSE W/O CM   Hip pain   Relevant Medications   DULoxetine (CYMBALTA) 60 MG capsule   Other Visit Diagnoses     Prostate cancer screening       Relevant Orders   PSA (Completed)   Bilateral leg edema       Relevant Medications   furosemide (LASIX) 20 MG tablet       Follow-up: Return in about 4 weeks (around 08/28/2021) for HTN.  Wilfred Lacy, NP

## 2021-08-01 LAB — BASIC METABOLIC PANEL
BUN: 13 mg/dL (ref 6–23)
CO2: 36 mEq/L — ABNORMAL HIGH (ref 19–32)
Calcium: 10.1 mg/dL (ref 8.4–10.5)
Chloride: 95 mEq/L — ABNORMAL LOW (ref 96–112)
Creatinine, Ser: 0.99 mg/dL (ref 0.40–1.50)
GFR: 81.9 mL/min (ref >60.00)
Glucose, Bld: 212 mg/dL — ABNORMAL HIGH (ref 70–99)
Potassium: 4 mEq/L (ref 3.5–5.1)
Sodium: 136 mEq/L (ref 135–145)

## 2021-08-01 LAB — LIPID PANEL
Cholesterol: 181 mg/dL (ref 0–200)
HDL: 56.6 mg/dL (ref >39.00)
LDL Cholesterol: 94 mg/dL (ref 0–99)
NonHDL: 124.18
Total CHOL/HDL Ratio: 3
Triglycerides: 152 mg/dL — ABNORMAL HIGH (ref 0.0–149.0)
VLDL: 30.4 mg/dL (ref 0.0–40.0)

## 2021-08-01 LAB — TSH: TSH: 1.04 u[IU]/mL (ref 0.35–5.50)

## 2021-08-01 LAB — PSA: PSA: 0.54 ng/mL (ref 0.10–4.00)

## 2021-08-02 DIAGNOSIS — F339 Major depressive disorder, recurrent, unspecified: Secondary | ICD-10-CM | POA: Insufficient documentation

## 2021-08-02 MED ORDER — DULOXETINE HCL 60 MG PO CPEP: 60.0000 mg | ORAL_CAPSULE | Freq: Two times a day (BID) | ORAL | 1 refills | Status: DC

## 2021-08-02 MED ORDER — FUROSEMIDE 20 MG PO TABS: ORAL_TABLET | ORAL | 1 refills | Status: DC

## 2021-08-02 MED ORDER — CARVEDILOL 3.125 MG PO TABS: ORAL_TABLET | ORAL | 3 refills | Status: DC

## 2021-08-02 MED ORDER — METFORMIN HCL ER 500 MG PO TB24: 500.0000 mg | ORAL_TABLET | Freq: Two times a day (BID) | ORAL | 3 refills | Status: DC

## 2021-08-02 NOTE — Assessment & Plan Note (Signed)
Father with lung cancer, tobacco use. Personal history of tobacco use and second hand tobacco exposure. Tobacco use: 30-52yrs, Mentol cig and cigar, 1/2ppd x 9yrs, quit 48yrs ago  He agreed to complete CT chest low dose for lung cancer screen

## 2021-08-02 NOTE — Assessment & Plan Note (Signed)
Chronic, waxing and waning Secondary to chronic hip pain and lack of mobility Current use of cymbalta BID with some improvement. Declines need for psychotherapy.  Maintain current med dose

## 2021-08-02 NOTE — Assessment & Plan Note (Signed)
BP not at goal with olmesartan/hctz 20-12.5mg , coreg 3.125mg  BID, and furosemide 3x/week. BP Readings from Last 3 Encounters:  07/31/21 (!) 150/100  07/23/21 140/88  05/21/21 (!) 142/92   Increase olmesartan/HCTZ dose to 40/12.5mg  daily Maintain other med doses F/up in 24month

## 2021-08-18 ENCOUNTER — Other Ambulatory Visit: Payer: Self-pay | Admitting: Nurse Practitioner

## 2021-08-18 ENCOUNTER — Other Ambulatory Visit: Payer: Self-pay | Admitting: Pulmonary Disease

## 2021-08-18 DIAGNOSIS — E1169 Type 2 diabetes mellitus with other specified complication: Secondary | ICD-10-CM

## 2021-08-18 DIAGNOSIS — E1159 Type 2 diabetes mellitus with other circulatory complications: Secondary | ICD-10-CM

## 2021-08-20 NOTE — Telephone Encounter (Signed)
Rx filled by PCP on 08/02/2021 ?

## 2021-08-25 ENCOUNTER — Other Ambulatory Visit: Payer: Self-pay | Admitting: Internal Medicine

## 2021-08-29 ENCOUNTER — Other Ambulatory Visit: Payer: Self-pay | Admitting: Nurse Practitioner

## 2021-08-29 DIAGNOSIS — R600 Localized edema: Secondary | ICD-10-CM

## 2021-09-03 ENCOUNTER — Encounter: Payer: Self-pay | Admitting: Nurse Practitioner

## 2021-09-03 ENCOUNTER — Ambulatory Visit (INDEPENDENT_AMBULATORY_CARE_PROVIDER_SITE_OTHER): Payer: BC Managed Care – PPO | Admitting: Nurse Practitioner

## 2021-09-03 VITALS — BP 144/92 | HR 80 | Temp 96.8°F | Ht 69.0 in | Wt 380.4 lb

## 2021-09-03 DIAGNOSIS — I509 Heart failure, unspecified: Secondary | ICD-10-CM | POA: Insufficient documentation

## 2021-09-03 DIAGNOSIS — M160 Bilateral primary osteoarthritis of hip: Secondary | ICD-10-CM

## 2021-09-03 DIAGNOSIS — M25551 Pain in right hip: Secondary | ICD-10-CM

## 2021-09-03 DIAGNOSIS — G8929 Other chronic pain: Secondary | ICD-10-CM

## 2021-09-03 DIAGNOSIS — Z8719 Personal history of other diseases of the digestive system: Secondary | ICD-10-CM

## 2021-09-03 DIAGNOSIS — I10 Essential (primary) hypertension: Secondary | ICD-10-CM | POA: Diagnosis not present

## 2021-09-03 DIAGNOSIS — Z6841 Body Mass Index (BMI) 40.0 and over, adult: Secondary | ICD-10-CM

## 2021-09-03 DIAGNOSIS — M25552 Pain in left hip: Secondary | ICD-10-CM

## 2021-09-03 MED ORDER — OLMESARTAN MEDOXOMIL-HCTZ 40-12.5 MG PO TABS: 1.0000 | ORAL_TABLET | Freq: Every day | ORAL | 3 refills | Status: DC

## 2021-09-03 MED ORDER — SPIRONOLACTONE 25 MG PO TABS: 25.0000 mg | ORAL_TABLET | Freq: Every day | ORAL | 5 refills | Status: DC

## 2021-09-03 NOTE — Assessment & Plan Note (Signed)
Declined referral to bariatric surgery. ?He chooses to continue with GLP-1 injection, dietary modification, small meal portions, and home exercises. He plans to incorporate chair exercises and water aerobics. ?Wt Readings from Last 3 Encounters:  ?09/03/21 (!) 380 lb 6.4 oz (172.5 kg)  ?07/31/21 (!) 377 lb (171 kg)  ?07/23/21 (!) 378 lb 3.2 oz (171.6 kg)  ? ?

## 2021-09-03 NOTE — Progress Notes (Signed)
? ?             Established Patient Visit ? ?Patient: Brent Wallace   DOB: June 17, 1960   61 y.o. Male  MRN: [id number] ?Visit Date: 09/03/2021 ? ?Subjective:  ?  ?Chief Complaint  ?Patient presents with  ? Follow-up  ?  1 month f/u on HTN.  ?Diabetic eye exam in 12/2020, letter sent to Dr. Frederico Hamman for recent eye exam.  ?Pt states he is going to schedule Colonoscopy. ?Shingles vaccine given today.   ? ?HPI ?Chronic hip pain ?Managed by [facility], relief with toradol injections ?Previous use of Tramadol. d/c due to side effects( abnormal sensation in body). ?Hx of GI bleed with indomethacin(2015) ?Not sure why gabapentin was d/c in past. Took '600mg'$  at hs. ?Does not want to take any opioid for pain, due to fear of dependence and abuse. ?Current use of aleve daily. ?Dg hips 06/2019: No acute osseous abnormality. End-stage degenerative changes of both hips ? ?Advised to consider use of gabapentin and a low dose opioid. ?Maintain appts with ortho ? ? ?Hypertension ?Improved but not at goal. ?Reports compliance with medications and low sodium diet. ?No adverse effects with current medications ?BP Readings from Last 3 Encounters:  ?09/03/21 (!) 144/92  ?07/31/21 (!) 150/100  ?07/23/21 140/88  ? ?Maintain olmesartan/HCTZ and coreg ?D/c furosemide ?Start spironolactone '25mg'$  daily ?F/up in 47month repeat BMP ? ?Acute on chronic congestive heart failure, unspecified heart failure type ([place] ?resolved ? ?Obesity ?Declined referral to bariatric surgery. ?He chooses to continue with GLP-1 injection, dietary modification, small meal portions, and home exercises. He plans to incorporate chair exercises and water aerobics. ?Wt Readings from Last 3 Encounters:  ?09/03/21 (!) 380 lb 6.4 oz (172.5 kg)  ?07/31/21 (!) 377 lb (171 kg)  ?07/23/21 (!) 378 lb 3.2 oz (171.6 kg)  ? ?BP Readings from Last 3 Encounters:  ?09/03/21 (!) 144/92  ?07/31/21 (!) 150/100  ?07/23/21 140/88  ?  ?Wt Readings from Last 3 Encounters:  ?09/03/21 (!) 380 lb  6.4 oz (172.5 kg)  ?07/31/21 (!) 377 lb (171 kg)  ?07/23/21 (!) 378 lb 3.2 oz (171.6 kg)  ?  ?Reviewed medical, surgical, and social history today ? ?Medications: ?Outpatient Medications Prior to Visit  ?Medication Sig Note  ? atorvastatin (LIPITOR) 20 MG tablet Take 1 tablet (20 mg total) by mouth 3 (three) times a week.   ? carvedilol (COREG) 3.125 MG tablet TAKE 1 TABLET BY MOUTH TWICE A DAY WITH MEAL   ? cholecalciferol (VITAMIN D) 1000 units tablet Take 2,000 Units by mouth daily.   ? Continuous Blood Gluc Sensor (FREESTYLE LIBRE 3 SENSOR) MISC 1 each by Does not apply route every 14 (fourteen) days.   ? DULoxetine (CYMBALTA) 60 MG capsule Take 1 capsule (60 mg total) by mouth 2 (two) times daily.   ? glucose blood test strip 1 each by Other route in the morning, at noon, and at bedtime. One Touch Ultra   ? Insulin Aspart FlexPen (NOVOLOG) 100 UNIT/ML INJECT 20-30 UNITS UNDER THE SKIN WITH BREAKFAST, LUNCH AND EVENING MEAL   ? Insulin Pen Needle (BD PEN NEEDLE NANO 2ND GEN) 32G X 4 MM MISC USE AS DIRECTED EVERY DAY   ? metFORMIN (GLUCOPHAGE-XR) 500 MG 24 hr tablet Take 1 tablet (500 mg total) by mouth 2 (two) times daily.   ? naproxen sodium (ALEVE) 220 MG tablet Take 220 mg by mouth.   ? ONETOUCH DELICA LANCETS 394RMISC Use 2x a day   ?  OZEMPIC, 2 MG/DOSE, 8 MG/3ML SOPN INJECT '2MG'$  INTO THE SKIN ONCE A WEEK.   ? sildenafil (VIAGRA) 100 MG tablet Take 0.5-1 tab 30 minutes prior to sexual activity as needed.   ? [DISCONTINUED] furosemide (LASIX) 20 MG tablet TAKE 1/2 TAB ON MON, WED, FRI, SAT AND SUN 09/03/2021: Pt takes differently, takes daily  ? [DISCONTINUED] olmesartan-hydrochlorothiazide (BENICAR HCT) 40-12.5 MG tablet Take 1 tablet by mouth daily. Change in med dose   ? TURMERIC PO Take by mouth.   ? ?No facility-administered medications prior to visit.  ? ?Reviewed past medical and social history.  ? ?ROS per HPI above ? ? ?   ?Objective:  ?BP (!) 144/92 (BP Location: Right Arm, Patient Position: Sitting,  Cuff Size: Large)   Pulse 80   Temp (!) 96.8 ?F (36 ?C) (Temporal)   Ht '5\' 9"'$  (1.753 m)   Wt (!) 380 lb 6.4 oz (172.5 kg)   SpO2 95%   BMI 56.18 kg/m?  ? ?  ? ?Physical Exam ?Cardiovascular:  ?   Rate and Rhythm: Normal rate and regular rhythm.  ?   Pulses: Normal pulses.  ?   Heart sounds: Normal heart sounds.  ?Pulmonary:  ?   Effort: Pulmonary effort is normal.  ?   Breath sounds: Normal breath sounds.  ?Musculoskeletal:  ?   Cervical back: Normal range of motion and neck supple.  ?Neurological:  ?   Mental Status: He is alert.  ?  ?No results found for any visits on 09/03/21. ?   ?Assessment & Plan:  ?  ?Problem List Items Addressed This Visit   ? ?  ? Cardiovascular and Mediastinum  ? RESOLVED: Acute on chronic congestive heart failure, unspecified heart failure type ([place])  ?  resolved ?  ?  ? Relevant Medications  ? olmesartan-hydrochlorothiazide (BENICAR HCT) 40-12.5 MG tablet  ? spironolactone (ALDACTONE) 25 MG tablet  ? Hypertension - Primary  ?  Improved but not at goal. ?Reports compliance with medications and low sodium diet. ?No adverse effects with current medications ?BP Readings from Last 3 Encounters:  ?09/03/21 (!) 144/92  ?07/31/21 (!) 150/100  ?07/23/21 140/88  ? ?Maintain olmesartan/HCTZ and coreg ?D/c furosemide ?Start spironolactone '25mg'$  daily ?F/up in 61month repeat BMP ?  ?  ? Relevant Medications  ? olmesartan-hydrochlorothiazide (BENICAR HCT) 40-12.5 MG tablet  ? spironolactone (ALDACTONE) 25 MG tablet  ?  ? Musculoskeletal and Integument  ? Primary osteoarthritis of both hips  ?  ? Other  ? Chronic hip pain  ?  Managed by [facility], relief with toradol injections ?Previous use of Tramadol. d/c due to side effects( abnormal sensation in body). ?Hx of GI bleed with indomethacin(2015) ?Not sure why gabapentin was d/c in past. Took '600mg'$  at hs. ?Does not want to take any opioid for pain, due to fear of dependence and abuse. ?Current use of aleve daily. ?Dg hips 06/2019: No acute  osseous abnormality. End-stage degenerative changes of both hips ? ?Advised to consider use of gabapentin and a low dose opioid. ?Maintain appts with ortho ? ?  ?  ? History of pancreatitis  ? Obesity  ?  Declined referral to bariatric surgery. ?He chooses to continue with GLP-1 injection, dietary modification, small meal portions, and home exercises. He plans to incorporate chair exercises and water aerobics. ?Wt Readings from Last 3 Encounters:  ?09/03/21 (!) 380 lb 6.4 oz (172.5 kg)  ?07/31/21 (!) 377 lb (171 kg)  ?07/23/21 (!) 378 lb 3.2 oz (171.6 kg)  ? ?  ?  ? ?  Return in about 4 weeks (around 10/01/2021) for HTN (repeat BMP). ? ?  ? ?Wilfred Lacy, NP ? ? ?

## 2021-09-03 NOTE — Assessment & Plan Note (Signed)
Managed by [facility], relief with toradol injections ?Previous use of Tramadol. d/c due to side effects( abnormal sensation in body). ?Hx of GI bleed with indomethacin(2015) ?Not sure why gabapentin was d/c in past. Took '600mg'$  at hs. ?Does not want to take any opioid for pain, due to fear of dependence and abuse. ?Current use of aleve daily. ?Dg hips 06/2019: No acute osseous abnormality. End-stage degenerative changes of both hips ? ?Advised to consider use of gabapentin and a low dose opioid. ?Maintain appts with ortho ? ?

## 2021-09-03 NOTE — Assessment & Plan Note (Signed)
resolved 

## 2021-09-03 NOTE — Assessment & Plan Note (Signed)
Improved but not at goal. ?Reports compliance with medications and low sodium diet. ?No adverse effects with current medications ?BP Readings from Last 3 Encounters:  ?09/03/21 (!) 144/92  ?07/31/21 (!) 150/100  ?07/23/21 140/88  ? ?Maintain olmesartan/HCTZ and coreg ?D/c furosemide ?Start spironolactone '25mg'$  daily ?F/up in 21month repeat BMP ?

## 2021-09-03 NOTE — Patient Instructions (Signed)
D/c furosemide ?Start spironolactone '25mg'$  daily ?Maintain other medication doses ? ?Try omeprazole '20mg'$  in AM (before breakfast) ?Do not  drink coffee of empty stomach. ? ?Maintain 64oz of water per day ?Start chair exercise ?Use resistant band for strength training. ?

## 2021-09-10 ENCOUNTER — Encounter: Payer: Self-pay | Admitting: Nurse Practitioner

## 2021-09-10 DIAGNOSIS — W19XXXA Unspecified fall, initial encounter: Secondary | ICD-10-CM

## 2021-09-10 DIAGNOSIS — M25552 Pain in left hip: Secondary | ICD-10-CM

## 2021-09-17 DIAGNOSIS — M4316 Spondylolisthesis, lumbar region: Secondary | ICD-10-CM | POA: Diagnosis not present

## 2021-09-17 DIAGNOSIS — M461 Sacroiliitis, not elsewhere classified: Secondary | ICD-10-CM | POA: Diagnosis not present

## 2021-09-17 DIAGNOSIS — M5136 Other intervertebral disc degeneration, lumbar region: Secondary | ICD-10-CM | POA: Diagnosis not present

## 2021-09-17 DIAGNOSIS — M47817 Spondylosis without myelopathy or radiculopathy, lumbosacral region: Secondary | ICD-10-CM | POA: Diagnosis not present

## 2021-09-17 DIAGNOSIS — M160 Bilateral primary osteoarthritis of hip: Secondary | ICD-10-CM | POA: Diagnosis not present

## 2021-09-17 DIAGNOSIS — Z6841 Body Mass Index (BMI) 40.0 and over, adult: Secondary | ICD-10-CM | POA: Diagnosis not present

## 2021-09-17 DIAGNOSIS — M1909 Primary osteoarthritis, other specified site: Secondary | ICD-10-CM | POA: Diagnosis not present

## 2021-10-08 ENCOUNTER — Ambulatory Visit (INDEPENDENT_AMBULATORY_CARE_PROVIDER_SITE_OTHER): Payer: BC Managed Care – PPO | Admitting: Nurse Practitioner

## 2021-10-08 ENCOUNTER — Encounter: Payer: Self-pay | Admitting: Nurse Practitioner

## 2021-10-08 VITALS — BP 138/76 | HR 84 | Temp 97.8°F | Ht 69.0 in | Wt 369.4 lb

## 2021-10-08 DIAGNOSIS — I10 Essential (primary) hypertension: Secondary | ICD-10-CM | POA: Diagnosis not present

## 2021-10-08 DIAGNOSIS — M79604 Pain in right leg: Secondary | ICD-10-CM | POA: Diagnosis not present

## 2021-10-08 DIAGNOSIS — Z6841 Body Mass Index (BMI) 40.0 and over, adult: Secondary | ICD-10-CM

## 2021-10-08 LAB — RENAL FUNCTION PANEL
Albumin: 4.3 g/dL (ref 3.5–5.2)
BUN: 15 mg/dL (ref 6–23)
CO2: 31 mEq/L (ref 19–32)
Calcium: 9.2 mg/dL (ref 8.4–10.5)
Chloride: 95 mEq/L — ABNORMAL LOW (ref 96–112)
Creatinine, Ser: 1 mg/dL (ref 0.40–1.50)
GFR: 80.81 mL/min (ref >60.00)
Glucose, Bld: 173 mg/dL — ABNORMAL HIGH (ref 70–99)
Phosphorus: 3.1 mg/dL (ref 2.3–4.6)
Potassium: 4.2 mEq/L (ref 3.5–5.1)
Sodium: 135 mEq/L (ref 135–145)

## 2021-10-08 NOTE — Progress Notes (Signed)
? ?             Established Patient Visit ? ?Patient: Brent Wallace   DOB: 10-15-1960   61 y.o. Male  MRN: [id number] ?Visit Date: 10/08/2021 ? ?Subjective:  ?  ?Chief Complaint  ?Patient presents with  ? Follow-up  ?  1 month f/u on HTN.  ?Pt has been checking BP at home a few times and has a log  ? ?Accompanied by wife. ? ?Leg Pain  ?The incident occurred more than 1 week ago. There was no injury mechanism. The pain is present in the right thigh. The quality of the pain is described as aching. The pain is moderate. The pain has been Intermittent since onset. Pertinent negatives include no inability to bear weight, loss of motion, loss of sensation, muscle weakness, numbness or tingling. The symptoms are aggravated by palpation. He has tried nothing for the symptoms.  ?Hypertension ?BP at goal ?No adverse effects with spironolactone, benicar/hct, and coreg. ?Compliant with CPAP machine and medications ?BP Readings from Last 3 Encounters:  ?10/08/21 138/76  ?09/03/21 (!) 144/92  ?07/31/21 (!) 150/100  ? ?Repeat renal function: stable electrolytes and renal function ?Maintain med doses ? ?Class 3 severe obesity due to excess calories with serious comorbidity and body mass index (BMI) of 50.0 to 59.9 in adult [facility]) ?Lost 11lbs in last 1.35months. ?Current use of GLP-1 injection and diet modification. ?He is contemplating bariatric surgery but has not made final decision. He is hesitate about regaining weight after bariatric surgery. ?Advised to consider bariatric surgery as a tool to facilitate weight loss, which will in turn allow him to get hip surgery. The risk of regaining weight is not any higher after bariatric surgery vs with use of GLP-1 injection combined with lifestyle modification. Advised about the need for lifelong commitment to following the recommended diet and daily exercise without or without bariatric surgery. He verbalized understanding ?  ?Wt Readings from Last 3 Encounters:  ?10/08/21 (!) 369 lb 6.4  oz (167.6 kg)  ?09/03/21 (!) 380 lb 6.4 oz (172.5 kg)  ?07/31/21 (!) 377 lb (171 kg)  ?  ?Reviewed medical, surgical, and social history today ? ?Medications: ?Outpatient Medications Prior to Visit  ?Medication Sig  ? atorvastatin (LIPITOR) 20 MG tablet Take 1 tablet (20 mg total) by mouth 3 (three) times a week.  ? carvedilol (COREG) 3.125 MG tablet TAKE 1 TABLET BY MOUTH TWICE A DAY WITH MEAL  ? cholecalciferol (VITAMIN D) 1000 units tablet Take 2,000 Units by mouth daily.  ? Continuous Blood Gluc Sensor (FREESTYLE LIBRE 3 SENSOR) MISC 1 each by Does not apply route every 14 (fourteen) days.  ? DULoxetine (CYMBALTA) 60 MG capsule Take 1 capsule (60 mg total) by mouth 2 (two) times daily.  ? glucose blood test strip 1 each by Other route in the morning, at noon, and at bedtime. One Touch Ultra  ? Insulin Aspart FlexPen (NOVOLOG) 100 UNIT/ML INJECT 20-30 UNITS UNDER THE SKIN WITH BREAKFAST, LUNCH AND EVENING MEAL  ? Insulin Pen Needle (BD PEN NEEDLE NANO 2ND GEN) 32G X 4 MM MISC USE AS DIRECTED EVERY DAY  ? metFORMIN (GLUCOPHAGE-XR) 500 MG 24 hr tablet Take 1 tablet (500 mg total) by mouth 2 (two) times daily.  ? naproxen sodium (ALEVE) 220 MG tablet Take 220 mg by mouth.  ? olmesartan-hydrochlorothiazide (BENICAR HCT) 40-12.5 MG tablet Take 1 tablet by mouth daily.  ? ONETOUCH DELICA LANCETS 81L MISC Use 2x a day  ? [place],  2 MG/DOSE, 8 MG/3ML SOPN INJECT $RemoveBef'2MG'tMsSwvVaZU$  INTO THE SKIN ONCE A WEEK.  ? sildenafil (VIAGRA) 100 MG tablet Take 0.5-1 tab 30 minutes prior to sexual activity as needed.  ? spironolactone (ALDACTONE) 25 MG tablet Take 1 tablet (25 mg total) by mouth daily.  ? ?No facility-administered medications prior to visit.  ? ?Reviewed past medical and social history.  ? ?ROS per HPI above ? ? ?   ?Objective:  ?BP 138/76 (BP Location: Left Arm, Patient Position: Sitting, Cuff Size: Normal)   Pulse 84   Temp 97.8 ?F (36.6 ?C) (Temporal)   Ht $R'5\' 9"'as$  (1.753 m)   Wt (!) 369 lb 6.4 oz (167.6 kg)   SpO2 98%   BMI  54.55 kg/m?  ? ?  ? ?Physical Exam ?Vitals reviewed.  ?Constitutional:   ?   Appearance: He is obese.  ?Cardiovascular:  ?   Rate and Rhythm: Normal rate.  ?   Pulses: Normal pulses.  ?Pulmonary:  ?   Effort: Pulmonary effort is normal.  ?Musculoskeletal:     ?   General: Tenderness present.  ?   Right lower leg: No edema.  ?   Left lower leg: No edema.  ?     Legs: ? ?   Comments: Palpable tender varicose vein, no erythema, no induration, no swelling  ?Neurological:  ?   Mental Status: He is alert.  ?Psychiatric:     ?   Mood and Affect: Mood normal.     ?   Behavior: Behavior normal.     ?   Thought Content: Thought content normal.  ?  ?Results for orders placed or performed in visit on 10/08/21  ?Renal Function Panel  ?Result Value Ref Range  ? Sodium 135 135 - 145 mEq/L  ? Potassium 4.2 3.5 - 5.1 mEq/L  ? Chloride 95 (L) 96 - 112 mEq/L  ? CO2 31 19 - 32 mEq/L  ? Albumin 4.3 3.5 - 5.2 g/dL  ? BUN 15 6 - 23 mg/dL  ? Creatinine, Ser 1.00 0.40 - 1.50 mg/dL  ? Glucose, Bld 173 (H) 70 - 99 mg/dL  ? Phosphorus 3.1 2.3 - 4.6 mg/dL  ? GFR 80.81 >60.00 mL/min  ? Calcium 9.2 8.4 - 10.5 mg/dL  ? ?   ?Assessment & Plan:  ?  ?Problem List Items Addressed This Visit   ? ?  ? Cardiovascular and Mediastinum  ? Hypertension - Primary  ?  BP at goal ?No adverse effects with spironolactone, benicar/hct, and coreg. ?Compliant with CPAP machine and medications ?BP Readings from Last 3 Encounters:  ?10/08/21 138/76  ?09/03/21 (!) 144/92  ?07/31/21 (!) 150/100  ? ?Repeat renal function: stable electrolytes and renal function ?Maintain med doses ?  ?  ? Relevant Orders  ? Renal Function Panel (Completed)  ? Renal function panel with eGFR  ?  ? Other  ? Class 3 severe obesity due to excess calories with serious comorbidity and body mass index (BMI) of 50.0 to 59.9 in adult [facility])  ?  Lost 11lbs in last 1.33months. ?Current use of GLP-1 injection and diet modification. ?He is contemplating bariatric surgery but has not made final decision.  He is hesitate about regaining weight after bariatric surgery. ?Advised to consider bariatric surgery as a tool to facilitate weight loss, which will in turn allow him to get hip surgery. The risk of regaining weight is not any higher after bariatric surgery vs with use of GLP-1 injection combined with lifestyle modification. Advised  about the need for lifelong commitment to following the recommended diet and daily exercise without or without bariatric surgery. He verbalized understanding ? ?  ?  ? ?Other Visit Diagnoses   ? ? Right leg pain      ? Relevant Orders  ? US Venous Img Lower Unilateral Right (DVT)  ? ?  ? ?Return in about 3 months (around 01/08/2022) for HTN and , hyperlipidemia. ? ?  ? ?Wilfred Lacy, NP ? ? ?

## 2021-10-08 NOTE — Assessment & Plan Note (Addendum)
BP at goal ?No adverse effects with spironolactone, benicar/hct, and coreg. ?Compliant with CPAP machine and medications ?BP Readings from Last 3 Encounters:  ?10/08/21 138/76  ?09/03/21 (!) 144/92  ?07/31/21 (!) 150/100  ? ?Repeat renal function: stable electrolytes and renal function ?Maintain med doses ?

## 2021-10-08 NOTE — Patient Instructions (Addendum)
Go to lab ?Maintain current med doses. ?You will be contacted to schedule appt for venous US ? ?Schedule appt for diabetic eye exam. Have report sent to me. ?Schedule appt with GI for colonoscopy ? ?

## 2021-10-08 NOTE — Assessment & Plan Note (Signed)
Lost 11lbs in last 1.24month. ?Current use of GLP-1 injection and diet modification. ?He is contemplating bariatric surgery but has not made final decision. He is hesitate about regaining weight after bariatric surgery. ?Advised to consider bariatric surgery as a tool to facilitate weight loss, which will in turn allow him to get hip surgery. The risk of regaining weight is not any higher after bariatric surgery vs with use of GLP-1 injection combined with lifestyle modification. Advised about the need for lifelong commitment to following the recommended diet and daily exercise without or without bariatric surgery. He verbalized understanding ?

## 2021-10-18 ENCOUNTER — Telehealth: Payer: Self-pay

## 2021-10-18 DIAGNOSIS — M160 Bilateral primary osteoarthritis of hip: Secondary | ICD-10-CM

## 2021-10-18 DIAGNOSIS — G8929 Other chronic pain: Secondary | ICD-10-CM

## 2021-10-18 DIAGNOSIS — M48061 Spinal stenosis, lumbar region without neurogenic claudication: Secondary | ICD-10-CM

## 2021-10-18 NOTE — Telephone Encounter (Signed)
Patient wife called and states he is in need of a better motorized scooter and would like a Rx for this.  ?

## 2021-10-22 NOTE — Addendum Note (Signed)
Addended by: Leana Gamer on: 10/22/2021 02:38 PM ? ? Modules accepted: Orders ? ?

## 2021-10-29 ENCOUNTER — Ambulatory Visit
Admission: RE | Admit: 2021-10-29 | Discharge: 2021-10-29 | Disposition: A | Discharge status: Home / Self Care | Payer: BC Managed Care – PPO | Source: Ambulatory Visit | Attending: Nurse Practitioner | Admitting: Nurse Practitioner

## 2021-10-29 ENCOUNTER — Ambulatory Visit: Payer: BC Managed Care – PPO

## 2021-10-29 ENCOUNTER — Other Ambulatory Visit: Payer: BC Managed Care – PPO

## 2021-10-29 DIAGNOSIS — J984 Other disorders of lung: Secondary | ICD-10-CM | POA: Diagnosis not present

## 2021-10-29 DIAGNOSIS — Z87891 Personal history of nicotine dependence: Secondary | ICD-10-CM | POA: Diagnosis not present

## 2021-10-29 DIAGNOSIS — M79661 Pain in right lower leg: Secondary | ICD-10-CM | POA: Diagnosis not present

## 2021-10-29 DIAGNOSIS — I517 Cardiomegaly: Secondary | ICD-10-CM | POA: Diagnosis not present

## 2021-10-29 DIAGNOSIS — M79604 Pain in right leg: Secondary | ICD-10-CM

## 2021-10-31 ENCOUNTER — Telehealth: Payer: Self-pay

## 2021-10-31 NOTE — Telephone Encounter (Signed)
Left patient a detailed voice message for Brent Wallace to return call to office regarding where she would like order for motorized scooter to be faxed to.

## 2021-10-31 NOTE — Telephone Encounter (Signed)
Ms. Brent Wallace returned my car regarding motorized scooter. She confirmed [facility] in [place] on [address]. Faxed over rx to [phone].   Brent Wallace also inquired about CT results. I advised her of annotation from provider that results were negative and to repeat in 1 year. She verbalized understanding.

## 2021-11-03 ENCOUNTER — Other Ambulatory Visit: Payer: Self-pay | Admitting: Pulmonary Disease

## 2021-11-03 ENCOUNTER — Other Ambulatory Visit: Payer: Self-pay | Admitting: Nurse Practitioner

## 2021-11-03 DIAGNOSIS — E1169 Type 2 diabetes mellitus with other specified complication: Secondary | ICD-10-CM

## 2021-11-26 DIAGNOSIS — M25552 Pain in left hip: Secondary | ICD-10-CM | POA: Diagnosis not present

## 2021-11-26 DIAGNOSIS — M160 Bilateral primary osteoarthritis of hip: Secondary | ICD-10-CM | POA: Diagnosis not present

## 2021-11-26 DIAGNOSIS — M25551 Pain in right hip: Secondary | ICD-10-CM | POA: Diagnosis not present

## 2021-12-17 ENCOUNTER — Ambulatory Visit: Payer: BC Managed Care – PPO | Admitting: Internal Medicine

## 2022-01-14 ENCOUNTER — Encounter: Payer: Self-pay | Admitting: Nurse Practitioner

## 2022-01-14 ENCOUNTER — Ambulatory Visit (INDEPENDENT_AMBULATORY_CARE_PROVIDER_SITE_OTHER): Payer: BC Managed Care – PPO | Admitting: Nurse Practitioner

## 2022-01-14 VITALS — BP 138/76 | HR 81 | Temp 96.6°F | Ht 69.0 in | Wt 381.0 lb

## 2022-01-14 DIAGNOSIS — E785 Hyperlipidemia, unspecified: Secondary | ICD-10-CM

## 2022-01-14 DIAGNOSIS — Z1211 Encounter for screening for malignant neoplasm of colon: Secondary | ICD-10-CM | POA: Diagnosis not present

## 2022-01-14 DIAGNOSIS — I10 Essential (primary) hypertension: Secondary | ICD-10-CM | POA: Diagnosis not present

## 2022-01-14 DIAGNOSIS — E1169 Type 2 diabetes mellitus with other specified complication: Secondary | ICD-10-CM | POA: Diagnosis not present

## 2022-01-14 DIAGNOSIS — Z86010 Personal history of colonic polyps: Secondary | ICD-10-CM | POA: Diagnosis not present

## 2022-01-14 MED ORDER — ATORVASTATIN CALCIUM 20 MG PO TABS: 20.0000 mg | ORAL_TABLET | ORAL | 4 refills | Status: DC

## 2022-01-14 MED ORDER — SPIRONOLACTONE 25 MG PO TABS: 25.0000 mg | ORAL_TABLET | Freq: Every day | ORAL | 1 refills | Status: DC

## 2022-01-14 NOTE — Progress Notes (Signed)
Established Patient Visit  Patient: Brent Wallace   DOB: April 12, 1961   61 y.o. Male  MRN: [id number] Visit Date: 01/14/2022  Subjective:    Chief Complaint  Patient presents with   Office Visit    HTN/ Hyperlipidemia F/u Checks BP every other week  No concerns Requesting records for eye exam    HPI Hypertension BP at goal Home BP 130s/80s-90s BP Readings from Last 3 Encounters:  01/14/22 138/76  10/08/21 138/76  09/03/21 (!) 144/92   Maintain current medications    Reviewed medical, surgical, and social history today  Medications: Outpatient Medications Prior to Visit  Medication Sig   carvedilol (COREG) 3.125 MG tablet TAKE 1 TABLET BY MOUTH TWICE A DAY WITH MEAL   cholecalciferol (VITAMIN D) 1000 units tablet Take 2,000 Units by mouth daily.   Continuous Blood Gluc Sensor (FREESTYLE LIBRE 3 SENSOR) MISC 1 each by Does not apply route every 14 (fourteen) days.   DULoxetine (CYMBALTA) 60 MG capsule Take 1 capsule (60 mg total) by mouth 2 (two) times daily.   glucose blood test strip 1 each by Other route in the morning, at noon, and at bedtime. One Touch Ultra   Insulin Aspart FlexPen (NOVOLOG) 100 UNIT/ML INJECT 20-30 UNITS UNDER THE SKIN WITH BREAKFAST, LUNCH AND EVENING MEAL   Insulin Pen Needle (BD PEN NEEDLE NANO 2ND GEN) 32G X 4 MM MISC USE AS DIRECTED EVERY DAY   metFORMIN (GLUCOPHAGE-XR) 500 MG 24 hr tablet Take 1 tablet (500 mg total) by mouth 2 (two) times daily.   naproxen sodium (ALEVE) 220 MG tablet Take 220 mg by mouth.   olmesartan-hydrochlorothiazide (BENICAR HCT) 40-12.5 MG tablet Take 1 tablet by mouth daily.   ONETOUCH DELICA LANCETS 25E MISC Use 2x a day   OZEMPIC, 2 MG/DOSE, 8 MG/3ML SOPN INJECT '2MG'$  INTO THE SKIN ONCE A WEEK.   sildenafil (VIAGRA) 100 MG tablet Take 0.5-1 tab 30 minutes prior to sexual activity as needed.   [DISCONTINUED] spironolactone (ALDACTONE) 25 MG tablet Take 1 tablet (25 mg total) by mouth daily.   [DISCONTINUED]  atorvastatin (LIPITOR) 20 MG tablet Take 1 tablet (20 mg total) by mouth 3 (three) times a week. (Patient not taking: Reported on 01/14/2022)   No facility-administered medications prior to visit.   Reviewed past medical and social history.   ROS per HPI above      Objective:  BP 138/76 (BP Location: Left Arm, Patient Position: Sitting, Cuff Size: Normal)   Pulse 81   Temp (!) 96.6 F (35.9 C) (Temporal)   Ht '5\' 9"'$  (1.753 m)   Wt (!) 381 lb (172.8 kg)   SpO2 92%   BMI 56.26 kg/m      Physical Exam Cardiovascular:     Rate and Rhythm: Normal rate and regular rhythm.     Pulses: Normal pulses.     Heart sounds: Normal heart sounds.  Pulmonary:     Effort: Pulmonary effort is normal.     Breath sounds: Normal breath sounds.  Neurological:     Mental Status: He is alert and oriented to person, place, and time.     No results found for any visits on 01/14/22.    Assessment & Plan:    Problem List Items Addressed This Visit       Cardiovascular and Mediastinum   Hypertension - Primary    BP at goal Home BP 130s/80s-90s BP Readings  from Last 3 Encounters:  01/14/22 138/76  10/08/21 138/76  09/03/21 (!) 144/92   Maintain current medications      Relevant Medications   atorvastatin (LIPITOR) 20 MG tablet   spironolactone (ALDACTONE) 25 MG tablet     Endocrine   Hyperlipidemia associated with type 2 diabetes mellitus (HCC)   Relevant Medications   atorvastatin (LIPITOR) 20 MG tablet   spironolactone (ALDACTONE) 25 MG tablet   Other Visit Diagnoses     Colon cancer screening       Relevant Orders   Ambulatory referral to Gastroenterology   Hx of colonic polyp       Relevant Orders   Ambulatory referral to Gastroenterology      Return in about 6 months (around 07/17/2022) for HTN.     Wilfred Lacy, NP

## 2022-01-14 NOTE — Patient Instructions (Signed)
Maintain current medications  

## 2022-01-14 NOTE — Assessment & Plan Note (Addendum)
BP at goal Home BP 130s/80s-90s BP Readings from Last 3 Encounters:  01/14/22 138/76  10/08/21 138/76  09/03/21 (!) 144/92   Maintain current medications

## 2022-01-21 ENCOUNTER — Other Ambulatory Visit: Payer: Self-pay | Admitting: Pulmonary Disease

## 2022-01-28 LAB — HM DIABETES EYE EXAM

## 2022-03-22 ENCOUNTER — Ambulatory Visit: Payer: BC Managed Care – PPO | Admitting: Internal Medicine

## 2022-03-22 ENCOUNTER — Encounter: Payer: Self-pay | Admitting: Internal Medicine

## 2022-03-22 VITALS — BP 140/80 | HR 82 | Ht 69.0 in | Wt 364.2 lb

## 2022-03-22 DIAGNOSIS — Z6841 Body Mass Index (BMI) 40.0 and over, adult: Secondary | ICD-10-CM

## 2022-03-22 DIAGNOSIS — E1169 Type 2 diabetes mellitus with other specified complication: Secondary | ICD-10-CM

## 2022-03-22 DIAGNOSIS — Z23 Encounter for immunization: Secondary | ICD-10-CM

## 2022-03-22 DIAGNOSIS — E785 Hyperlipidemia, unspecified: Secondary | ICD-10-CM

## 2022-03-22 DIAGNOSIS — E1159 Type 2 diabetes mellitus with other circulatory complications: Secondary | ICD-10-CM

## 2022-03-22 DIAGNOSIS — N521 Erectile dysfunction due to diseases classified elsewhere: Secondary | ICD-10-CM

## 2022-03-22 LAB — POCT GLYCOSYLATED HEMOGLOBIN (HGB A1C): Hemoglobin A1C: 8.2 % — AB (ref 4.0–5.6)

## 2022-03-22 MED ORDER — LANTUS SOLOSTAR 100 UNIT/ML ~~LOC~~ SOPN: 14.0000 [IU] | PEN_INJECTOR | Freq: Every day | SUBCUTANEOUS | 99 refills | Status: DC

## 2022-03-22 NOTE — Patient Instructions (Addendum)
Patient Instructions  Please continue: - Metformin XR 500 mg 2x a day, with meals - Novolog 20-28 units 15 min before meals  - Ozempic 2 mg weekly in a.m.   Please add: - Lantus 14-18 units at night  Please return in 4 months.
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
## 2022-03-25 ENCOUNTER — Other Ambulatory Visit: Payer: Self-pay | Admitting: Internal Medicine

## 2022-04-12 ENCOUNTER — Other Ambulatory Visit: Payer: Self-pay | Admitting: Nurse Practitioner

## 2022-04-12 DIAGNOSIS — E1169 Type 2 diabetes mellitus with other specified complication: Secondary | ICD-10-CM

## 2022-04-12 NOTE — Telephone Encounter (Signed)
Chart supports Rx Last OV: 01/2022 Next OV: 07/2022

## 2022-04-17 ENCOUNTER — Other Ambulatory Visit: Payer: Self-pay | Admitting: Nurse Practitioner

## 2022-04-17 DIAGNOSIS — M5416 Radiculopathy, lumbar region: Secondary | ICD-10-CM

## 2022-04-17 DIAGNOSIS — M25559 Pain in unspecified hip: Secondary | ICD-10-CM

## 2022-04-17 NOTE — Telephone Encounter (Signed)
Chart supports Rx Last OV: 01/2022 Next OV: 07/2022

## 2022-04-22 ENCOUNTER — Other Ambulatory Visit: Payer: Self-pay | Admitting: Internal Medicine

## 2022-04-22 DIAGNOSIS — E1159 Type 2 diabetes mellitus with other circulatory complications: Secondary | ICD-10-CM

## 2022-04-29 ENCOUNTER — Encounter: Payer: Self-pay | Admitting: Internal Medicine

## 2022-04-29 DIAGNOSIS — E1159 Type 2 diabetes mellitus with other circulatory complications: Secondary | ICD-10-CM

## 2022-05-01 ENCOUNTER — Other Ambulatory Visit: Payer: Self-pay | Admitting: Nurse Practitioner

## 2022-05-01 DIAGNOSIS — N529 Male erectile dysfunction, unspecified: Secondary | ICD-10-CM

## 2022-05-01 MED ORDER — SILDENAFIL CITRATE 100 MG PO TABS: ORAL_TABLET | ORAL | 0 refills | Status: AC

## 2022-05-03 ENCOUNTER — Telehealth: Payer: Self-pay

## 2022-05-03 ENCOUNTER — Other Ambulatory Visit (HOSPITAL_COMMUNITY): Payer: Self-pay

## 2022-05-03 NOTE — Telephone Encounter (Signed)
Patient Advocate Encounter   Received notification from [employer] that prior authorization for sildenafil citrate is required.   PA submitted on [id number] Key [id number] Status is pending       Joneen Boers, [place] Patient Advocate Specialist [place] Patient Advocate Team Direct Number: [phone] Fax: [phone]

## 2022-05-06 ENCOUNTER — Other Ambulatory Visit (HOSPITAL_COMMUNITY): Payer: Self-pay

## 2022-05-06 NOTE — Telephone Encounter (Signed)
Pharmacy Patient Advocate Encounter  Prior Authorization for Sildenafil 100 mg has been approved.    PA# [id number] Effective dates: 05/06/2022 through 05/06/2023

## 2022-05-13 ENCOUNTER — Other Ambulatory Visit: Payer: Self-pay | Admitting: Pulmonary Disease

## 2022-05-13 ENCOUNTER — Other Ambulatory Visit: Payer: Self-pay | Admitting: Internal Medicine

## 2022-05-13 DIAGNOSIS — E1159 Type 2 diabetes mellitus with other circulatory complications: Secondary | ICD-10-CM

## 2022-05-13 MED ORDER — TIRZEPATIDE 5 MG/0.5ML ~~LOC~~ SOAJ: 5.0000 mg | SUBCUTANEOUS | 3 refills | Status: DC

## 2022-05-16 ENCOUNTER — Other Ambulatory Visit: Payer: Self-pay | Admitting: Internal Medicine

## 2022-05-16 MED ORDER — OZEMPIC (2 MG/DOSE) 8 MG/3ML ~~LOC~~ SOPN: PEN_INJECTOR | SUBCUTANEOUS | 5 refills | Status: DC

## 2022-05-17 ENCOUNTER — Other Ambulatory Visit: Payer: Self-pay

## 2022-05-17 DIAGNOSIS — E1159 Type 2 diabetes mellitus with other circulatory complications: Secondary | ICD-10-CM

## 2022-05-17 MED ORDER — OZEMPIC (2 MG/DOSE) 8 MG/3ML ~~LOC~~ SOPN: PEN_INJECTOR | SUBCUTANEOUS | 5 refills | Status: DC

## 2022-05-17 NOTE — Telephone Encounter (Signed)
Pt's wife called to request a PA for Novolog via CoverMyMeds

## 2022-05-20 ENCOUNTER — Telehealth: Payer: Self-pay

## 2022-05-20 ENCOUNTER — Other Ambulatory Visit (HOSPITAL_COMMUNITY): Payer: Self-pay

## 2022-05-20 NOTE — Telephone Encounter (Signed)
Initiating PA for Novolog now. Updating chart notes with encounter. See updates there.

## 2022-05-20 NOTE — Telephone Encounter (Signed)
ERROR

## 2022-05-20 NOTE — Telephone Encounter (Signed)
Ran test claim and insurance prefers Humalog and Insulin Lispro. Please advise. If Dr. Delano Metz want to change to formulary, we will continue with PA process on Novolog.

## 2022-05-27 DIAGNOSIS — M160 Bilateral primary osteoarthritis of hip: Secondary | ICD-10-CM | POA: Diagnosis not present

## 2022-05-27 DIAGNOSIS — Z7985 Long-term (current) use of injectable non-insulin antidiabetic drugs: Secondary | ICD-10-CM | POA: Diagnosis not present

## 2022-05-27 DIAGNOSIS — M461 Sacroiliitis, not elsewhere classified: Secondary | ICD-10-CM | POA: Diagnosis not present

## 2022-05-27 DIAGNOSIS — M1909 Primary osteoarthritis, other specified site: Secondary | ICD-10-CM | POA: Diagnosis not present

## 2022-05-29 ENCOUNTER — Other Ambulatory Visit: Payer: Self-pay | Admitting: Nurse Practitioner

## 2022-05-29 DIAGNOSIS — E1159 Type 2 diabetes mellitus with other circulatory complications: Secondary | ICD-10-CM

## 2022-05-29 NOTE — Telephone Encounter (Signed)
Chart supports Rx Last OV: 01/2022 Next OV: 07/2022

## 2022-07-04 ENCOUNTER — Telehealth: Payer: Self-pay | Admitting: Pulmonary Disease

## 2022-07-05 NOTE — Telephone Encounter (Signed)
Called and left voicemail that patient is needing an office visit with Dr Brent Wallace or an NP before we can send in order for cpap supplies. Last seen in 2022. When he calls back please make follow up appointment.

## 2022-07-17 NOTE — Telephone Encounter (Signed)
Patient is scheduled to see RA on 2/26 at 215. Will close encounter.

## 2022-07-22 ENCOUNTER — Encounter: Payer: Self-pay | Admitting: Nurse Practitioner

## 2022-07-22 ENCOUNTER — Ambulatory Visit (INDEPENDENT_AMBULATORY_CARE_PROVIDER_SITE_OTHER): Payer: BC Managed Care – PPO | Admitting: Nurse Practitioner

## 2022-07-22 VITALS — BP 110/68 | HR 63 | Temp 97.6°F | Resp 16 | Ht 69.0 in | Wt 345.0 lb

## 2022-07-22 DIAGNOSIS — M5416 Radiculopathy, lumbar region: Secondary | ICD-10-CM

## 2022-07-22 DIAGNOSIS — F3342 Major depressive disorder, recurrent, in full remission: Secondary | ICD-10-CM

## 2022-07-22 DIAGNOSIS — E1159 Type 2 diabetes mellitus with other circulatory complications: Secondary | ICD-10-CM | POA: Diagnosis not present

## 2022-07-22 DIAGNOSIS — N521 Erectile dysfunction due to diseases classified elsewhere: Secondary | ICD-10-CM

## 2022-07-22 DIAGNOSIS — M48061 Spinal stenosis, lumbar region without neurogenic claudication: Secondary | ICD-10-CM | POA: Diagnosis not present

## 2022-07-22 DIAGNOSIS — Z125 Encounter for screening for malignant neoplasm of prostate: Secondary | ICD-10-CM | POA: Diagnosis not present

## 2022-07-22 DIAGNOSIS — Z6841 Body Mass Index (BMI) 40.0 and over, adult: Secondary | ICD-10-CM

## 2022-07-22 DIAGNOSIS — E785 Hyperlipidemia, unspecified: Secondary | ICD-10-CM | POA: Diagnosis not present

## 2022-07-22 DIAGNOSIS — M25551 Pain in right hip: Secondary | ICD-10-CM

## 2022-07-22 DIAGNOSIS — I10 Essential (primary) hypertension: Secondary | ICD-10-CM | POA: Diagnosis not present

## 2022-07-22 DIAGNOSIS — M25552 Pain in left hip: Secondary | ICD-10-CM

## 2022-07-22 DIAGNOSIS — E1169 Type 2 diabetes mellitus with other specified complication: Secondary | ICD-10-CM | POA: Diagnosis not present

## 2022-07-22 MED ORDER — DULOXETINE HCL 60 MG PO CPEP: 60.0000 mg | ORAL_CAPSULE | Freq: Every day | ORAL | 1 refills | Status: DC

## 2022-07-22 MED ORDER — OZEMPIC (2 MG/DOSE) 8 MG/3ML ~~LOC~~ SOPN: PEN_INJECTOR | SUBCUTANEOUS | 0 refills | Status: DC

## 2022-07-22 NOTE — Assessment & Plan Note (Signed)
BP at goal with coreg, benicar/hctz, and spironolactone. Home BP: 120s/70s-80s Occassional low BP after taking CBD gummies for pain mangagement: 90s-100s/70s. BP Readings from Last 3 Encounters:  07/22/22 110/68  03/22/22 (!) 140/80  01/14/22 138/76    Maintain med dose Consider med dose modifications if becomes hypotensive Advised to send BP readings via mychart F/up in 3-53month

## 2022-07-22 NOTE — Assessment & Plan Note (Addendum)
Stable and controlled pain with cymbalta 65m daily and Delta 9 gummy at hs. Denies need for NSAIDs

## 2022-07-22 NOTE — Progress Notes (Signed)
Established Patient Visit  Patient: Brent Wallace   DOB: [birth year]   62 y.o. Male  MRN: [id number] Visit Date: 07/22/2022  Subjective:    Chief Complaint  Patient presents with   Office visit     Htn-  Numbers are going down    HPI Hypertension BP at goal with coreg, benicar/hctz, and spironolactone. Home BP: 120s/70s-80s Occassional low BP after taking CBD gummies for pain mangagement: 90s-100s/70s. BP Readings from Last 3 Encounters:  07/22/22 110/68  03/22/22 (!) 140/80  01/14/22 138/76    Maintain med dose Consider med dose modifications if becomes hypotensive Advised to send BP readings via mychart F/up in 3-3month  Type 2 diabetes mellitus with circulatory disorder causing erectile dysfunction ([facility] Needs ozempic rx sent to retail pharmacy He will contacted Dr. GLetta Medianfor additional refills  Hyperlipidemia associated with type 2 diabetes mellitus ([place] Repeat lipid panel  Spinal stenosis of lumbar region with radiculopathy Stable and controlled pain with cymbalta 660mdaily and Delta 9 gummy at hs. Denies need for NSAIDs  Depression, recurrent (HCC) Stable mood with cymbalta 6068maily  Class 3 severe obesity due to excess calories with serious comorbidity and body mass index (BMI) of 50.0 to 59.9 in adult ([facility] Weight loss noted in last 86mo62monthth help of ozempic, low fat/low carb/low sodium diet and increased daily activity. Wt Readings from Last 3 Encounters:  07/22/22 (!) 345 lb (156.5 kg)  03/22/22 (!) 364 lb 3.2 oz (165.2 kg)  01/14/22 (!) 381 lb (172.8 kg)       Reviewed medical, surgical, and social history today  Medications: Outpatient Medications Prior to Visit  Medication Sig   atorvastatin (LIPITOR) 20 MG tablet TAKE 1 TABLET BY MOUTH 3 TIMES A WEEK.   carvedilol (COREG) 3.125 MG tablet TAKE 1 TABLET BY MOUTH TWICE A DAY WITH MEAL   cholecalciferol (VITAMIN D) 1000 units tablet Take 2,000 Units by mouth daily.    Continuous Blood Gluc Sensor (FREESTYLE LIBRE 3 SENSOR) MISC 1 each by Does not apply route every 14 (fourteen) days.   glucose blood test strip 1 each by Other route in the morning, at noon, and at bedtime. One Touch Ultra   insulin aspart (NOVOLOG FLEXPEN) 100 UNIT/ML FlexPen Inject 20-30 Units into the skin 3 (three) times daily with meals.   insulin glargine (LANTUS SOLOSTAR) 100 UNIT/ML Solostar Pen Inject 14-18 Units into the skin at bedtime.   Insulin Pen Needle (BD PEN NEEDLE NANO 2ND GEN) 32G X 4 MM MISC USE AS DIRECTED EVERY DAY   metFORMIN (GLUCOPHAGE-XR) 500 MG 24 hr tablet TAKE 1 TABLET BY MOUTH TWICE A DAY   olmesartan-hydrochlorothiazide (BENICAR HCT) 40-12.5 MG tablet Take 1 tablet by mouth daily.   ONETOUCH DELICA LANCETS 33G 99991111C Use 2x a day   sildenafil (VIAGRA) 100 MG tablet Take 0.5-1 tab 30 minutes prior to sexual activity as needed.   spironolactone (ALDACTONE) 25 MG tablet Take 1 tablet (25 mg total) by mouth daily.   [DISCONTINUED] DULoxetine (CYMBALTA) 60 MG capsule TAKE 1 CAPSULE BY MOUTH 2 TIMES DAILY.   [DISCONTINUED] naproxen sodium (ALEVE) 220 MG tablet Take 220 mg by mouth.   [DISCONTINUED] Semaglutide, 2 MG/DOSE, (OZEMPIC, 2 MG/DOSE,) 8 MG/3ML SOPN INJECT 2 MG INTO THE SKIN ONCE A WEEK   [DISCONTINUED] tirzepatide (MOUNJARO) 5 MG/0.5ML Pen Inject 5 mg into the skin once a week. (Patient not taking: Reported on  07/22/2022)   No facility-administered medications prior to visit.   Reviewed past medical and social history.   ROS per HPI above      Objective:  BP 110/68   Pulse 63   Temp 97.6 F (36.4 C) (Temporal)   Resp 16   Ht 5' 9"$  (1.753 m)   Wt (!) 345 lb (156.5 kg)   SpO2 98%   BMI 50.95 kg/m      Physical Exam  No results found for any visits on 07/22/22.    Assessment & Plan:    Problem List Items Addressed This Visit       Cardiovascular and Mediastinum   Hypertension - Primary    BP at goal with coreg, benicar/hctz, and  spironolactone. Home BP: 120s/70s-80s Occassional low BP after taking CBD gummies for pain mangagement: 90s-100s/70s. BP Readings from Last 3 Encounters:  07/22/22 110/68  03/22/22 (!) 140/80  01/14/22 138/76    Maintain med dose Consider med dose modifications if becomes hypotensive Advised to send BP readings via mychart F/up in 3-53month      Relevant Orders   Renal Function Panel   Type 2 diabetes mellitus with circulatory disorder causing erectile dysfunction (HCC)    Needs ozempic rx sent to retail pharmacy He will contacted Dr. GLetta Medianfor additional refills      Relevant Medications   Semaglutide, 2 MG/DOSE, (OZEMPIC, 2 MG/DOSE,) 8 MG/3ML SOPN   Other Relevant Orders   Renal Function Panel   Microalbumin / creatinine urine ratio   Hepatic function panel     Endocrine   Hyperlipidemia associated with type 2 diabetes mellitus ([place]    Repeat lipid panel      Relevant Medications   Semaglutide, 2 MG/DOSE, (OZEMPIC, 2 MG/DOSE,) 8 MG/3ML SOPN   Other Relevant Orders   Hepatic function panel   Lipid panel     Nervous and Auditory   Spinal stenosis of lumbar region with radiculopathy    Stable and controlled pain with cymbalta 6342mdaily and Delta 9 gummy at hs. Denies need for NSAIDs      Relevant Medications   DULoxetine (CYMBALTA) 60 MG capsule     Other   Class 3 severe obesity due to excess calories with serious comorbidity and body mass index (BMI) of 50.0 to 59.9 in adult ([facility]   19lbs Weight loss noted in last 42m81monthith help of ozempic, low fat/low carb/low sodium diet and increased daily activity. Wt Readings from Last 3 Encounters:  07/22/22 (!) 345 lb (156.5 kg)  03/22/22 (!) 364 lb 3.2 oz (165.2 kg)  01/14/22 (!) 381 lb (172.8 kg)         Relevant Medications   Semaglutide, 2 MG/DOSE, (OZEMPIC, 2 MG/DOSE,) 8 MG/3ML SOPN   Depression, recurrent (HCC)    Stable mood with cymbalta 10m20mily      Relevant Medications   DULoxetine  (CYMBALTA) 60 MG capsule   Other Visit Diagnoses     Hip pain       Relevant Medications   DULoxetine (CYMBALTA) 60 MG capsule   Prostate cancer screening       Relevant Orders   PSA, Medicare      Return in about 6 months (around 01/20/2023) for HTN, hyperlipidemia (fasting).     CharWilfred Lacy

## 2022-07-22 NOTE — Assessment & Plan Note (Signed)
Stable mood with cymbalta 12m daily

## 2022-07-22 NOTE — Assessment & Plan Note (Signed)
19lbs Weight loss noted in last 22month with help of ozempic, low fat/low carb/low sodium diet and increased daily activity. Wt Readings from Last 3 Encounters:  07/22/22 (!) 345 lb (156.5 kg)  03/22/22 (!) 364 lb 3.2 oz (165.2 kg)  01/14/22 (!) 381 lb (172.8 kg)

## 2022-07-22 NOTE — Patient Instructions (Signed)
Go to lab Monitor BP 3x/week in AM Send BP reading via mychart in 2weeks Schedule appt for colonoscopy Schedule appt for welcome to medicare

## 2022-07-22 NOTE — Assessment & Plan Note (Signed)
Needs ozempic rx sent to retail pharmacy He will contacted Dr. Letta Median for additional refills

## 2022-07-22 NOTE — Assessment & Plan Note (Signed)
Repeat lipid panel ?

## 2022-07-23 LAB — RENAL FUNCTION PANEL
Albumin: 4.2 g/dL (ref 3.5–5.2)
BUN: 14 mg/dL (ref 6–23)
CO2: 29 mEq/L (ref 19–32)
Calcium: 9.8 mg/dL (ref 8.4–10.5)
Chloride: 96 mEq/L (ref 96–112)
Creatinine, Ser: 0.89 mg/dL (ref 0.40–1.50)
GFR: 91.5 mL/min (ref >60.00)
Glucose, Bld: 164 mg/dL — ABNORMAL HIGH (ref 70–99)
Phosphorus: 3 mg/dL (ref 2.3–4.6)
Potassium: 4.1 mEq/L (ref 3.5–5.1)
Sodium: 135 mEq/L (ref 135–145)

## 2022-07-23 LAB — LIPID PANEL
Cholesterol: 115 mg/dL (ref 0–200)
HDL: 49.9 mg/dL (ref >39.00)
LDL Cholesterol: 44 mg/dL (ref 0–99)
NonHDL: 64.76
Total CHOL/HDL Ratio: 2
Triglycerides: 102 mg/dL (ref 0.0–149.0)
VLDL: 20.4 mg/dL (ref 0.0–40.0)

## 2022-07-23 LAB — HEPATIC FUNCTION PANEL
ALT: 15 U/L (ref 0–53)
AST: 15 U/L (ref 0–37)
Albumin: 4.2 g/dL (ref 3.5–5.2)
Alkaline Phosphatase: 63 U/L (ref 39–117)
Bilirubin, Direct: 0.1 mg/dL (ref 0.0–0.3)
Total Bilirubin: 0.5 mg/dL (ref 0.2–1.2)
Total Protein: 7.1 g/dL (ref 6.0–8.3)

## 2022-07-23 LAB — MICROALBUMIN / CREATININE URINE RATIO
Creatinine,U: 92.1 mg/dL
Microalb Creat Ratio: 11.5 mg/g (ref 0.0–30.0)
Microalb, Ur: 10.6 mg/dL — ABNORMAL HIGH (ref 0.0–1.9)

## 2022-07-23 LAB — PSA, MEDICARE: PSA: 0.86 ng/ml (ref 0.10–4.00)

## 2022-07-23 NOTE — Progress Notes (Signed)
Stable Follow instructions as discussed during office visit.

## 2022-08-01 ENCOUNTER — Other Ambulatory Visit: Payer: Self-pay | Admitting: Internal Medicine

## 2022-08-02 ENCOUNTER — Telehealth: Payer: Self-pay

## 2022-08-02 ENCOUNTER — Other Ambulatory Visit: Payer: Self-pay

## 2022-08-02 DIAGNOSIS — E1159 Type 2 diabetes mellitus with other circulatory complications: Secondary | ICD-10-CM

## 2022-08-02 MED ORDER — OZEMPIC (2 MG/DOSE) 8 MG/3ML ~~LOC~~ SOPN: PEN_INJECTOR | SUBCUTANEOUS | 0 refills | Status: DC

## 2022-08-02 NOTE — Progress Notes (Signed)
   08/02/2022  Patient ID: Brent Wallace, male   DOB: [birth year], 62 y.o.   MRN: [id number]  Received response from appointment scheduling request I sent via [place].  [facility]  CWilfred Lacy(PCP) [facility] [place] [facility] to get patient through.  He has an appointment with Endocrinology 08/05/22; I messaged Dr. GCruzita Ledererrequesting a new [facility].  MyChart response sent to notify patient this has been requested and asked that he call me to scheduled appointment for medication review and check in on BP and BG.    CDarlina Guys PharmD, DPLA

## 2022-08-05 ENCOUNTER — Ambulatory Visit (INDEPENDENT_AMBULATORY_CARE_PROVIDER_SITE_OTHER): Payer: BC Managed Care – PPO | Admitting: Pulmonary Disease

## 2022-08-05 ENCOUNTER — Encounter: Payer: Self-pay | Admitting: Internal Medicine

## 2022-08-05 ENCOUNTER — Encounter (HOSPITAL_BASED_OUTPATIENT_CLINIC_OR_DEPARTMENT_OTHER): Payer: Self-pay | Admitting: Pulmonary Disease

## 2022-08-05 ENCOUNTER — Ambulatory Visit (INDEPENDENT_AMBULATORY_CARE_PROVIDER_SITE_OTHER): Payer: BC Managed Care – PPO | Admitting: Internal Medicine

## 2022-08-05 VITALS — BP 122/72 | HR 84 | Ht 69.0 in | Wt 352.2 lb

## 2022-08-05 VITALS — BP 124/72 | HR 96 | Ht 69.0 in | Wt 354.9 lb

## 2022-08-05 DIAGNOSIS — E1159 Type 2 diabetes mellitus with other circulatory complications: Secondary | ICD-10-CM

## 2022-08-05 DIAGNOSIS — E1169 Type 2 diabetes mellitus with other specified complication: Secondary | ICD-10-CM

## 2022-08-05 DIAGNOSIS — Z87891 Personal history of nicotine dependence: Secondary | ICD-10-CM

## 2022-08-05 DIAGNOSIS — N521 Erectile dysfunction due to diseases classified elsewhere: Secondary | ICD-10-CM | POA: Diagnosis not present

## 2022-08-05 DIAGNOSIS — E785 Hyperlipidemia, unspecified: Secondary | ICD-10-CM

## 2022-08-05 DIAGNOSIS — Z6841 Body Mass Index (BMI) 40.0 and over, adult: Secondary | ICD-10-CM

## 2022-08-05 DIAGNOSIS — G4733 Obstructive sleep apnea (adult) (pediatric): Secondary | ICD-10-CM

## 2022-08-05 LAB — POCT GLYCOSYLATED HEMOGLOBIN (HGB A1C): Hemoglobin A1C: 7.8 % — AB (ref 4.0–5.6)

## 2022-08-05 MED ORDER — OZEMPIC (2 MG/DOSE) 8 MG/3ML ~~LOC~~ SOPN: PEN_INJECTOR | SUBCUTANEOUS | 3 refills | Status: DC
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
## 2022-08-05 NOTE — Assessment & Plan Note (Signed)
He would be eligible for replacement CPAP machine.  Will send in prescription for auto CPAP 15 to 20 cm, obtain download and tweak settings as needed.  Is possible that his pressure requirements have changed.  He has lost 20 pounds since his last visit. We also discussed various types of masks for him and he prefers AirFit F20.  I also asked him to try AirFit F30 in the future  Weight loss encouraged, compliance with goal of at least 4-6 hrs every night is the expectation. Advised against medications with sedative side effects Cautioned against driving when sleepy - understanding that sleepiness will vary on a day to day basis

## 2022-08-05 NOTE — Patient Instructions (Signed)
xRX for auto CPAP 15 to 20 cm , with an AirFit F20 fullface mask to [facility]  CT scan was clear

## 2022-08-05 NOTE — Assessment & Plan Note (Signed)
We reviewed low-dose CT chest which was normal

## 2022-08-05 NOTE — Progress Notes (Signed)
   Subjective:    Patient ID: Brent Wallace, male    DOB: [birth year], 62 y.o.   MRN: [id number]  HPI  62 yo for FU of severe OSA   Chief Complaint  Patient presents with   Follow-up    States he has been doing good since last visit and denies any complaints.   We last saw him in December 2022, prescription was written for new replacement auto CPAP but since he had residual pain went to DME, he never received this machine. He has now caught up with his payments and is ready to obtain a new machine. He has been maintained on CPAP of 17 cm based on previous download and has tolerated this well.  In fact he does not need functionality and likes the high pressure. He has bilateral hip pain due to severe osteoarthritis and has been recommended surgery once he is able to lose weight to 270 pounds.  He has lost about 20 pounds to his current weight of 354 He is accompanied by his wife who corroborates history  He has come off pain meds and takes CBD Gummies.  This also helps him sleep He is very compliant with his machine and denies any problems with mask or pressure.  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   Review of Systems neg for any significant sore throat, dysphagia, itching, sneezing, nasal congestion or excess/ purulent secretions, fever, chills, sweats, unintended wt loss, pleuritic or exertional cp, hempoptysis, orthopnea pnd or change in chronic leg swelling. Also denies presyncope, palpitations, heartburn, abdominal pain, nausea, vomiting, diarrhea or change in bowel or urinary habits, dysuria,hematuria, rash, arthralgias, visual complaints, headache, numbness weakness or ataxia.     Objective:   Physical Exam  Gen. Pleasant, obese, in no distress ENT - no lesions, no post nasal drip Neck: No JVD, no thyromegaly, no carotid  bruits Lungs: no use of accessory muscles, no dullness to percussion, decreased without rales or rhonchi  Cardiovascular: Rhythm regular, heart sounds  normal, no murmurs or gallops, no peripheral edema Musculoskeletal: No deformities, no cyanosis or clubbing , no tremors       Assessment & Plan:

## 2022-08-05 NOTE — Patient Instructions (Addendum)
Please continue: - Metformin XR 500 mg 2x a day, with meals - Ozempic 2 mg weekly in a.m.   Please change: - Novolog 10-20 units 15 min before meals   Restart: - Lantus 14(-18) units at night  Please return in 4 months.

## 2022-08-08 ENCOUNTER — Other Ambulatory Visit: Payer: Self-pay

## 2022-08-08 ENCOUNTER — Encounter: Payer: Self-pay | Admitting: Internal Medicine

## 2022-08-08 DIAGNOSIS — E1159 Type 2 diabetes mellitus with other circulatory complications: Secondary | ICD-10-CM

## 2022-08-08 MED ORDER — OZEMPIC (2 MG/DOSE) 8 MG/3ML ~~LOC~~ SOPN: PEN_INJECTOR | SUBCUTANEOUS | 3 refills | Status: DC

## 2022-08-13 DIAGNOSIS — G4733 Obstructive sleep apnea (adult) (pediatric): Secondary | ICD-10-CM | POA: Diagnosis not present

## 2022-08-18 ENCOUNTER — Emergency Department (HOSPITAL_COMMUNITY): Payer: BC Managed Care – PPO

## 2022-08-18 ENCOUNTER — Emergency Department (HOSPITAL_COMMUNITY)
Admission: EM | Admit: 2022-08-18 | Discharge: 2022-08-18 | Disposition: A | Discharge status: Home / Self Care | Payer: BC Managed Care – PPO | Attending: Emergency Medicine | Admitting: Emergency Medicine

## 2022-08-18 ENCOUNTER — Other Ambulatory Visit: Payer: Self-pay

## 2022-08-18 DIAGNOSIS — R109 Unspecified abdominal pain: Secondary | ICD-10-CM | POA: Diagnosis not present

## 2022-08-18 DIAGNOSIS — Z7984 Long term (current) use of oral hypoglycemic drugs: Secondary | ICD-10-CM | POA: Diagnosis not present

## 2022-08-18 DIAGNOSIS — E119 Type 2 diabetes mellitus without complications: Secondary | ICD-10-CM | POA: Diagnosis not present

## 2022-08-18 DIAGNOSIS — I10 Essential (primary) hypertension: Secondary | ICD-10-CM | POA: Diagnosis not present

## 2022-08-18 DIAGNOSIS — R140 Abdominal distension (gaseous): Secondary | ICD-10-CM | POA: Diagnosis not present

## 2022-08-18 DIAGNOSIS — R1084 Generalized abdominal pain: Secondary | ICD-10-CM | POA: Diagnosis not present

## 2022-08-18 DIAGNOSIS — Z79899 Other long term (current) drug therapy: Secondary | ICD-10-CM | POA: Diagnosis not present

## 2022-08-18 DIAGNOSIS — R079 Chest pain, unspecified: Secondary | ICD-10-CM | POA: Diagnosis not present

## 2022-08-18 DIAGNOSIS — Z794 Long term (current) use of insulin: Secondary | ICD-10-CM | POA: Diagnosis not present

## 2022-08-18 DIAGNOSIS — J929 Pleural plaque without asbestos: Secondary | ICD-10-CM | POA: Diagnosis not present

## 2022-08-18 LAB — CBC WITH DIFFERENTIAL/PLATELET
Abs Immature Granulocytes: 0.01 10*3/uL (ref 0.00–0.07)
Basophils Absolute: 0.1 10*3/uL (ref 0.0–0.1)
Basophils Relative: 1 %
Eosinophils Absolute: 0.2 10*3/uL (ref 0.0–0.5)
Eosinophils Relative: 2 %
HCT: 51 % (ref 39.0–52.0)
Hemoglobin: 16.3 g/dL (ref 13.0–17.0)
Immature Granulocytes: 0 %
Lymphocytes Relative: 31 %
Lymphs Abs: 2.8 10*3/uL (ref 0.7–4.0)
MCH: 29.5 pg (ref 26.0–34.0)
MCHC: 32 g/dL (ref 30.0–36.0)
MCV: 92.4 fL (ref 80.0–100.0)
Monocytes Absolute: 0.8 10*3/uL (ref 0.1–1.0)
Monocytes Relative: 9 %
Neutro Abs: 5.3 10*3/uL (ref 1.7–7.7)
Neutrophils Relative %: 57 %
Platelets: 206 10*3/uL (ref 150–400)
RBC: 5.52 MIL/uL (ref 4.22–5.81)
RDW: 13.2 % (ref 11.5–15.5)
WBC: 9.2 10*3/uL (ref 4.0–10.5)
nRBC: 0 % (ref 0.0–0.2)

## 2022-08-18 LAB — COMPREHENSIVE METABOLIC PANEL
ALT: 15 U/L (ref 0–44)
AST: 16 U/L (ref 15–41)
Albumin: 3.8 g/dL (ref 3.5–5.0)
Alkaline Phosphatase: 62 U/L (ref 38–126)
Anion gap: 11 (ref 5–15)
BUN: 10 mg/dL (ref 8–23)
CO2: 27 mmol/L (ref 22–32)
Calcium: 9.3 mg/dL (ref 8.9–10.3)
Chloride: 96 mmol/L — ABNORMAL LOW (ref 98–111)
Creatinine, Ser: 0.76 mg/dL (ref 0.61–1.24)
GFR, Estimated: >60 mL/min (ref >60)
Glucose, Bld: 106 mg/dL — ABNORMAL HIGH (ref 70–99)
Potassium: 3.9 mmol/L (ref 3.5–5.1)
Sodium: 134 mmol/L — ABNORMAL LOW (ref 135–145)
Total Bilirubin: 1.1 mg/dL (ref 0.3–1.2)
Total Protein: 7.7 g/dL (ref 6.5–8.1)

## 2022-08-18 LAB — URINALYSIS, ROUTINE W REFLEX MICROSCOPIC
Bilirubin Urine: NEGATIVE
Glucose, UA: NEGATIVE mg/dL
Hgb urine dipstick: NEGATIVE
Ketones, ur: NEGATIVE mg/dL
Leukocytes,Ua: NEGATIVE
Nitrite: NEGATIVE
Protein, ur: 30 mg/dL — AB
Specific Gravity, Urine: 1.013 (ref 1.005–1.030)
pH: 6 (ref 5.0–8.0)

## 2022-08-18 LAB — LIPASE, BLOOD: Lipase: 43 U/L (ref 11–51)

## 2022-08-18 LAB — TROPONIN I (HIGH SENSITIVITY): Troponin I (High Sensitivity): 4 ng/L (ref <18)

## 2022-08-18 MED ORDER — IOHEXOL 350 MG/ML SOLN: 100.0000 mL | Freq: Once | INTRAVENOUS | Status: DC | PRN

## 2022-08-18 NOTE — ED Triage Notes (Signed)
Pt reports upper back pain and upper abd pain x 1 week. Pt states abd is tighter and larger than what it normally is

## 2022-08-18 NOTE — ED Notes (Addendum)
Called to lab to inquire about results to Troponin and CMP, talked to labe tech and she stated blood was placed on spinner 5 minutes ago and will result soon.
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
## 2022-08-18 NOTE — ED Provider Notes (Cosign Needed Addendum)
[name] Provider Note   CSN: [id number] Arrival date & time: 08/18/22  1541     History  Chief Complaint  Patient presents with   Back Pain   Abdominal Pain    Brent Wallace is a 62 y.o. male history includes obesity, diverticulosis, diabetes, hypertension, sleep apnea, optic neuritis, spinal stenosis, hyperlipidemia.  Patient presents with his wife today for evaluation of back pain has been going on for 1 week.  Initially pain was on the right mid/upper side of his back and over the course of again is gradually moved and is now over on the left side of his back.  Pain is sharp aching constant mild-moderate intensity has tried Tylenol without significant relief.  Pain does not radiate.  He reports that over the past 1-2 days he has noticed that his abdomen has felt swollen and tight and this is new for him.  Patient reports that he had a sharp abdominal pain when taking a deep breath earlier that has not reoccurred.  No history of DVT/PE, recent surgery/immobilization, history of cancer, extremity swelling, hormone use, vomiting/diarrhea, hemoptysis, cough, saddle paresthesias, bowel/bladder incontinence, retention, extremity numbness/weakness or any additional concerns. HPI     Home Medications Prior to Admission medications   Medication Sig Start Date End Date Taking? Authorizing Provider  atorvastatin (LIPITOR) 20 MG tablet TAKE 1 TABLET BY MOUTH 3 TIMES A WEEK. 04/12/22   Nche, Charlene Brooke, NP  carvedilol (COREG) 3.125 MG tablet TAKE 1 TABLET BY MOUTH TWICE A DAY WITH MEAL 08/02/21   Nche, Charlene Brooke, NP  cholecalciferol (VITAMIN D) 1000 units tablet Take 2,000 Units by mouth daily.    [provider]  Continuous Blood Gluc Sensor (FREESTYLE LIBRE 3 SENSOR) MISC 1 EACH BY DOES NOT APPLY ROUTE EVERY 14 (FOURTEEN) DAYS. 08/01/22   Philemon Kingdom, MD  DULoxetine (CYMBALTA) 60 MG capsule Take 1 capsule (60 mg total) by  mouth daily. 07/22/22   Nche, Charlene Brooke, NP  glucose blood test strip 1 each by Other route in the morning, at noon, and at bedtime. One Touch Ultra 07/31/21   Nche, Charlene Brooke, NP  insulin aspart (NOVOLOG FLEXPEN) 100 UNIT/ML FlexPen Inject 20-30 Units into the skin 3 (three) times daily with meals. 04/22/22   Philemon Kingdom, MD  insulin glargine (LANTUS SOLOSTAR) 100 UNIT/ML Solostar Pen Inject 14-18 Units into the skin at bedtime. 03/22/22   Philemon Kingdom, MD  Insulin Pen Needle (BD PEN NEEDLE NANO 2ND GEN) 32G X 4 MM MISC USE AS DIRECTED EVERY DAY 03/16/21   Philemon Kingdom, MD  metFORMIN (GLUCOPHAGE-XR) 500 MG 24 hr tablet TAKE 1 TABLET BY MOUTH TWICE A DAY 05/29/22   Nche, Charlene Brooke, NP  olmesartan-hydrochlorothiazide (BENICAR HCT) 40-12.5 MG tablet Take 1 tablet by mouth daily. 09/03/21   Nche, Charlene Brooke, NP  [facility] DELICA LANCETS [id number] MISC Use 2x a day 04/27/13   Philemon Kingdom, MD  Semaglutide, 2 MG/DOSE, (OZEMPIC, 2 MG/DOSE,) 8 MG/3ML SOPN INJECT 2 MG INTO THE SKIN ONCE A WEEK 08/08/22   Philemon Kingdom, MD  sildenafil (VIAGRA) 100 MG tablet Take 0.5-1 tab 30 minutes prior to sexual activity as needed. 05/01/22   Nche, Charlene Brooke, NP  spironolactone (ALDACTONE) 25 MG tablet Take 1 tablet (25 mg total) by mouth daily. 01/14/22   Nche, Charlene Brooke, NP      Allergies    Other and Penicillins    Review of Systems   Review of  Systems Ten systems are reviewed and are negative for acute change except as noted in the HPI  Physical Exam Updated Vital Signs BP (!) 151/95 (BP Location: Right Arm)   Pulse 78   Temp 98.4 F (36.9 C) (Oral)   Resp 16   Ht '5\' 9"'$  (1.753 m)   Wt (!) 161 kg   SpO2 96%   BMI 52.42 kg/m  Physical Exam Constitutional:      General: He is not in acute distress.    Appearance: Normal appearance. He is well-developed. He is not ill-appearing or diaphoretic.  HENT:     Head: Normocephalic and atraumatic.  Eyes:     General: Vision  grossly intact. Gaze aligned appropriately.     Pupils: Pupils are equal, round, and reactive to light.  Neck:     Trachea: Trachea and phonation normal.  Pulmonary:     Effort: Pulmonary effort is normal. No respiratory distress.  Abdominal:     General: Abdomen is protuberant. There is distension.     Palpations: Abdomen is soft.     Tenderness: There is generalized abdominal tenderness. There is no guarding or rebound.  Musculoskeletal:        General: Normal range of motion.     Cervical back: Normal range of motion.     Comments: No midline spinal tenderness palpation.  No crepitus step-off or deformity spine.  Mild right parathoracic muscular tenderness palpation.  Skin:    General: Skin is warm and dry.  Neurological:     Mental Status: He is alert.     GCS: GCS eye subscore is 4. GCS verbal subscore is 5. GCS motor subscore is 6.     Comments: Speech is clear and goal oriented, follows commands Major Cranial nerves without deficit, no facial droop Moves extremities without ataxia, coordination intact  Psychiatric:        Behavior: Behavior normal.     ED Results / Procedures / Treatments   Labs (all labs ordered are listed, but only abnormal results are displayed) Labs Reviewed  CBC WITH DIFFERENTIAL/PLATELET  COMPREHENSIVE METABOLIC PANEL  LIPASE, BLOOD  URINALYSIS, ROUTINE W REFLEX MICROSCOPIC  TROPONIN I (HIGH SENSITIVITY)    EKG EKG Interpretation  Date/Time:  Sunday August 18 2022 16:36:59 EDT Ventricular Rate:  79 PR Interval:  202 QRS Duration: 111 QT Interval:  388 QTC Calculation: 445 R Axis:   11 Text Interpretation: Sinus rhythm Atrial premature complex No old tracing to compare Confirmed by Aletta Edouard [phone]) on 08/18/2022 4:38:39 PM  Radiology DG Abdomen 1 View  Result Date: 08/18/2022 CLINICAL DATA:  Abdominal distension EXAM: ABDOMEN - 1 VIEW COMPARISON:  02/16/2015 FINDINGS: Gaseous distension of the stomach and transverse colon.  Moderate volume of stool throughout the colon. No definite small bowel dilation. No gross free intraperitoneal air. Advanced degenerative changes both hips. IMPRESSION: Gaseous distension of the stomach and transverse colon. Moderate volume of stool throughout the colon. Electronically Signed   By: Davina Poke D.O.   On: 08/18/2022 16:41   DG Chest 2 View  Result Date: 08/18/2022 CLINICAL DATA:  Mid back pain EXAM: CHEST - 2 VIEW COMPARISON:  Chest x-ray 12/02/2018 FINDINGS: The heart size and mediastinal contours are within normal limits. Both lungs are clear. The visualized skeletal structures are unremarkable. IMPRESSION: No active cardiopulmonary disease. Electronically Signed   By: Ronney Asters M.D.   On: 08/18/2022 16:41    Procedures Procedures    Medications Ordered in ED Medications - No  data to display  ED Course/ Medical Decision Making/ A&P                             Medical Decision Making Six 45-year-old male history as above presented for evaluation of mid back pain ongoing for the past 1 week, pain is gradually been moving across his back from the right to the left.  This associate with some abdominal pain and distention that has been ongoing for the past few days.  On evaluation he is overall well-appearing and in no acute distress.  He has no midline tenderness on exam.  He has no complaints to suggest cauda equina.  He is somewhat tender to the abdomen and family reports it is distended compared to normal.  He did have 1 brief episode of upper abdominal pain with deep breath that has not reoccurred.  Will obtain labs along with CT dissection study.  Amount and/or Complexity of Data Reviewed Labs: ordered. Radiology: ordered.    Details: I have personally reviewed and interpreted patient's AP abdomen radiograph.  I agree with radiologist that he has distention of the stomach and transverse colon. I personally reviewed and interpreted patient's two-view chest x-ray.  I  do not appreciate any obvious PTX, PNA, effusion or consolidation. ECG/medicine tests:     Details: I have personally reviewed and interpreted patient's twelve-lead EKG. I do not appreciate any obvious acute ischemic changes.  Patient was reassessed, resting comfortably bed no acute distress.  Vital signs stable.  IV started  Care handoff given to Marga Hoots, PA-C at shift change.  Plan of care is to await labs and CT imaging.  Disposition per oncoming team.  Discussed case with Dr. Melina Copa who agrees with plan.  Note: Portions of this report may have been transcribed using voice recognition software. Every effort was made to ensure accuracy; however, inadvertent computerized transcription errors may still be present.         Final Clinical Impression(s) / ED Diagnoses Final diagnoses:  None    Rx / DC Orders ED Discharge Orders     None         Deliah Boston, PA-C 08/18/22 1810    Gari Crown 08/18/22 1821    Hayden Rasmussen, MD 08/19/22 1002

## 2022-08-18 NOTE — Discharge Instructions (Signed)
You are seen in the emergency department for pain in your back flank abdomen.  You had blood work EKG urinalysis and a CAT scan of your chest abdomen and pelvis that did not show any significant abnormalities.  Please continue your regular medications.  You can try increasing your fluid fiber intake and laxative.  Follow-up with your regular doctor.  Return to the emergency department if any worsening or concerning symptoms.

## 2022-08-19 ENCOUNTER — Other Ambulatory Visit: Payer: Self-pay | Admitting: Nurse Practitioner

## 2022-08-19 DIAGNOSIS — I10 Essential (primary) hypertension: Secondary | ICD-10-CM

## 2022-08-20 ENCOUNTER — Telehealth: Payer: Self-pay

## 2022-08-20 NOTE — Transitions of Care (Post Inpatient/ED Visit) (Signed)
   08/20/2022  Name: Brent Wallace MRN: [id number] DOB: 05/20/1961  Today's TOC FU Call Status: Today's TOC FU Call Status:: Unsuccessul Call (1st Attempt) Unsuccessful Call (1st Attempt) Date: 08/20/22  Attempted to reach the patient regarding the most recent Inpatient/ED visit.  Follow Up Plan: Additional outreach attempts will be made to reach the patient to complete the Transitions of Care (Post Inpatient/ED visit) call.   Signature Angeline Slim, BSN, Therapist, sports

## 2022-08-23 ENCOUNTER — Encounter: Payer: Self-pay | Admitting: Internal Medicine

## 2022-08-25 ENCOUNTER — Other Ambulatory Visit: Payer: Self-pay | Admitting: Nurse Practitioner

## 2022-08-25 DIAGNOSIS — E1159 Type 2 diabetes mellitus with other circulatory complications: Secondary | ICD-10-CM

## 2022-08-25 DIAGNOSIS — I10 Essential (primary) hypertension: Secondary | ICD-10-CM

## 2022-08-26 ENCOUNTER — Telehealth: Payer: Self-pay

## 2022-08-26 ENCOUNTER — Other Ambulatory Visit (HOSPITAL_COMMUNITY): Payer: Self-pay

## 2022-08-26 NOTE — Telephone Encounter (Signed)
Pharmacy Patient Advocate Encounter   Received notification from [facility] that prior authorization for NOVOLOG is needed.    PA submitted on 08/26/22 Key [id number] Status is pending  Karie Soda, [place] Patient Advocate Specialist Direct Number: [phone] Fax: [phone]

## 2022-08-27 ENCOUNTER — Other Ambulatory Visit: Payer: Self-pay | Admitting: Nurse Practitioner

## 2022-08-27 DIAGNOSIS — I10 Essential (primary) hypertension: Secondary | ICD-10-CM

## 2022-09-04 MED ORDER — LYUMJEV KWIKPEN 100 UNIT/ML ~~LOC~~ SOPN: PEN_INJECTOR | SUBCUTANEOUS | 2 refills | Status: DC

## 2022-09-04 NOTE — Telephone Encounter (Signed)
done

## 2022-09-04 NOTE — Telephone Encounter (Addendum)
Pharmacy Patient Advocate Encounter  Received notification from [place] that the request for prior authorization for NOVOLOG  has been denied due to NOT PREFERRED, PLAN PREFERS HUMALOG, INSULIN LISPRO, [place], CPhT Pharmacy Patient Advocate Specialist Direct Number: [phone] Fax: [phone]

## 2022-09-13 ENCOUNTER — Other Ambulatory Visit: Payer: Self-pay | Admitting: Internal Medicine

## 2022-09-13 DIAGNOSIS — E1159 Type 2 diabetes mellitus with other circulatory complications: Secondary | ICD-10-CM

## 2022-09-13 DIAGNOSIS — G4733 Obstructive sleep apnea (adult) (pediatric): Secondary | ICD-10-CM | POA: Diagnosis not present

## 2022-10-12 ENCOUNTER — Other Ambulatory Visit: Payer: Self-pay | Admitting: Nurse Practitioner

## 2022-10-12 DIAGNOSIS — E1169 Type 2 diabetes mellitus with other specified complication: Secondary | ICD-10-CM

## 2022-10-13 DIAGNOSIS — G4733 Obstructive sleep apnea (adult) (pediatric): Secondary | ICD-10-CM | POA: Diagnosis not present

## 2022-11-14 DIAGNOSIS — G4733 Obstructive sleep apnea (adult) (pediatric): Secondary | ICD-10-CM | POA: Diagnosis not present

## 2022-12-12 ENCOUNTER — Other Ambulatory Visit: Payer: Self-pay | Admitting: Nurse Practitioner

## 2022-12-12 DIAGNOSIS — I10 Essential (primary) hypertension: Secondary | ICD-10-CM

## 2022-12-13 ENCOUNTER — Ambulatory Visit: Payer: Medicare Other | Admitting: Internal Medicine

## 2022-12-13 DIAGNOSIS — G4733 Obstructive sleep apnea (adult) (pediatric): Secondary | ICD-10-CM | POA: Diagnosis not present

## 2022-12-15 ENCOUNTER — Other Ambulatory Visit: Payer: Self-pay | Admitting: Nurse Practitioner

## 2022-12-15 DIAGNOSIS — I10 Essential (primary) hypertension: Secondary | ICD-10-CM

## 2022-12-16 ENCOUNTER — Encounter: Payer: Self-pay | Admitting: Internal Medicine

## 2022-12-16 ENCOUNTER — Ambulatory Visit (INDEPENDENT_AMBULATORY_CARE_PROVIDER_SITE_OTHER): Payer: BC Managed Care – PPO | Admitting: Internal Medicine

## 2022-12-16 ENCOUNTER — Encounter: Payer: Self-pay | Admitting: Gastroenterology

## 2022-12-16 VITALS — BP 136/94 | HR 82 | Ht 69.0 in | Wt 356.6 lb

## 2022-12-16 DIAGNOSIS — E785 Hyperlipidemia, unspecified: Secondary | ICD-10-CM

## 2022-12-16 DIAGNOSIS — E119 Type 2 diabetes mellitus without complications: Secondary | ICD-10-CM

## 2022-12-16 DIAGNOSIS — N521 Erectile dysfunction due to diseases classified elsewhere: Secondary | ICD-10-CM | POA: Diagnosis not present

## 2022-12-16 DIAGNOSIS — E1169 Type 2 diabetes mellitus with other specified complication: Secondary | ICD-10-CM

## 2022-12-16 DIAGNOSIS — Z6841 Body Mass Index (BMI) 40.0 and over, adult: Secondary | ICD-10-CM

## 2022-12-16 DIAGNOSIS — E1159 Type 2 diabetes mellitus with other circulatory complications: Secondary | ICD-10-CM

## 2022-12-16 DIAGNOSIS — Z794 Long term (current) use of insulin: Secondary | ICD-10-CM

## 2022-12-16 DIAGNOSIS — E66813 Obesity, class 3: Secondary | ICD-10-CM

## 2022-12-16 DIAGNOSIS — Z7984 Long term (current) use of oral hypoglycemic drugs: Secondary | ICD-10-CM

## 2022-12-16 DIAGNOSIS — Z7985 Long-term (current) use of injectable non-insulin antidiabetic drugs: Secondary | ICD-10-CM

## 2022-12-16 LAB — HEMOGLOBIN A1C: Hemoglobin A1C: 7.2
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
## 2022-12-16 NOTE — Patient Instructions (Addendum)
Please continue: - Metformin XR 500 mg 2x a day, with meals - Ozempic 2 mg weekly in a.m. (let me know if you want to try Mounjaro) - Lyumjev 10-20 units 15 min before meals  - Lantus 14(-18) units at night  Please return in 4 months.

## 2022-12-18 NOTE — Telephone Encounter (Signed)
Chart supports rx. Last OV: 07/22/2022 Next OV: 01/20/2023  

## 2023-01-05 ENCOUNTER — Other Ambulatory Visit: Payer: Self-pay | Admitting: Internal Medicine

## 2023-01-13 DIAGNOSIS — G4733 Obstructive sleep apnea (adult) (pediatric): Secondary | ICD-10-CM | POA: Diagnosis not present

## 2023-01-20 ENCOUNTER — Ambulatory Visit: Payer: BC Managed Care – PPO | Admitting: Nurse Practitioner

## 2023-01-21 ENCOUNTER — Telehealth: Payer: Self-pay | Admitting: *Deleted

## 2023-01-21 NOTE — Telephone Encounter (Signed)
Yesi,  This pt's BMI is greater than 50; their procedure will need to be performed at the hospital.  Thanks,  John Nulty 

## 2023-01-23 ENCOUNTER — Encounter (INDEPENDENT_AMBULATORY_CARE_PROVIDER_SITE_OTHER): Payer: Self-pay

## 2023-01-27 ENCOUNTER — Ambulatory Visit (AMBULATORY_SURGERY_CENTER): Payer: Medicare Other | Admitting: *Deleted

## 2023-01-27 VITALS — Ht 69.0 in | Wt 349.0 lb

## 2023-01-27 DIAGNOSIS — Z1211 Encounter for screening for malignant neoplasm of colon: Secondary | ICD-10-CM

## 2023-01-27 MED ORDER — NA SULFATE-K SULFATE-MG SULF 17.5-3.13-1.6 GM/177ML PO SOLN
1.0000 | Freq: Once | ORAL | 0 refills | Status: AC
Start: 2023-01-27 — End: 2023-01-27

## 2023-01-27 NOTE — Progress Notes (Signed)
Pt's name and DOB verified at the beginning of the pre-visit.  Pt denies any difficulty with ambulating,sitting, laying down or rolling side to side Gave both [facility] main # and MD on call # prior to instructions.  No egg or soy allergy known to patient  No issues known to pt with past sedation with any surgeries or procedures Pt denies having issues being intubated Pt has no issues moving head neck or swallowing No FH of Malignant Hyperthermia Pt is not on diet pills Pt is not on home 02  Pt is not on blood thinners  Pt denies issues with constipation  Pt is not on dialysis Pt denise any abnormal heart rhythms  Pt denies any upcoming cardiac testing Pt encouraged to use to use Singlecare or Goodrx to reduce cost  Patient's chart reviewed by Cathlyn Parsons CNRA prior to pre-visit and patient appropriate for the [facility].  Pre-visit completed and red dot placed by patient's name on their procedure day (on provider's schedule).  . Visit by phone Pt states weight is 349 lb pt's BMI above limit  Instructed pt why it is important to and  to call if they have any changes in health or new medications. Directed them to the # given and on instructions.   Pt states they will.  Instructions reviewed with pt and pt states understanding. Instructed to review again prior to procedure. Pt states they will.  Instructions sent by mail with understanding rom pt that no dates will be filled out. Pt states he understood.  Pt informed of concerns with BMI and need to make OV so hospital procedure can be scheduled. Pt stated he understood and OV with Amy Esterwood for 04/13/23 @1 :30 pm instructed him to come 15 min early made.

## 2023-02-04 ENCOUNTER — Other Ambulatory Visit: Payer: Self-pay | Admitting: Nurse Practitioner

## 2023-02-04 DIAGNOSIS — E1169 Type 2 diabetes mellitus with other specified complication: Secondary | ICD-10-CM

## 2023-02-14 ENCOUNTER — Encounter: Payer: Self-pay | Admitting: Pharmacist

## 2023-02-17 ENCOUNTER — Encounter: Payer: BC Managed Care – PPO | Admitting: Gastroenterology

## 2023-02-24 ENCOUNTER — Encounter: Payer: Self-pay | Admitting: Nurse Practitioner

## 2023-02-24 ENCOUNTER — Ambulatory Visit (INDEPENDENT_AMBULATORY_CARE_PROVIDER_SITE_OTHER): Payer: BC Managed Care – PPO | Admitting: Nurse Practitioner

## 2023-02-24 VITALS — BP 120/80 | HR 87 | Temp 98.0°F | Ht 69.0 in | Wt 355.2 lb

## 2023-02-24 DIAGNOSIS — I10 Essential (primary) hypertension: Secondary | ICD-10-CM

## 2023-02-24 DIAGNOSIS — F3342 Major depressive disorder, recurrent, in full remission: Secondary | ICD-10-CM

## 2023-02-24 DIAGNOSIS — Z794 Long term (current) use of insulin: Secondary | ICD-10-CM

## 2023-02-24 DIAGNOSIS — Z7985 Long-term (current) use of injectable non-insulin antidiabetic drugs: Secondary | ICD-10-CM

## 2023-02-24 DIAGNOSIS — Z7984 Long term (current) use of oral hypoglycemic drugs: Secondary | ICD-10-CM

## 2023-02-24 DIAGNOSIS — Z23 Encounter for immunization: Secondary | ICD-10-CM

## 2023-02-24 DIAGNOSIS — M25551 Pain in right hip: Secondary | ICD-10-CM

## 2023-02-24 DIAGNOSIS — M48061 Spinal stenosis, lumbar region without neurogenic claudication: Secondary | ICD-10-CM

## 2023-02-24 DIAGNOSIS — E1159 Type 2 diabetes mellitus with other circulatory complications: Secondary | ICD-10-CM | POA: Diagnosis not present

## 2023-02-24 LAB — RENAL FUNCTION PANEL
Albumin: 4.1 g/dL (ref 3.5–5.2)
BUN: 12 mg/dL (ref 6–23)
CO2: 27 meq/L (ref 19–32)
Calcium: 9.5 mg/dL (ref 8.4–10.5)
Chloride: 96 meq/L (ref 96–112)
Creatinine, Ser: 1.01 mg/dL (ref 0.40–1.50)
GFR: 79.08 mL/min (ref >60.00)
Glucose, Bld: 140 mg/dL — ABNORMAL HIGH (ref 70–99)
Phosphorus: 3.5 mg/dL (ref 2.3–4.6)
Potassium: 4 meq/L (ref 3.5–5.1)
Sodium: 136 meq/L (ref 135–145)

## 2023-02-24 LAB — HEMOGLOBIN A1C: Hgb A1c MFr Bld: 7.8 % — ABNORMAL HIGH (ref 4.6–6.5)

## 2023-02-24 MED ORDER — METFORMIN HCL ER 500 MG PO TB24
500.0000 mg | ORAL_TABLET | Freq: Two times a day (BID) | ORAL | 3 refills | Status: DC
Start: 2023-02-24 — End: 2024-03-02

## 2023-02-24 MED ORDER — SPIRONOLACTONE 25 MG PO TABS
25.0000 mg | ORAL_TABLET | Freq: Every day | ORAL | 1 refills | Status: DC
Start: 2023-02-24 — End: 2023-11-28

## 2023-02-24 MED ORDER — DULOXETINE HCL 60 MG PO CPEP
60.0000 mg | ORAL_CAPSULE | Freq: Every day | ORAL | 3 refills | Status: DC
Start: 2023-02-24 — End: 2023-09-03

## 2023-02-24 NOTE — Assessment & Plan Note (Signed)
Home BP: 140/86, 123/73, 140/71. Prior to AM dose of BP meds. No adverse effects with coreg, benicar/hydrochlorothiazide, and spironolactone. BP Readings from Last 3 Encounters:  02/24/23 120/80  12/16/22 (!) 136/94  08/18/22 129/79    Maintain med doses Repeat BMP

## 2023-02-24 NOTE — Progress Notes (Signed)
Established Patient Visit  Patient: Brent Wallace   DOB: 09/01/1960   62 y.o. Male  MRN: [id number] Visit Date: 02/24/2023  Subjective:    Chief Complaint  Patient presents with   Follow-up    6 month follow up. Pt is fasting. Pt does want a flu shot today.   HPI Accompanied by Husband Hypertension Home BP: 140/86, 123/73, 140/71. Prior to AM dose of BP meds. No adverse effects with coreg, benicar/hydrochlorothiazide, and spironolactone. BP Readings from Last 3 Encounters:  02/24/23 120/80  12/16/22 (!) 136/94  08/18/22 129/79    Maintain med doses Repeat BMP  Type 2 diabetes mellitus with circulatory disorder causing erectile dysfunction (HCC) Controlled DIABETES with lantus, ozempic, insulin lispro, and metformin Followed by endocrinology next appointment 04/2023 Reports intermittent hypoglycemia per CGM-60s, but asymptomatic. Finger sticks indicates glucose in 10-20points higher. Negative nephropathy, neuropathy and retinopathy. Administered high dose flu vaccine today Advised to get updated COVID and shingrix vaccine  Plans to schedule appointment for inpatient colonoscopy.   Wt Readings from Last 3 Encounters:  02/24/23 (!) 355 lb 3.2 oz (161.1 kg)  01/27/23 (!) 349 lb (158.3 kg)  12/16/22 (!) 356 lb 9.6 oz (161.8 kg)    Reviewed medical, surgical, and social history today  Medications: Outpatient Medications Prior to Visit  Medication Sig   atorvastatin (LIPITOR) 20 MG tablet TAKE 1 TABLET BY MOUTH THREE TIMES A WEEK.   carvedilol (COREG) 3.125 MG tablet TAKE 1 TABLET BY MOUTH TWICE A DAY WITH MEAL   cholecalciferol (VITAMIN D) 1000 units tablet Take 2,000 Units by mouth daily.   Continuous Blood Gluc Sensor (FREESTYLE LIBRE 3 SENSOR) MISC 1 EACH BY DOES NOT APPLY ROUTE EVERY 14 (FOURTEEN) DAYS.   DULoxetine (CYMBALTA) 60 MG capsule Take 1 capsule (60 mg total) by mouth daily.   insulin glargine (LANTUS SOLOSTAR) 100 UNIT/ML Solostar Pen Inject  14-18 Units into the skin at bedtime.   Insulin Lispro-aabc (LYUMJEV KWIKPEN) 100 UNIT/ML KwikPen INJECT 20-30 UNITS INTO SKIN 3 TIMES A DAY BEFORE MEALS   Insulin Pen Needle (BD PEN NEEDLE NANO 2ND GEN) 32G X 4 MM MISC USE AS DIRECTED EVERY DAY   metFORMIN (GLUCOPHAGE-XR) 500 MG 24 hr tablet TAKE 1 TABLET BY MOUTH TWICE A DAY   olmesartan-hydrochlorothiazide (BENICAR HCT) 40-12.5 MG tablet TAKE 1 TABLET BY MOUTH EVERY DAY   ONETOUCH DELICA LANCETS 33G MISC Use 2x a day   ONETOUCH ULTRA test strip 1 EACH BY OTHER ROUTE IN THE MORNING, AT NOON, AND AT BEDTIME. ONE TOUCH ULTRA   OVER THE COUNTER MEDICATION Delta * gummy at bedtime with a small amount of THC   Semaglutide, 2 MG/DOSE, (OZEMPIC, 2 MG/DOSE,) 8 MG/3ML SOPN INJECT 2 MG INTO THE SKIN ONCE A WEEK   sildenafil (VIAGRA) 100 MG tablet Take 0.5-1 tab 30 minutes prior to sexual activity as needed.   spironolactone (ALDACTONE) 25 MG tablet TAKE 1 TABLET (25 MG TOTAL) BY MOUTH DAILY.   No facility-administered medications prior to visit.   Reviewed past medical and social history.   ROS per HPI above      Objective:  BP 120/80   Pulse 87   Temp 98 F (36.7 C) (Temporal)   Ht 5\' 9"  (1.753 m)   Wt (!) 355 lb 3.2 oz (161.1 kg)   SpO2 97%   BMI 52.45 kg/m      Physical Exam Cardiovascular:  Rate and Rhythm: Normal rate and regular rhythm.     Pulses: Normal pulses.     Heart sounds: Normal heart sounds.  Pulmonary:     Effort: Pulmonary effort is normal.     Breath sounds: Normal breath sounds.  Musculoskeletal:     Right lower leg: No edema.     Left lower leg: No edema.  Neurological:     Mental Status: He is alert and oriented to person, place, and time.  Psychiatric:        Mood and Affect: Mood normal.        Behavior: Behavior normal.        Thought Content: Thought content normal.     No results found for any visits on 02/24/23.    Assessment & Plan:    Problem List Items Addressed This Visit      Hypertension - Primary    Home BP: 140/86, 123/73, 140/71. Prior to AM dose of BP meds. No adverse effects with coreg, benicar/hydrochlorothiazide, and spironolactone. BP Readings from Last 3 Encounters:  02/24/23 120/80  12/16/22 (!) 136/94  08/18/22 129/79    Maintain med doses Repeat BMP      Relevant Orders   Renal Function Panel   Type 2 diabetes mellitus with circulatory disorder causing erectile dysfunction (HCC)    Controlled DIABETES with lantus, ozempic, insulin lispro, and metformin Followed by endocrinology next appointment 04/2023 Reports intermittent hypoglycemia per CGM-60s, but asymptomatic. Finger sticks indicates glucose in 10-20points higher. Negative nephropathy, neuropathy and retinopathy. Administered high dose flu vaccine today Advised to get updated COVID and shingrix vaccine      Relevant Orders   Hemoglobin A1c   Other Visit Diagnoses     Immunization due       Relevant Orders   Flu Vaccine Trivalent High Dose (Fluad) (Completed)      Return in about 6 months (around 08/24/2023) for HTN, DM, hyperlipidemia (fasting).     Alysia Penna, NP

## 2023-02-24 NOTE — Assessment & Plan Note (Signed)
Controlled DIABETES with lantus, ozempic, insulin lispro, and metformin Followed by endocrinology next appointment 04/2023 Reports intermittent hypoglycemia per CGM-60s, but asymptomatic. Finger sticks indicates glucose in 10-20points higher. Negative nephropathy, neuropathy and retinopathy. Administered high dose flu vaccine today Advised to get updated COVID and shingrix vaccine

## 2023-02-24 NOTE — Patient Instructions (Addendum)
Obtain shingrix vaccine and updated COVID vaccine from retail pharmacy. Go to lab Maintain current meds

## 2023-03-03 DIAGNOSIS — Z6841 Body Mass Index (BMI) 40.0 and over, adult: Secondary | ICD-10-CM | POA: Diagnosis not present

## 2023-03-03 DIAGNOSIS — M160 Bilateral primary osteoarthritis of hip: Secondary | ICD-10-CM | POA: Diagnosis not present

## 2023-03-29 ENCOUNTER — Other Ambulatory Visit: Payer: Self-pay | Admitting: Internal Medicine

## 2023-03-29 DIAGNOSIS — E1159 Type 2 diabetes mellitus with other circulatory complications: Secondary | ICD-10-CM

## 2023-03-31 DIAGNOSIS — M25551 Pain in right hip: Secondary | ICD-10-CM | POA: Diagnosis not present

## 2023-03-31 DIAGNOSIS — R29898 Other symptoms and signs involving the musculoskeletal system: Secondary | ICD-10-CM | POA: Diagnosis not present

## 2023-03-31 DIAGNOSIS — Z7409 Other reduced mobility: Secondary | ICD-10-CM | POA: Diagnosis not present

## 2023-03-31 DIAGNOSIS — M25651 Stiffness of right hip, not elsewhere classified: Secondary | ICD-10-CM | POA: Diagnosis not present

## 2023-04-07 ENCOUNTER — Encounter: Payer: Self-pay | Admitting: Orthopaedic Surgery

## 2023-04-07 ENCOUNTER — Other Ambulatory Visit: Payer: Self-pay

## 2023-04-07 ENCOUNTER — Ambulatory Visit (INDEPENDENT_AMBULATORY_CARE_PROVIDER_SITE_OTHER): Payer: BC Managed Care – PPO | Admitting: Orthopaedic Surgery

## 2023-04-07 ENCOUNTER — Encounter: Payer: Self-pay | Admitting: Nurse Practitioner

## 2023-04-07 VITALS — Ht 69.0 in | Wt 365.0 lb

## 2023-04-07 DIAGNOSIS — M160 Bilateral primary osteoarthritis of hip: Secondary | ICD-10-CM

## 2023-04-07 DIAGNOSIS — M1612 Unilateral primary osteoarthritis, left hip: Secondary | ICD-10-CM | POA: Diagnosis not present

## 2023-04-07 DIAGNOSIS — E66813 Obesity, class 3: Secondary | ICD-10-CM | POA: Diagnosis not present

## 2023-04-07 DIAGNOSIS — M1611 Unilateral primary osteoarthritis, right hip: Secondary | ICD-10-CM

## 2023-04-07 DIAGNOSIS — M25552 Pain in left hip: Secondary | ICD-10-CM

## 2023-04-07 DIAGNOSIS — M25551 Pain in right hip: Secondary | ICD-10-CM

## 2023-04-07 DIAGNOSIS — Z6841 Body Mass Index (BMI) 40.0 and over, adult: Secondary | ICD-10-CM
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
## 2023-04-14 ENCOUNTER — Encounter: Payer: Self-pay | Admitting: Physician Assistant

## 2023-04-14 ENCOUNTER — Ambulatory Visit (INDEPENDENT_AMBULATORY_CARE_PROVIDER_SITE_OTHER): Payer: BC Managed Care – PPO | Admitting: Physician Assistant

## 2023-04-14 VITALS — BP 138/74 | HR 81 | Ht 70.0 in | Wt 358.0 lb

## 2023-04-14 DIAGNOSIS — Z1211 Encounter for screening for malignant neoplasm of colon: Secondary | ICD-10-CM | POA: Diagnosis not present

## 2023-04-14 MED ORDER — CLENPIQ 10-3.5-12 MG-GM -GM/160ML PO SOLN: 1.0000 | ORAL | Status: DC

## 2023-04-14 NOTE — Patient Instructions (Signed)
You have been scheduled for a colonoscopy. Please follow written instructions given to you at your visit today.   Please pick up your prep supplies at the pharmacy within the next 1-3 days.  If you use inhalers (even only as needed), please bring them with you on the day of your procedure.  DO NOT TAKE 7 DAYS PRIOR TO TEST- Trulicity (dulaglutide) Ozempic, Wegovy (semaglutide) Mounjaro (tirzepatide) Bydureon Bcise (exanatide extended release)  DO NOT TAKE 1 DAY PRIOR TO YOUR TEST Rybelsus (semaglutide) Adlyxin (lixisenatide) Victoza (liraglutide) Byetta (exanatide) ___________________________________________________________________________  I appreciate the opportunity to care for you. Amy Esterwood, PA-C
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
## 2023-04-15 NOTE — Progress Notes (Signed)
Agree with assessment/plan.  Raj Florestine Carmical, MD [facility] [phone]  

## 2023-04-21 ENCOUNTER — Ambulatory Visit (INDEPENDENT_AMBULATORY_CARE_PROVIDER_SITE_OTHER): Payer: BC Managed Care – PPO | Admitting: Internal Medicine

## 2023-04-21 ENCOUNTER — Encounter: Payer: Self-pay | Admitting: Internal Medicine

## 2023-04-21 VITALS — BP 138/80 | HR 92 | Ht 70.0 in | Wt 364.0 lb

## 2023-04-21 DIAGNOSIS — E785 Hyperlipidemia, unspecified: Secondary | ICD-10-CM

## 2023-04-21 DIAGNOSIS — E1169 Type 2 diabetes mellitus with other specified complication: Secondary | ICD-10-CM

## 2023-04-21 DIAGNOSIS — N521 Erectile dysfunction due to diseases classified elsewhere: Secondary | ICD-10-CM | POA: Diagnosis not present

## 2023-04-21 DIAGNOSIS — Z7984 Long term (current) use of oral hypoglycemic drugs: Secondary | ICD-10-CM

## 2023-04-21 DIAGNOSIS — Z7985 Long-term (current) use of injectable non-insulin antidiabetic drugs: Secondary | ICD-10-CM

## 2023-04-21 DIAGNOSIS — Z794 Long term (current) use of insulin: Secondary | ICD-10-CM | POA: Diagnosis not present

## 2023-04-21 DIAGNOSIS — E1159 Type 2 diabetes mellitus with other circulatory complications: Secondary | ICD-10-CM

## 2023-04-21 LAB — POCT GLYCOSYLATED HEMOGLOBIN (HGB A1C): Hemoglobin A1C: 7.8 % — AB (ref 4.0–5.6)

## 2023-04-21 MED ORDER — LYUMJEV KWIKPEN 100 UNIT/ML ~~LOC~~ SOPN: PEN_INJECTOR | SUBCUTANEOUS | Status: DC

## 2023-04-21 MED ORDER — LANTUS SOLOSTAR 100 UNIT/ML ~~LOC~~ SOPN: 14.0000 [IU] | PEN_INJECTOR | Freq: Every day | SUBCUTANEOUS | Status: DC

## 2023-04-21 NOTE — Patient Instructions (Addendum)
Please continue: - Metformin XR 500 mg 2x a day, with meals - Ozempic 2 mg weekly in a.m.   Please reduce: - Lyumjev 10-20 units before meals   Take consistently: - Lantus 15 units at night   Please return in 4 months.
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
## 2023-04-21 NOTE — Addendum Note (Signed)
Addended by: Pollie Meyer on: 04/21/2023 03:44 PM   Modules accepted: Orders

## 2023-05-01 DIAGNOSIS — M25651 Stiffness of right hip, not elsewhere classified: Secondary | ICD-10-CM | POA: Diagnosis not present

## 2023-05-01 DIAGNOSIS — R29898 Other symptoms and signs involving the musculoskeletal system: Secondary | ICD-10-CM | POA: Diagnosis not present

## 2023-05-01 DIAGNOSIS — Z7409 Other reduced mobility: Secondary | ICD-10-CM | POA: Diagnosis not present

## 2023-05-01 DIAGNOSIS — M25551 Pain in right hip: Secondary | ICD-10-CM | POA: Diagnosis not present

## 2023-05-12 DIAGNOSIS — R29898 Other symptoms and signs involving the musculoskeletal system: Secondary | ICD-10-CM | POA: Diagnosis not present

## 2023-05-12 DIAGNOSIS — M25551 Pain in right hip: Secondary | ICD-10-CM | POA: Diagnosis not present

## 2023-05-12 DIAGNOSIS — Z7409 Other reduced mobility: Secondary | ICD-10-CM | POA: Diagnosis not present

## 2023-05-12 DIAGNOSIS — M25651 Stiffness of right hip, not elsewhere classified: Secondary | ICD-10-CM | POA: Diagnosis not present

## 2023-05-16 ENCOUNTER — Encounter: Payer: Self-pay | Admitting: Internal Medicine

## 2023-05-16 MED ORDER — ONETOUCH DELICA LANCETS 33G MISC: 11 refills | Status: AC

## 2023-05-16 MED ORDER — BD PEN NEEDLE NANO 2ND GEN 32G X 4 MM MISC: 5 refills | Status: DC

## 2023-05-19 DIAGNOSIS — Z7409 Other reduced mobility: Secondary | ICD-10-CM | POA: Diagnosis not present

## 2023-05-19 DIAGNOSIS — R29898 Other symptoms and signs involving the musculoskeletal system: Secondary | ICD-10-CM | POA: Diagnosis not present

## 2023-05-19 DIAGNOSIS — M25551 Pain in right hip: Secondary | ICD-10-CM | POA: Diagnosis not present

## 2023-05-19 DIAGNOSIS — M25651 Stiffness of right hip, not elsewhere classified: Secondary | ICD-10-CM | POA: Diagnosis not present

## 2023-05-26 DIAGNOSIS — M25551 Pain in right hip: Secondary | ICD-10-CM | POA: Diagnosis not present

## 2023-05-26 DIAGNOSIS — M25651 Stiffness of right hip, not elsewhere classified: Secondary | ICD-10-CM | POA: Diagnosis not present

## 2023-05-26 DIAGNOSIS — R29898 Other symptoms and signs involving the musculoskeletal system: Secondary | ICD-10-CM | POA: Diagnosis not present

## 2023-05-26 DIAGNOSIS — Z7409 Other reduced mobility: Secondary | ICD-10-CM | POA: Diagnosis not present

## 2023-05-30 ENCOUNTER — Telehealth: Payer: Self-pay

## 2023-05-30 MED ORDER — LANTUS SOLOSTAR 100 UNIT/ML ~~LOC~~ SOPN: 14.0000 [IU] | PEN_INJECTOR | Freq: Every day | SUBCUTANEOUS | 3 refills | Status: DC

## 2023-05-30 NOTE — Telephone Encounter (Signed)
Requested Prescriptions   Signed Prescriptions Disp Refills   insulin glargine (LANTUS SOLOSTAR) 100 UNIT/ML Solostar Pen 15 mL 3    Sig: Inject 14-18 Units into the skin at bedtime.    Authorizing Provider: Carlus Pavlov    Ordering User: Pollie Meyer

## 2023-06-16 DIAGNOSIS — M25651 Stiffness of right hip, not elsewhere classified: Secondary | ICD-10-CM | POA: Diagnosis not present

## 2023-06-16 DIAGNOSIS — R29898 Other symptoms and signs involving the musculoskeletal system: Secondary | ICD-10-CM | POA: Diagnosis not present

## 2023-06-16 DIAGNOSIS — Z7409 Other reduced mobility: Secondary | ICD-10-CM | POA: Diagnosis not present

## 2023-06-16 DIAGNOSIS — M25551 Pain in right hip: Secondary | ICD-10-CM | POA: Diagnosis not present

## 2023-06-27 ENCOUNTER — Encounter (HOSPITAL_COMMUNITY): Payer: Self-pay | Admitting: Gastroenterology

## 2023-06-27 NOTE — Progress Notes (Signed)
Attempted to obtain medical history for pre op call via telephone, unable to reach at this time. HIPAA compliant voicemail message left requesting return call to pre surgical testing department.

## 2023-07-07 ENCOUNTER — Ambulatory Visit (HOSPITAL_COMMUNITY): Payer: BC Managed Care – PPO | Admitting: Anesthesiology

## 2023-07-07 ENCOUNTER — Ambulatory Visit: Payer: Medicare Other | Admitting: Orthopaedic Surgery

## 2023-07-07 ENCOUNTER — Other Ambulatory Visit: Payer: Self-pay

## 2023-07-07 ENCOUNTER — Encounter (HOSPITAL_COMMUNITY)
Admission: RE | Disposition: A | Discharge status: Home / Self Care | Payer: Self-pay | Source: Home / Self Care | Attending: Gastroenterology

## 2023-07-07 ENCOUNTER — Ambulatory Visit (HOSPITAL_COMMUNITY)
Admission: RE | Admit: 2023-07-07 | Discharge: 2023-07-07 | Disposition: A | Discharge status: Home / Self Care | Payer: BC Managed Care – PPO | Attending: Gastroenterology | Admitting: Gastroenterology

## 2023-07-07 ENCOUNTER — Encounter (HOSPITAL_COMMUNITY): Payer: Self-pay | Admitting: Gastroenterology

## 2023-07-07 DIAGNOSIS — Z79899 Other long term (current) drug therapy: Secondary | ICD-10-CM | POA: Insufficient documentation

## 2023-07-07 DIAGNOSIS — Z7985 Long-term (current) use of injectable non-insulin antidiabetic drugs: Secondary | ICD-10-CM | POA: Diagnosis not present

## 2023-07-07 DIAGNOSIS — Z8719 Personal history of other diseases of the digestive system: Secondary | ICD-10-CM | POA: Insufficient documentation

## 2023-07-07 DIAGNOSIS — E6689 Other obesity not elsewhere classified: Secondary | ICD-10-CM | POA: Insufficient documentation

## 2023-07-07 DIAGNOSIS — D122 Benign neoplasm of ascending colon: Secondary | ICD-10-CM

## 2023-07-07 DIAGNOSIS — K5730 Diverticulosis of large intestine without perforation or abscess without bleeding: Secondary | ICD-10-CM | POA: Diagnosis not present

## 2023-07-07 DIAGNOSIS — Z8711 Personal history of peptic ulcer disease: Secondary | ICD-10-CM | POA: Diagnosis not present

## 2023-07-07 DIAGNOSIS — D125 Benign neoplasm of sigmoid colon: Secondary | ICD-10-CM | POA: Insufficient documentation

## 2023-07-07 DIAGNOSIS — E1159 Type 2 diabetes mellitus with other circulatory complications: Secondary | ICD-10-CM | POA: Diagnosis not present

## 2023-07-07 DIAGNOSIS — I110 Hypertensive heart disease with heart failure: Secondary | ICD-10-CM | POA: Diagnosis not present

## 2023-07-07 DIAGNOSIS — E785 Hyperlipidemia, unspecified: Secondary | ICD-10-CM | POA: Diagnosis not present

## 2023-07-07 DIAGNOSIS — G4733 Obstructive sleep apnea (adult) (pediatric): Secondary | ICD-10-CM | POA: Insufficient documentation

## 2023-07-07 DIAGNOSIS — Z7984 Long term (current) use of oral hypoglycemic drugs: Secondary | ICD-10-CM | POA: Insufficient documentation

## 2023-07-07 DIAGNOSIS — Z7409 Other reduced mobility: Secondary | ICD-10-CM | POA: Insufficient documentation

## 2023-07-07 DIAGNOSIS — Z1211 Encounter for screening for malignant neoplasm of colon: Secondary | ICD-10-CM | POA: Insufficient documentation

## 2023-07-07 DIAGNOSIS — M1990 Unspecified osteoarthritis, unspecified site: Secondary | ICD-10-CM | POA: Insufficient documentation

## 2023-07-07 DIAGNOSIS — I509 Heart failure, unspecified: Secondary | ICD-10-CM | POA: Diagnosis not present

## 2023-07-07 DIAGNOSIS — Z794 Long term (current) use of insulin: Secondary | ICD-10-CM | POA: Diagnosis not present

## 2023-07-07 DIAGNOSIS — Z87891 Personal history of nicotine dependence: Secondary | ICD-10-CM | POA: Insufficient documentation

## 2023-07-07 DIAGNOSIS — Z860102 Personal history of hyperplastic colon polyps: Secondary | ICD-10-CM

## 2023-07-07 DIAGNOSIS — D123 Benign neoplasm of transverse colon: Secondary | ICD-10-CM | POA: Insufficient documentation

## 2023-07-07 DIAGNOSIS — Z6841 Body Mass Index (BMI) 40.0 and over, adult: Secondary | ICD-10-CM | POA: Diagnosis not present

## 2023-07-07 DIAGNOSIS — K640 First degree hemorrhoids: Secondary | ICD-10-CM | POA: Diagnosis not present

## 2023-07-07 DIAGNOSIS — E1169 Type 2 diabetes mellitus with other specified complication: Secondary | ICD-10-CM | POA: Diagnosis not present

## 2023-07-07 HISTORY — PX: COLONOSCOPY WITH PROPOFOL: SHX5780

## 2023-07-07 HISTORY — PX: POLYPECTOMY: SHX5525

## 2023-07-07 LAB — GLUCOSE, CAPILLARY: Glucose-Capillary: 144 mg/dL — ABNORMAL HIGH (ref 70–99)

## 2023-07-07 SURGERY — COLONOSCOPY WITH PROPOFOL
Anesthesia: Monitor Anesthesia Care

## 2023-07-07 MED ORDER — SODIUM CHLORIDE 0.9 % IV SOLN: INTRAVENOUS | Status: DC

## 2023-07-07 MED ORDER — SODIUM CHLORIDE 0.9 % IV SOLN: INTRAVENOUS | Status: DC | PRN

## 2023-07-07 MED ORDER — PROPOFOL 10 MG/ML IV BOLUS
INTRAVENOUS | Status: DC | PRN
  Administered 2023-07-07: 90 mg via INTRAVENOUS

## 2023-07-07 MED ORDER — PROPOFOL 500 MG/50ML IV EMUL
INTRAVENOUS | Status: DC | PRN
  Administered 2023-07-07: 100 ug/kg/min via INTRAVENOUS

## 2023-07-07 MED ORDER — LIDOCAINE HCL (CARDIAC) PF 100 MG/5ML IV SOSY
PREFILLED_SYRINGE | INTRAVENOUS | Status: DC | PRN
  Administered 2023-07-07: 80 mg via INTRAVENOUS

## 2023-07-07 MED ORDER — PROPOFOL 1000 MG/100ML IV EMUL
INTRAVENOUS | Status: AC
  Filled 2023-07-07: qty 100

## 2023-07-07 SURGICAL SUPPLY — 21 items

## 2023-07-07 NOTE — Anesthesia Preprocedure Evaluation (Signed)
Anesthesia Evaluation  Patient identified by MRN, date of birth, ID band Patient awake    Reviewed: Allergy & Precautions, NPO status , Patient's Chart, lab work & pertinent test results  Airway Mallampati: III  TM Distance: >3 FB Neck ROM: Full    Dental  (+) Dental Advisory Given   Pulmonary sleep apnea , former smoker   breath sounds clear to auscultation       Cardiovascular hypertension, Pt. on medications  Rhythm:Regular Rate:Normal     Neuro/Psych  Neuromuscular disease    GI/Hepatic Neg liver ROS, PUD,,,  Endo/Other  diabetes, Type 2  Class 4 obesity  Renal/GU negative Renal ROS     Musculoskeletal  (+) Arthritis ,    Abdominal   Peds  Hematology negative hematology ROS (+)   Anesthesia Other Findings   Reproductive/Obstetrics                             Anesthesia Physical Anesthesia Plan  ASA: 3  Anesthesia Plan: MAC   Post-op Pain Management:    Induction:   PONV Risk Score and Plan: 1 and Propofol infusion  Airway Management Planned: Natural Airway and Simple Face Mask  Additional Equipment:   Intra-op Plan:   Post-operative Plan:   Informed Consent: I have reviewed the patients History and Physical, chart, labs and discussed the procedure including the risks, benefits and alternatives for the proposed anesthesia with the patient or authorized representative who has indicated his/her understanding and acceptance.       Plan Discussed with:   Anesthesia Plan Comments:        Anesthesia Quick Evaluation
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
## 2023-07-07 NOTE — Op Note (Signed)
[facility] Patient Name: Brent Wallace Procedure Date: 07/07/2023 MRN: [id number] Attending MD: Lynann Bologna , MD, [id number] Date of Birth: [birth year] CSN: [id number] Age: 63 Admit Type: Outpatient Procedure:                Colonoscopy Indications:              Screening for colorectal malignant neoplasm Providers:                Lynann Bologna, MD, Doristine Mango, RN, Alan Ripper,                            Technician Referring MD:              Medicines:                Monitored Anesthesia Care Complications:            No immediate complications. Estimated Blood Loss:     Estimated blood loss: none. Procedure:                Pre-Anesthesia Assessment:                           - Prior to the procedure, a History and Physical                            was performed, and patient medications and                            allergies were reviewed. The patient's tolerance of                            previous anesthesia was also reviewed. The risks                            and benefits of the procedure and the sedation                            options and risks were discussed with the patient.                            All questions were answered, and informed consent                            was obtained. Prior Anticoagulants: The patient has                            taken no anticoagulant or antiplatelet agents. ASA                            Grade Assessment: III - A patient with severe                            systemic disease. After reviewing the risks and  benefits, the patient was deemed in satisfactory                            condition to undergo the procedure.                           After obtaining informed consent, the colonoscope                            was passed under direct vision. Throughout the                            procedure, the patient's blood pressure, pulse, and                            oxygen  saturations were monitored continuously. The                            CF-HQ190L ([id number]) Olympus colonoscope was                            introduced through the anus and advanced to the the                            cecum, identified by appendiceal orifice and                            ileocecal valve. The colonoscopy was performed                            without difficulty. The patient tolerated the                            procedure well. The quality of the bowel                            preparation was adequate to identify polyps. There                            was thick mucus and some retained stool which could                            not be fully washed despite aggressive suctioning                            and aspiration. The ileocecal valve, appendiceal                            orifice, and rectum were photographed. Scope In: 8:15:34 AM Scope Out: 8:45:14 AM Scope Withdrawal Time: 0 hours 26 minutes 6 seconds  Total Procedure Duration: 0 hours 29 minutes 40 seconds  Findings:      Three semi-pedunculated polyps were found in the proximal transverse       colon, mid transverse colon and mid ascending colon.  The polyps were 8       to 10 mm in size. These polyps were removed with a hot snare. Resection       and retrieval were complete.      Two semi-pedunculated polyps were found in the proximal sigmoid colon.       The polyps were 6 to 10 mm in size. These polyps were removed with a hot       snare. Resection and retrieval were complete.      A few medium-mouthed diverticula were found in the sigmoid colon.      Non-bleeding internal hemorrhoids were found during retroflexion and       during perianal exam. The hemorrhoids were small and Grade I (internal       hemorrhoids that do not prolapse).      The exam was otherwise without abnormality on direct and retroflexion       views. Impression:               - Three 8 to 10 mm polyps in the proximal                             transverse colon, in the mid transverse colon and                            in the mid ascending colon, removed with a hot                            snare. Resected and retrieved.                           - Two 6 to 10 mm polyps in the proximal sigmoid                            colon, removed with a hot snare. Resected and                            retrieved.                           - Mild sigmoid diverticulosis.                           - Non-bleeding internal hemorrhoids.                           - The examination was otherwise normal on direct                            and retroflexion views. Moderate Sedation:      Not Applicable - Patient had care per Anesthesia. Recommendation:           - Patient has a contact number available for                            emergencies. The signs and symptoms of potential  delayed complications were discussed with the                            patient. Return to normal activities tomorrow.                            Written discharge instructions were provided to the                            patient.                           - Resume previous diet.                           - No aspirin, ibuprofen, naproxen, or other                            non-steroidal anti-inflammatory drugs for 5 days                            after polyp removal.                           - Await pathology results.                           - Repeat colonoscopy for surveillance based on                            pathology results. For next colonoscopy he would                            need a 2-day prep.                           - The findings and recommendations were discussed                            with the patient's family. Procedure Code(s):        --- Professional ---                           [phone], Colonoscopy, flexible; with removal of                            tumor(s), polyp(s), or other  lesion(s) by snare                            technique Diagnosis Code(s):        --- Professional ---                           Z12.11, Encounter for screening for malignant                            neoplasm of colon  D12.3, Benign neoplasm of transverse colon (hepatic                            flexure or splenic flexure)                           D12.2, Benign neoplasm of ascending colon                           D12.5, Benign neoplasm of sigmoid colon                           K64.0, First degree hemorrhoids                           K57.30, Diverticulosis of large intestine without                            perforation or abscess without bleeding CPT copyright 2022 [employer]. All rights reserved. The codes documented in this report are preliminary and upon coder review may  be revised to meet current compliance requirements. Lynann Bologna, MD 07/07/2023 8:59:10 AM This report has been signed electronically. Number of Addenda: 0

## 2023-07-07 NOTE — Transfer of Care (Signed)
Immediate Anesthesia Transfer of Care Note  Patient: Brent Wallace  Procedure(s) Performed: COLONOSCOPY WITH PROPOFOL POLYPECTOMY  Patient Location: PACU  Anesthesia Type:MAC  Level of Consciousness: awake  Airway & Oxygen Therapy: Patient Spontanous Breathing and Patient connected to face mask oxygen  Post-op Assessment: Report given to RN and Post -op Vital signs reviewed and stable  Post vital signs: Reviewed and stable  Last Vitals:  Vitals Value Taken Time  BP 108/60 07/07/23 0850  Temp    Pulse 86 07/07/23 0851  Resp 15 07/07/23 0851  SpO2 97 % 07/07/23 0851  Vitals shown include unfiled device data.  Last Pain:  Vitals:   07/07/23 0722  TempSrc: Tympanic  PainSc: 0-No pain         Complications: No notable events documented.

## 2023-07-07 NOTE — Discharge Instructions (Addendum)
YOU HAD AN ENDOSCOPIC PROCEDURE TODAY: Refer to the procedure report and other information in the discharge instructions given to you for any specific questions about what was found during the examination. If this information does not answer your questions, please call [place] office at [phone] to clarify.   YOU SHOULD EXPECT: Some feelings of bloating in the abdomen. Passage of more gas than usual. Walking can help get rid of the air that was put into your GI tract during the procedure and reduce the bloating. If you had a lower endoscopy (such as a colonoscopy or flexible sigmoidoscopy) you may notice spotting of blood in your stool or on the toilet paper. Some abdominal soreness may be present for a day or two, also.  DIET: Your first meal following the procedure should be a light meal and then it is ok to progress to your normal diet. A half-sandwich or bowl of soup is an example of a good first meal. Heavy or fried foods are harder to digest and may make you feel nauseous or bloated. Drink plenty of fluids but you should avoid alcoholic beverages for 24 hours. If you had a esophageal dilation, please see attached instructions for diet.    ACTIVITY: Your care partner should take you home directly after the procedure. You should plan to take it easy, moving slowly for the rest of the day. You can resume normal activity the day after the procedure however YOU SHOULD NOT DRIVE, use power tools, machinery or perform tasks that involve climbing or major physical exertion for 24 hours (because of the sedation medicines used during the test).   SYMPTOMS TO REPORT IMMEDIATELY: A gastroenterologist can be reached at any hour. Please call [phone]  for any of the following symptoms:  Following lower endoscopy (colonoscopy, flexible sigmoidoscopy) Excessive amounts of blood in the stool  Significant tenderness, worsening of abdominal pains  Swelling of the abdomen that is new, acute  Fever of 100 or  higher  Following upper endoscopy (EGD, EUS, ERCP, esophageal dilation) Vomiting of blood or coffee ground material  New, significant abdominal pain  New, significant chest pain or pain under the shoulder blades  Painful or persistently difficult swallowing  New shortness of breath  Black, tarry-looking or red, bloody stools  FOLLOW UP:  If any biopsies were taken you will be contacted by phone or by letter within the next 1-3 weeks. Call [phone]  if you have not heard about the biopsies in 3 weeks.  Please also call with any specific questions about appointments or follow up tests.YOU HAD AN ENDOSCOPIC PROCEDURE TODAY: Refer to the procedure report and other information in the discharge instructions given to you for any specific questions about what was found during the examination. If this information does not answer your questions, please call [place] office at [phone] to clarify.   YOU SHOULD EXPECT: Some feelings of bloating in the abdomen. Passage of more gas than usual. Walking can help get rid of the air that was put into your GI tract during the procedure and reduce the bloating. If you had a lower endoscopy (such as a colonoscopy or flexible sigmoidoscopy) you may notice spotting of blood in your stool or on the toilet paper. Some abdominal soreness may be present for a day or two, also.  DIET: Your first meal following the procedure should be a light meal and then it is ok to progress to your normal diet. A half-sandwich or bowl of soup is an example of a  good first meal. Heavy or fried foods are harder to digest and may make you feel nauseous or bloated. Drink plenty of fluids but you should avoid alcoholic beverages for 24 hours. If you had a esophageal dilation, please see attached instructions for diet.    ACTIVITY: Your care partner should take you home directly after the procedure. You should plan to take it easy, moving slowly for the rest of the day. You can resume  normal activity the day after the procedure however YOU SHOULD NOT DRIVE, use power tools, machinery or perform tasks that involve climbing or major physical exertion for 24 hours (because of the sedation medicines used during the test).   SYMPTOMS TO REPORT IMMEDIATELY: A gastroenterologist can be reached at any hour. Please call [phone]  for any of the following symptoms:  Following lower endoscopy (colonoscopy, flexible sigmoidoscopy) Excessive amounts of blood in the stool  Significant tenderness, worsening of abdominal pains  Swelling of the abdomen that is new, acute  Fever of 100 or higher  Following upper endoscopy (EGD, EUS, ERCP, esophageal dilation) Vomiting of blood or coffee ground material  New, significant abdominal pain  New, significant chest pain or pain under the shoulder blades  Painful or persistently difficult swallowing  New shortness of breath  Black, tarry-looking or red, bloody stools  FOLLOW UP:  If any biopsies were taken you will be contacted by phone or by letter within the next 1-3 weeks. Call [phone]  if you have not heard about the biopsies in 3 weeks.  Please also call with any specific questions about appointments or follow up tests.

## 2023-07-07 NOTE — Anesthesia Procedure Notes (Signed)
Procedure Name: MAC Date/Time: 07/07/2023 8:08 AM  Performed by: Nelle Don, CRNAPre-anesthesia Checklist: Patient identified, Emergency Drugs available, Patient being monitored and Suction available Oxygen Delivery Method: Simple face mask

## 2023-07-08 LAB — SURGICAL PATHOLOGY

## 2023-07-08 NOTE — Anesthesia Postprocedure Evaluation (Signed)
Anesthesia Post Note  Patient: Brent Wallace  Procedure(s) Performed: COLONOSCOPY WITH PROPOFOL POLYPECTOMY     Patient location during evaluation: PACU Anesthesia Type: MAC Level of consciousness: awake and alert Pain management: pain level controlled Vital Signs Assessment: post-procedure vital signs reviewed and stable Respiratory status: spontaneous breathing, nonlabored ventilation, respiratory function stable and patient connected to nasal cannula oxygen Cardiovascular status: stable and blood pressure returned to baseline Postop Assessment: no apparent nausea or vomiting Anesthetic complications: no   No notable events documented.  Last Vitals:  Vitals:   07/07/23 0900 07/07/23 0905  BP: (!) 114/56 114/78  Pulse: 88 83  Resp: 14 15  Temp:    SpO2: 94% 94%    Last Pain:  Vitals:   07/07/23 0905  TempSrc:   PainSc: 0-No pain   Pain Goal:                   Kennieth Rad

## 2023-07-09 ENCOUNTER — Encounter (HOSPITAL_COMMUNITY): Payer: Self-pay | Admitting: Gastroenterology

## 2023-07-11 ENCOUNTER — Encounter: Payer: Self-pay | Admitting: Gastroenterology

## 2023-07-14 DIAGNOSIS — M25651 Stiffness of right hip, not elsewhere classified: Secondary | ICD-10-CM | POA: Diagnosis not present

## 2023-07-14 DIAGNOSIS — Z7409 Other reduced mobility: Secondary | ICD-10-CM | POA: Diagnosis not present

## 2023-07-14 DIAGNOSIS — R29898 Other symptoms and signs involving the musculoskeletal system: Secondary | ICD-10-CM | POA: Diagnosis not present

## 2023-07-14 DIAGNOSIS — M25551 Pain in right hip: Secondary | ICD-10-CM | POA: Diagnosis not present

## 2023-07-25 ENCOUNTER — Other Ambulatory Visit: Payer: Self-pay | Admitting: Nurse Practitioner

## 2023-07-25 ENCOUNTER — Other Ambulatory Visit: Payer: Self-pay | Admitting: Internal Medicine

## 2023-07-25 DIAGNOSIS — I10 Essential (primary) hypertension: Secondary | ICD-10-CM

## 2023-07-25 NOTE — Telephone Encounter (Signed)
Requesting: CARVEDILOL 3.125 MG TABLET  Last Visit: 02/24/2023 Next Visit: 09/01/2023 Last Refill: 08/27/2022  Please Advise

## 2023-07-28 DIAGNOSIS — Z7409 Other reduced mobility: Secondary | ICD-10-CM | POA: Diagnosis not present

## 2023-07-28 DIAGNOSIS — M25651 Stiffness of right hip, not elsewhere classified: Secondary | ICD-10-CM | POA: Diagnosis not present

## 2023-07-28 DIAGNOSIS — R29898 Other symptoms and signs involving the musculoskeletal system: Secondary | ICD-10-CM | POA: Diagnosis not present

## 2023-07-28 DIAGNOSIS — M25551 Pain in right hip: Secondary | ICD-10-CM | POA: Diagnosis not present

## 2023-08-11 DIAGNOSIS — Z7409 Other reduced mobility: Secondary | ICD-10-CM | POA: Diagnosis not present

## 2023-08-11 DIAGNOSIS — M25651 Stiffness of right hip, not elsewhere classified: Secondary | ICD-10-CM | POA: Diagnosis not present

## 2023-08-11 DIAGNOSIS — R29898 Other symptoms and signs involving the musculoskeletal system: Secondary | ICD-10-CM | POA: Diagnosis not present

## 2023-08-11 DIAGNOSIS — M25551 Pain in right hip: Secondary | ICD-10-CM | POA: Diagnosis not present

## 2023-08-18 DIAGNOSIS — M25552 Pain in left hip: Secondary | ICD-10-CM | POA: Diagnosis not present

## 2023-08-18 DIAGNOSIS — M160 Bilateral primary osteoarthritis of hip: Secondary | ICD-10-CM | POA: Diagnosis not present

## 2023-08-28 ENCOUNTER — Other Ambulatory Visit: Payer: Self-pay | Admitting: Internal Medicine

## 2023-09-01 ENCOUNTER — Encounter: Payer: Self-pay | Admitting: Nurse Practitioner

## 2023-09-01 ENCOUNTER — Ambulatory Visit (INDEPENDENT_AMBULATORY_CARE_PROVIDER_SITE_OTHER): Payer: Medicare Other | Admitting: Nurse Practitioner

## 2023-09-01 VITALS — BP 122/78 | HR 78 | Temp 97.3°F | Ht 70.0 in | Wt 363.0 lb

## 2023-09-01 DIAGNOSIS — I10 Essential (primary) hypertension: Secondary | ICD-10-CM

## 2023-09-01 DIAGNOSIS — Z794 Long term (current) use of insulin: Secondary | ICD-10-CM

## 2023-09-01 DIAGNOSIS — Z87891 Personal history of nicotine dependence: Secondary | ICD-10-CM

## 2023-09-01 DIAGNOSIS — R351 Nocturia: Secondary | ICD-10-CM

## 2023-09-01 DIAGNOSIS — E785 Hyperlipidemia, unspecified: Secondary | ICD-10-CM | POA: Diagnosis not present

## 2023-09-01 DIAGNOSIS — E1169 Type 2 diabetes mellitus with other specified complication: Secondary | ICD-10-CM | POA: Diagnosis not present

## 2023-09-01 DIAGNOSIS — Z23 Encounter for immunization: Secondary | ICD-10-CM

## 2023-09-01 DIAGNOSIS — E1121 Type 2 diabetes mellitus with diabetic nephropathy: Secondary | ICD-10-CM | POA: Insufficient documentation

## 2023-09-01 LAB — RENAL FUNCTION PANEL
Albumin: 4.4 g/dL (ref 3.5–5.2)
BUN: 18 mg/dL (ref 6–23)
CO2: 30 meq/L (ref 19–32)
Calcium: 9.5 mg/dL (ref 8.4–10.5)
Chloride: 97 meq/L (ref 96–112)
Creatinine, Ser: 0.98 mg/dL (ref 0.40–1.50)
GFR: 81.7 mL/min (ref >60.00)
Glucose, Bld: 187 mg/dL — ABNORMAL HIGH (ref 70–99)
Phosphorus: 3.2 mg/dL (ref 2.3–4.6)
Potassium: 4.4 meq/L (ref 3.5–5.1)
Sodium: 136 meq/L (ref 135–145)

## 2023-09-01 LAB — LIPID PANEL
Cholesterol: 106 mg/dL (ref 0–200)
HDL: 48.1 mg/dL (ref >39.00)
LDL Cholesterol: 40 mg/dL (ref 0–99)
NonHDL: 57.59
Total CHOL/HDL Ratio: 2
Triglycerides: 88 mg/dL (ref 0.0–149.0)
VLDL: 17.6 mg/dL (ref 0.0–40.0)

## 2023-09-01 LAB — PSA: PSA: 0.63 ng/mL (ref 0.10–4.00)

## 2023-09-01 LAB — MICROALBUMIN / CREATININE URINE RATIO
Creatinine,U: 99.8 mg/dL
Microalb Creat Ratio: 82.8 mg/g — ABNORMAL HIGH (ref 0.0–30.0)
Microalb, Ur: 8.3 mg/dL — ABNORMAL HIGH (ref 0.0–1.9)

## 2023-09-01 NOTE — Assessment & Plan Note (Signed)
Current use of atorvastatin Repeat lipid panel

## 2023-09-01 NOTE — Assessment & Plan Note (Signed)
 hgbA1c at 7.8%, current use of ozempic 2mg , and metformin Followed by endocrinology next appointment 09/2023 Advised to schedule appointment with ophthalmology for annual DIABETES exam Negative neuropathy Positive nephropathy: Calculated UACr for last 37yrs: 115.09mg /g, 251.17mg /g Unable to use SGLT-2 due to previous adverse reaction to Invokanna-acute pancreatitis 2013 Current use of an ARB and GLP-1RA  Repeat BMP, UACr, and urine protein/creatinine today Entered referral to nephrology F/up in 6months

## 2023-09-01 NOTE — Assessment & Plan Note (Addendum)
 15pack years, quit in 2021 Last CT chest 2023 and 2024: no nodules, Bibasilar scarring/atelectasis. Bilateral lower lobe bronchiectasis/bronchiolectasis and associated mild architectural distortion. Trace right pleural thickening. The central airways are patent. No pleural effusion or pneumothorax.  Agreed to repeat Ct chest for lung cancer screen.

## 2023-09-01 NOTE — Progress Notes (Signed)
 Established Patient Visit  Patient: Brent Wallace   DOB: 01-16-1961   63 y.o. Male  MRN: [id number] Visit Date: 09/01/2023  Subjective:    Chief Complaint  Patient presents with   Follow-up    6 month f/u  Medication refills requested-sildenafil   HPI Accompanied by wife: [name]  Hypertension Home PM BP readings: 150/98, 152/103, 150/96, 132/82 No adverse effects with coreg, benicar/hydrochlorothiazide, and spironolactone. BP Readings from Last 3 Encounters:  09/01/23 122/78  07/07/23 114/78  04/21/23 138/80    Maintain med doses Repeat BMP  DM (diabetes mellitus) (HCC) hgbA1c at 7.8%, current use of ozempic 2mg , and metformin Followed by endocrinology next appointment 09/2023 Advised to schedule appointment with ophthalmology for annual DIABETES exam Negative neuropathy Positive nephropathy: Calculated UACr for last 7yrs: 115.09mg /g, 251.17mg /g Unable to use SGLT-2 due to previous adverse reaction to Invokanna-acute pancreatitis 2013 Current use of an ARB and GLP-1RA  Repeat BMP, UACr, and urine protein/creatinine today Entered referral to nephrology F/up in 6months  Hyperlipidemia associated with type 2 diabetes mellitus (HCC) Current use of atorvastatin Repeat lipid panel  Former tobacco use 15pack years, quit in 2021 Last CT chest 2023 and 2024: no nodules, Bibasilar scarring/atelectasis. Bilateral lower lobe bronchiectasis/bronchiolectasis and associated mild architectural distortion. Trace right pleural thickening. The central airways are patent. No pleural effusion or pneumothorax.  Agreed to repeat Ct chest for lung cancer screen.  Outpatient PT via [facility]. Unable to start water aerobics due to wife's work schedule.  Reviewed medical, surgical, and social history today  Medications: Outpatient Medications Prior to Visit  Medication Sig   acetaminophen (TYLENOL) 500 MG tablet Take 1,000 mg by mouth every 6 (six) hours as needed  for mild pain (pain score 1-3) or moderate pain (pain score 4-6).   atorvastatin (LIPITOR) 20 MG tablet TAKE 1 TABLET BY MOUTH THREE TIMES A WEEK.   carvedilol (COREG) 3.125 MG tablet TAKE 1 TABLET BY MOUTH TWICE A DAY WITH FOOD   cholecalciferol (VITAMIN D) 1000 units tablet Take 2,000 Units by mouth daily.   Continuous Glucose Sensor (FREESTYLE LIBRE 3 SENSOR) MISC 1 EACH BY DOES NOT APPLY ROUTE EVERY 14 (FOURTEEN) DAYS.   DULoxetine (CYMBALTA) 60 MG capsule Take 1 capsule (60 mg total) by mouth daily.   insulin glargine (LANTUS SOLOSTAR) 100 UNIT/ML Solostar Pen INJECT 14-18 UNITS INTO THE SKIN AT BEDTIME.   Insulin Lispro-aabc (LYUMJEV KWIKPEN) 100 UNIT/ML KwikPen INJECT 10-20 UNITS INTO SKIN 3 TIMES A DAY BEFORE MEALS   Insulin Pen Needle (BD PEN NEEDLE NANO 2ND GEN) 32G X 4 MM MISC USE AS DIRECTED EVERY DAY   metFORMIN (GLUCOPHAGE-XR) 500 MG 24 hr tablet Take 1 tablet (500 mg total) by mouth 2 (two) times daily.   olmesartan-hydrochlorothiazide (BENICAR HCT) 40-12.5 MG tablet TAKE 1 TABLET BY MOUTH EVERY DAY   OneTouch Delica Lancets 33G MISC Use 2x a day   ONETOUCH ULTRA test strip 1 EACH BY OTHER ROUTE IN THE MORNING, AT NOON, AND AT BEDTIME. ONE TOUCH ULTRA   OVER THE COUNTER MEDICATION Delta * gummy at bedtime with a small amount of THC   Semaglutide, 2 MG/DOSE, (OZEMPIC, 2 MG/DOSE,) 8 MG/3ML SOPN INJECT 2 MG INTO THE SKIN ONCE A WEEK.   sildenafil (VIAGRA) 100 MG tablet Take 0.5-1 tab 30 minutes prior to sexual activity as needed.   Sod Picosulfate-Mag Ox-Cit Acd (CLENPIQ) 10-3.5-12 MG-GM -GM/160ML SOLN Take 1 kit  by mouth as directed. (Patient not taking: Reported on 09/01/2023)   spironolactone (ALDACTONE) 25 MG tablet Take 1 tablet (25 mg total) by mouth daily.   No facility-administered medications prior to visit.   Reviewed past medical and social history.   ROS per HPI above      Objective:  BP 122/78 (BP Location: Left Arm, Patient Position: Sitting, Cuff Size: Large)    Pulse 78   Temp (!) 97.3 F (36.3 C) (Temporal)   Ht 5\' 10"  (1.778 m)   Wt (!) 363 lb (164.7 kg)   SpO2 95%   BMI 52.09 kg/m      Physical Exam Vitals and nursing note reviewed.  Cardiovascular:     Rate and Rhythm: Normal rate and regular rhythm.     Pulses: Normal pulses.     Heart sounds: Normal heart sounds.  Pulmonary:     Effort: Pulmonary effort is normal.     Breath sounds: Normal breath sounds.  Musculoskeletal:     Right lower leg: No edema.     Left lower leg: No edema.  Neurological:     Mental Status: He is alert and oriented to person, place, and time.     No results found for any visits on 09/01/23.    Assessment & Plan:    Problem List Items Addressed This Visit     Diabetic nephropathy associated with type 2 diabetes mellitus (HCC)   Relevant Orders   Ambulatory referral to Nephrology   DM (diabetes mellitus) (HCC) - Primary   hgbA1c at 7.8%, current use of ozempic 2mg , and metformin Followed by endocrinology next appointment 09/2023 Advised to schedule appointment with ophthalmology for annual DIABETES exam Negative neuropathy Positive nephropathy: Calculated UACr for last 72yrs: 115.09mg /g, 251.17mg /g Unable to use SGLT-2 due to previous adverse reaction to Invokanna-acute pancreatitis 2013 Current use of an ARB and GLP-1RA  Repeat BMP, UACr, and urine protein/creatinine today Entered referral to nephrology F/up in 6months      Relevant Orders   Urine microalbumin-creatinine with uACR   Renal function panel with eGFR   Protein / Creatinine Ratio, Urine   Ambulatory referral to Nephrology   Former tobacco use   15pack years, quit in 2021 Last CT chest 2023 and 2024: no nodules, Bibasilar scarring/atelectasis. Bilateral lower lobe bronchiectasis/bronchiolectasis and associated mild architectural distortion. Trace right pleural thickening. The central airways are patent. No pleural effusion or pneumothorax.  Agreed to repeat Ct chest for lung  cancer screen.      Relevant Orders   CT CHEST LUNG CANCER SCREENING LOW DOSE WO CONTRAST   Hyperlipidemia associated with type 2 diabetes mellitus (HCC)   Current use of atorvastatin Repeat lipid panel      Relevant Orders   Renal function panel with eGFR   Lipid panel   Hypertension   Home PM BP readings: 150/98, 152/103, 150/96, 132/82 No adverse effects with coreg, benicar/hydrochlorothiazide, and spironolactone. BP Readings from Last 3 Encounters:  09/01/23 122/78  07/07/23 114/78  04/21/23 138/80    Maintain med doses Repeat BMP      Relevant Orders   Renal function panel with eGFR   Other Visit Diagnoses       Nocturia       Relevant Orders   PSA     Immunization due       Relevant Orders   Zoster Recombinant (Shingrix ) (Completed)   Pneumococcal conjugate vaccine 20-valent (Prevnar 20) (Completed)      Return in about  6 months (around 03/03/2024) for HTN, DM, hyperlipidemia (fasting).     Alysia Penna, NP

## 2023-09-01 NOTE — Patient Instructions (Signed)
 Go to lab You will be contacted to schedule appointment with nephrology and for Ct chest. Schedule nurse visit for 2nd shingrix vaccine in 2months. Maintain current med doses

## 2023-09-01 NOTE — Assessment & Plan Note (Signed)
 Home PM BP readings: 150/98, 152/103, 150/96, 132/82 No adverse effects with coreg, benicar/hydrochlorothiazide, and spironolactone. BP Readings from Last 3 Encounters:  09/01/23 122/78  07/07/23 114/78  04/21/23 138/80    Maintain med doses Repeat BMP

## 2023-09-02 ENCOUNTER — Encounter: Payer: Self-pay | Admitting: Nurse Practitioner

## 2023-09-02 LAB — PROTEIN / CREATININE RATIO, URINE
Creatinine, Urine: 104 mg/dL (ref 20–320)
Protein/Creat Ratio: 173 mg/g{creat} — ABNORMAL HIGH (ref 25–148)
Protein/Creatinine Ratio: 0.173 mg/mg{creat} — ABNORMAL HIGH (ref 0.025–0.148)
Total Protein, Urine: 18 mg/dL (ref 5–25)

## 2023-09-03 ENCOUNTER — Other Ambulatory Visit: Payer: Self-pay | Admitting: Nurse Practitioner

## 2023-09-03 DIAGNOSIS — F3342 Major depressive disorder, recurrent, in full remission: Secondary | ICD-10-CM

## 2023-09-03 DIAGNOSIS — M48061 Spinal stenosis, lumbar region without neurogenic claudication: Secondary | ICD-10-CM

## 2023-09-03 DIAGNOSIS — M25551 Pain in right hip: Secondary | ICD-10-CM

## 2023-09-15 ENCOUNTER — Encounter: Payer: Self-pay | Admitting: Internal Medicine

## 2023-09-15 ENCOUNTER — Ambulatory Visit (INDEPENDENT_AMBULATORY_CARE_PROVIDER_SITE_OTHER): Payer: Medicare Other | Admitting: Internal Medicine

## 2023-09-15 VITALS — BP 122/80 | HR 88 | Ht 70.0 in | Wt 361.8 lb

## 2023-09-15 DIAGNOSIS — Z7985 Long-term (current) use of injectable non-insulin antidiabetic drugs: Secondary | ICD-10-CM

## 2023-09-15 DIAGNOSIS — E1169 Type 2 diabetes mellitus with other specified complication: Secondary | ICD-10-CM

## 2023-09-15 DIAGNOSIS — Z7984 Long term (current) use of oral hypoglycemic drugs: Secondary | ICD-10-CM

## 2023-09-15 DIAGNOSIS — Z794 Long term (current) use of insulin: Secondary | ICD-10-CM | POA: Diagnosis not present

## 2023-09-15 DIAGNOSIS — N521 Erectile dysfunction due to diseases classified elsewhere: Secondary | ICD-10-CM

## 2023-09-15 DIAGNOSIS — E66813 Obesity, class 3: Secondary | ICD-10-CM

## 2023-09-15 DIAGNOSIS — Z6841 Body Mass Index (BMI) 40.0 and over, adult: Secondary | ICD-10-CM

## 2023-09-15 DIAGNOSIS — E785 Hyperlipidemia, unspecified: Secondary | ICD-10-CM

## 2023-09-15 DIAGNOSIS — E1159 Type 2 diabetes mellitus with other circulatory complications: Secondary | ICD-10-CM

## 2023-09-15 LAB — POCT GLYCOSYLATED HEMOGLOBIN (HGB A1C): Hemoglobin A1C: 7.9 % — AB (ref 4.0–5.6)

## 2023-09-15 MED ORDER — EMPAGLIFLOZIN 25 MG PO TABS
25.0000 mg | ORAL_TABLET | Freq: Every day | ORAL | 3 refills | Status: AC
Start: 2023-09-15 — End: ?

## 2023-09-15 NOTE — Patient Instructions (Addendum)
 Please continue: - Metformin XR 500 mg 2x a day, with meals - Ozempic 2 mg weekly in a.m.  Change: - Lyumjev 15-25 units before meals   Please increase: - Lantus 20 units at night  Try to start: - Jardiance 25 mg before b'fast   Please return in 4 months.
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
## 2023-10-06 DIAGNOSIS — H469 Unspecified optic neuritis: Secondary | ICD-10-CM | POA: Diagnosis not present

## 2023-10-06 DIAGNOSIS — E119 Type 2 diabetes mellitus without complications: Secondary | ICD-10-CM | POA: Diagnosis not present

## 2023-10-06 DIAGNOSIS — H2511 Age-related nuclear cataract, right eye: Secondary | ICD-10-CM | POA: Diagnosis not present

## 2023-10-06 LAB — OPHTHALMOLOGY REPORT-SCANNED

## 2023-10-20 ENCOUNTER — Encounter (HOSPITAL_BASED_OUTPATIENT_CLINIC_OR_DEPARTMENT_OTHER): Payer: Self-pay | Admitting: Adult Health

## 2023-10-20 ENCOUNTER — Ambulatory Visit (HOSPITAL_BASED_OUTPATIENT_CLINIC_OR_DEPARTMENT_OTHER): Admitting: Adult Health

## 2023-10-20 VITALS — BP 128/99 | HR 77 | Ht 70.0 in | Wt 350.0 lb

## 2023-10-20 DIAGNOSIS — E66813 Obesity, class 3: Secondary | ICD-10-CM

## 2023-10-20 DIAGNOSIS — Z6841 Body Mass Index (BMI) 40.0 and over, adult: Secondary | ICD-10-CM

## 2023-10-20 DIAGNOSIS — Z87891 Personal history of nicotine dependence: Secondary | ICD-10-CM

## 2023-10-20 DIAGNOSIS — G4733 Obstructive sleep apnea (adult) (pediatric): Secondary | ICD-10-CM

## 2023-10-20 NOTE — Patient Instructions (Signed)
 Continue on CPAP at bedtime. Keep up the good work Change CPAP pressure to 20 cm H2O. Continue to work on healthy weight loss Activity as tolerated Do not drive if sleepy Continue with yearly CT chest screening program.-Call to schedule your CT scan as discussed  Follow-up with Dr. Villa Greaser in 1 year and as needed

## 2023-10-20 NOTE — Assessment & Plan Note (Signed)
 Due for lung cancer CT chest screening.-Patient plans to call to reschedule

## 2023-10-20 NOTE — Assessment & Plan Note (Signed)
 Excellent control and compliance on nocturnal CPAP. Will adjust CPAP pressure for comfort.  Changed to set pressure at 20 cm H2O. Order for new supplies sent to DME Plan  Patient Instructions  Continue on CPAP at bedtime. Keep up the good work Change CPAP pressure to 20 cm H2O. Continue to work on healthy weight loss Activity as tolerated Do not drive if sleepy Continue with yearly CT chest screening program.-Call to schedule your CT scan as discussed  Follow-up with Dr. Villa Greaser in 1 year and as needed
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
## 2023-10-20 NOTE — Assessment & Plan Note (Signed)
 Healthy weight loss discussed

## 2023-11-04 ENCOUNTER — Ambulatory Visit (INDEPENDENT_AMBULATORY_CARE_PROVIDER_SITE_OTHER)

## 2023-11-04 DIAGNOSIS — Z23 Encounter for immunization: Secondary | ICD-10-CM | POA: Diagnosis not present

## 2023-11-04 NOTE — Progress Notes (Signed)
 Patient presents for his second Shingrix  vaccine. Injection placed in right deltoid region by Rockford Churches, CMA. Patient tolerated procedure well with no concerns.

## 2023-11-17 DIAGNOSIS — E559 Vitamin D deficiency, unspecified: Secondary | ICD-10-CM | POA: Diagnosis not present

## 2023-11-17 DIAGNOSIS — R809 Proteinuria, unspecified: Secondary | ICD-10-CM | POA: Diagnosis not present

## 2023-11-17 DIAGNOSIS — N182 Chronic kidney disease, stage 2 (mild): Secondary | ICD-10-CM | POA: Diagnosis not present

## 2023-11-17 DIAGNOSIS — I129 Hypertensive chronic kidney disease with stage 1 through stage 4 chronic kidney disease, or unspecified chronic kidney disease: Secondary | ICD-10-CM | POA: Diagnosis not present

## 2023-11-25 ENCOUNTER — Other Ambulatory Visit: Payer: Self-pay | Admitting: Nurse Practitioner

## 2023-11-25 DIAGNOSIS — I10 Essential (primary) hypertension: Secondary | ICD-10-CM

## 2023-12-25 ENCOUNTER — Other Ambulatory Visit: Payer: Self-pay | Admitting: Internal Medicine

## 2023-12-25 DIAGNOSIS — E1159 Type 2 diabetes mellitus with other circulatory complications: Secondary | ICD-10-CM

## 2024-01-19 ENCOUNTER — Encounter: Payer: Self-pay | Admitting: Internal Medicine

## 2024-01-19 ENCOUNTER — Ambulatory Visit (INDEPENDENT_AMBULATORY_CARE_PROVIDER_SITE_OTHER): Admitting: Internal Medicine

## 2024-01-19 VITALS — BP 122/70 | HR 88 | Ht 70.0 in | Wt 356.6 lb

## 2024-01-19 DIAGNOSIS — Z7985 Long-term (current) use of injectable non-insulin antidiabetic drugs: Secondary | ICD-10-CM

## 2024-01-19 DIAGNOSIS — E1159 Type 2 diabetes mellitus with other circulatory complications: Secondary | ICD-10-CM | POA: Diagnosis not present

## 2024-01-19 DIAGNOSIS — Z6841 Body Mass Index (BMI) 40.0 and over, adult: Secondary | ICD-10-CM

## 2024-01-19 DIAGNOSIS — E1169 Type 2 diabetes mellitus with other specified complication: Secondary | ICD-10-CM | POA: Diagnosis not present

## 2024-01-19 DIAGNOSIS — N521 Erectile dysfunction due to diseases classified elsewhere: Secondary | ICD-10-CM

## 2024-01-19 DIAGNOSIS — Z794 Long term (current) use of insulin: Secondary | ICD-10-CM

## 2024-01-19 DIAGNOSIS — E66813 Obesity, class 3: Secondary | ICD-10-CM

## 2024-01-19 DIAGNOSIS — E785 Hyperlipidemia, unspecified: Secondary | ICD-10-CM

## 2024-01-19 DIAGNOSIS — Z7984 Long term (current) use of oral hypoglycemic drugs: Secondary | ICD-10-CM

## 2024-01-19 LAB — POCT GLYCOSYLATED HEMOGLOBIN (HGB A1C): Hemoglobin A1C: 7.5 % — AB (ref 4.0–5.6)

## 2024-01-19 MED ORDER — FREESTYLE LIBRE 3 PLUS SENSOR MISC: 1.0000 | 3 refills | Status: AC

## 2024-01-19 MED ORDER — LANTUS SOLOSTAR 100 UNIT/ML ~~LOC~~ SOPN: 24.0000 [IU] | PEN_INJECTOR | Freq: Every day | SUBCUTANEOUS | Status: AC

## 2024-01-19 NOTE — Patient Instructions (Addendum)
 Please continue: - Metformin  XR 500 mg 2x a day, with meals - Jardiance  25 mg before b'fast - Ozempic  2 mg weekly in a.m. - Lyumjev  15-20 (25) units before meals   Try to increase: - Lantus  24 units at night   Download the new [employer]. Out code is [id number].   Please return in 3-4 months.
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
## 2024-01-28 ENCOUNTER — Ambulatory Visit: Payer: Self-pay

## 2024-01-28 NOTE — Telephone Encounter (Signed)
 FYI Only or Action Required?: FYI only for provider.  Patient was last seen in primary care on 09/01/2023 by Brent Wallace, Brent Rockford, NP.  Called Nurse Triage reporting Fever and Covid Positive.  Symptoms began a week ago.  Interventions attempted: OTC medications: Cold and Cough medication and Rest, hydration, or home remedies.  Symptoms are: unchanged.  Triage Disposition: See Physician Within 24 Hours, Call PCP Within 24 Hours  Patient/caregiver understands and will follow disposition?:   Copied from CRM #[id number]. Topic: Clinical - Red Word Triage >> Jan 28, 2024  4:00 PM Chiquita SQUIBB wrote: Red Word that prompted transfer to Nurse Triage: Patient wife is calling in stating the patient had tested positive for covid on Wednesday last week and he is still running a fever, he has been taking many over the counter medications with no help, patient also has diabetes so she is getting concerned. Reason for Disposition  [1] HIGH RISK patient (e.g., weak immune system, age > 64 years, obesity with BMI 30 or higher, pregnant, chronic lung disease or other chronic medical condition) AND [2] COVID symptoms (e.g., cough, fever)  (Exceptions: Already seen by PCP and no new or worsening symptoms.)  Fever present > 3 days (72 hours)  Answer Assessment - Initial Assessment Questions 1. TEMPERATURE: What is the most recent temperature?  How was it measured?      100.9-just took tylenol  1000 MG 2. ONSET: When did the fever start?      Started on Wednesday 3. CHILLS: Do you have chills? If yes: How bad are they?  (e.g., none, mild, moderate, severe)     yes 4. OTHER SYMPTOMS: Do you have any other symptoms besides the fever?  (e.g., abdomen pain, cough, diarrhea, earache, headache, sore throat, urination pain)     Productive cough, COVID positive last Wednesday. 5. CAUSE: If there are no symptoms, ask: What do you think is causing the fever?      NA 6. CONTACTS: Does anyone else in the family  have an infection?     Wife tested positive COVID as well 7. TREATMENT: What have you done so far to treat this fever? (e.g., OTC fever medicines)     OTC medication 8. IMMUNOCOMPROMISE: Do you have any of the following: diabetes, HIV positive, splenectomy, cancer chemotherapy, chronic steroid treatment, transplant patient, etc.?     DM 10. TRAVEL: Have you traveled out of the country in the last month? (e.g., travel history, exposures)       no  Answer Assessment - Initial Assessment Questions 1. COVID-19 DIAGNOSIS: How do you know that you have COVID? (e.g., positive lab test or self-test, diagnosed by doctor or NP/PA, symptoms after exposure).     Positive home test 2. COVID-19 EXPOSURE: Was there any known exposure to COVID before the symptoms began? CDC Definition of close contact: within 6 feet (2 meters) for a total of 15 minutes or more over a 24-hour period.      Not that either patient or wife know 3. ONSET: When did the COVID-19 symptoms start?      Last Wednesday. 4. WORST SYMPTOM: What is your worst symptom? (e.g., cough, fever, shortness of breath, muscle aches)     Cough and fever 5. COUGH: Do you have a cough? If Yes, ask: How bad is the cough?       Productive cough 6. FEVER: Do you have a fever? If Yes, ask: What is your temperature, how was it measured, and when did it start?  100.9 7. RESPIRATORY STATUS: Describe your breathing? (e.g., normal; shortness of breath, wheezing, unable to speak)      Normal breathing 8. BETTER-SAME-WORSE: Are you getting better, staying the same or getting worse compared to yesterday?  If getting worse, ask, In what way?     Patient states it seems like a cycle-feels better at one point and then gets hit with a fever.  9. OTHER SYMPTOMS: Do you have any other symptoms?  (e.g., chills, fatigue, headache, loss of smell or taste, muscle pain, sore throat)     Fatigue,  10. HIGH RISK DISEASE: Do you have any  chronic medical problems? (e.g., asthma, heart or lung disease, weak immune system, obesity, etc.)       DM 11. VACCINE: Have you had the COVID-19 vaccine? If Yes, ask: Which one, how many shots, when did you get it?       Received the original two 13. O2 SATURATION MONITOR:  Do you use an oxygen saturation monitor (pulse oximeter) at home? If Yes, ask What is your reading (oxygen level) today? What is your usual oxygen saturation reading? (e.g., 95%)       93%  Discussed with patient the option to be checked out at Urgent Care today vs an appointment tomorrow in the office. Patient states he would prefer to be seen tomorrow. Appointment scheduled for 3:20 PM with another provider for tomorrow in the office.  Protocols used: Fever-A-AH, Coronavirus (COVID-19) Diagnosed or Suspected-A-AH

## 2024-01-29 ENCOUNTER — Ambulatory Visit (INDEPENDENT_AMBULATORY_CARE_PROVIDER_SITE_OTHER): Admitting: Family Medicine

## 2024-01-29 ENCOUNTER — Encounter: Payer: Self-pay | Admitting: Family Medicine

## 2024-01-29 VITALS — BP 108/72 | HR 76 | Temp 99.0°F | Ht 70.0 in | Wt 344.2 lb

## 2024-01-29 DIAGNOSIS — J22 Unspecified acute lower respiratory infection: Secondary | ICD-10-CM

## 2024-01-29 DIAGNOSIS — E1169 Type 2 diabetes mellitus with other specified complication: Secondary | ICD-10-CM

## 2024-01-29 DIAGNOSIS — Z794 Long term (current) use of insulin: Secondary | ICD-10-CM | POA: Diagnosis not present

## 2024-01-29 LAB — GLUCOSE, POCT (MANUAL RESULT ENTRY): POC Glucose: 122 mg/dL — AB (ref 70–99)

## 2024-01-29 MED ORDER — DOXYCYCLINE HYCLATE 100 MG PO TABS: 100.0000 mg | ORAL_TABLET | Freq: Two times a day (BID) | ORAL | 0 refills | Status: AC

## 2024-01-29 NOTE — Progress Notes (Signed)
 Established Patient Office Visit   Subjective:  Patient ID: Brent Wallace, male    DOB: 1960-10-06  Age: 63 y.o. MRN: [id number]  No chief complaint on file.   HPI Encounter Diagnoses  Name Primary?   Type 2 diabetes mellitus with other specified complication, with long-term current use of insulin  (HCC) Yes   Lower resp. tract infection    Home COVID test was +8 days ago.  Slowly recovering but is left with a lingering cough productive of tan phlegm.  There have been elevated temperatures without fevers or chills.  No recent antipyrtics.  Denies wheezing or difficulty breathing.  Ongoing issue with some dyspnea on exertion.   Review of Systems  Constitutional: Negative.   HENT: Negative.    Eyes:  Negative for blurred vision, discharge and redness.  Respiratory:  Positive for cough and sputum production. Negative for hemoptysis and wheezing.   Cardiovascular: Negative.   Gastrointestinal:  Negative for abdominal pain.  Genitourinary: Negative.   Musculoskeletal: Negative.  Negative for myalgias.  Skin:  Negative for rash.  Neurological:  Negative for tingling, loss of consciousness and weakness.  Endo/Heme/Allergies:  Negative for polydipsia.     Current Outpatient Medications:    acetaminophen  (TYLENOL ) 500 MG tablet, Take 1,000 mg by mouth every 6 (six) hours as needed for mild pain (pain score 1-3) or moderate pain (pain score 4-6)., Disp: , Rfl:    atorvastatin  (LIPITOR) 20 MG tablet, TAKE 1 TABLET BY MOUTH THREE TIMES A WEEK., Disp: 36 tablet, Rfl: 1   carvedilol  (COREG ) 3.125 MG tablet, TAKE 1 TABLET BY MOUTH TWICE A DAY WITH FOOD, Disp: 180 tablet, Rfl: 1   cholecalciferol (VITAMIN D) 1000 units tablet, Take 2,000 Units by mouth daily., Disp: , Rfl:    Continuous Glucose Sensor (FREESTYLE LIBRE 3 PLUS SENSOR) MISC, Inject 1 Device into the skin continuous. Change every 15 days, Disp: 6 each, Rfl: 3   doxycycline  (VIBRA -TABS) 100 MG tablet, Take 1 tablet (100 mg total)  by mouth 2 (two) times daily for 10 days., Disp: 20 tablet, Rfl: 0   DULoxetine  (CYMBALTA ) 60 MG capsule, TAKE 1 CAPSULE BY MOUTH EVERY DAY, Disp: 90 capsule, Rfl: 1   empagliflozin  (JARDIANCE ) 25 MG TABS tablet, Take 1 tablet (25 mg total) by mouth daily before breakfast., Disp: 90 tablet, Rfl: 3   insulin  glargine (LANTUS  SOLOSTAR) 100 UNIT/ML Solostar Pen, Inject 24 Units into the skin at bedtime., Disp: , Rfl:    Insulin  Lispro-aabc (LYUMJEV  KWIKPEN) 100 UNIT/ML KwikPen, INJECT 10-20 UNITS INTO SKIN 3 TIMES A DAY BEFORE MEALS, Disp: , Rfl:    Insulin  Pen Needle (BD PEN NEEDLE NANO 2ND GEN) 32G X 4 MM MISC, USE AS DIRECTED EVERY DAY, Disp: 100 each, Rfl: 5   metFORMIN  (GLUCOPHAGE -XR) 500 MG 24 hr tablet, Take 1 tablet (500 mg total) by mouth 2 (two) times daily., Disp: 180 tablet, Rfl: 3   olmesartan -hydrochlorothiazide  (BENICAR  HCT) 40-12.5 MG tablet, TAKE 1 TABLET BY MOUTH EVERY DAY, Disp: 90 tablet, Rfl: 3   OneTouch Delica Lancets 33G MISC, Use 2x a day, Disp: 100 each, Rfl: 11   ONETOUCH ULTRA test strip, 1 EACH BY OTHER ROUTE IN THE MORNING, AT NOON, AND AT BEDTIME. ONE TOUCH ULTRA, Disp: 100 strip, Rfl: 12   OVER THE COUNTER MEDICATION, Delta * gummy at bedtime with a small amount of THC, Disp: , Rfl:    Semaglutide , 2 MG/DOSE, (OZEMPIC , 2 MG/DOSE,) 8 MG/3ML SOPN, INJECT 2 MG INTO THE SKIN  ONCE A WEEK., Disp: 6 mL, Rfl: 1   sildenafil  (VIAGRA ) 100 MG tablet, Take 0.5-1 tab 30 minutes prior to sexual activity as needed., Disp: 10 tablet, Rfl: 0   Sod Picosulfate-Mag Ox-Cit Acd (CLENPIQ ) 10-3.5-12 MG-GM -GM/160ML SOLN, Take 1 kit by mouth as directed., Disp: , Rfl:    spironolactone  (ALDACTONE ) 25 MG tablet, TAKE 1 TABLET (25 MG TOTAL) BY MOUTH DAILY., Disp: 90 tablet, Rfl: 1   Objective:     BP 108/72 (BP Location: Right Arm, Patient Position: Sitting, Cuff Size: Large)   Pulse 76   Temp 99 F (37.2 C) (Oral)   Ht 5' 10 (1.778 m)   Wt (!) 344 lb 3.2 oz (156.1 kg)   SpO2 95%   BMI  49.39 kg/m    Physical Exam Constitutional:      General: He is not in acute distress.    Appearance: Normal appearance. He is not ill-appearing, toxic-appearing or diaphoretic.  HENT:     Head: Normocephalic and atraumatic.     Right Ear: External ear normal.     Left Ear: External ear normal.     Mouth/Throat:     Mouth: Mucous membranes are dry.  Eyes:     General: No scleral icterus.       Right eye: No discharge.        Left eye: No discharge.     Extraocular Movements: Extraocular movements intact.     Conjunctiva/sclera: Conjunctivae normal.     Pupils: Pupils are equal, round, and reactive to light.  Cardiovascular:     Rate and Rhythm: Normal rate and regular rhythm.  Pulmonary:     Effort: Pulmonary effort is normal. No respiratory distress.     Breath sounds: No wheezing or rales.  Abdominal:     General: Bowel sounds are normal.  Musculoskeletal:     Cervical back: No rigidity or tenderness.  Lymphadenopathy:     Cervical: No cervical adenopathy.  Skin:    General: Skin is warm and dry.  Neurological:     Mental Status: He is alert and oriented to person, place, and time.  Psychiatric:        Mood and Affect: Mood normal.        Behavior: Behavior normal.      Results for orders placed or performed in visit on 01/29/24  POCT glucose (manual entry)  Result Value Ref Range   POC Glucose 122 (A) 70 - 99 mg/dl      The ASCVD Risk score (Arnett DK, et al., 2019) failed to calculate for the following reasons:   The valid total cholesterol range is 130 to 320 mg/dL    Assessment & Plan:   Type 2 diabetes mellitus with other specified complication, with long-term current use of insulin  (HCC) -     POCT glucose (manual entry)  Lower resp. tract infection -     Doxycycline  Hyclate; Take 1 tablet (100 mg total) by mouth 2 (two) times daily for 10 days.  Dispense: 20 tablet; Refill: 0    Return in about 2 weeks (around 02/12/2024), or Please increase  hydration with 45 to 60 ounces of water daily.SABRA Elsie Sim Berneta, MD

## 2024-02-12 IMAGING — CT CT CHEST LUNG CANCER SCREENING LOW DOSE W/O CM
2 of 5 series · 14 of 40 positions shown, 17 images · non-contrast
Comparison: Plain film 12/02/2018

CLINICAL DATA: Twenty pack-year smoking history/quit 4 years ago



[Series 4: lung 1.00 br44 cor · coronal · 0.58mm/px · 3 of 478 slices shown]
[im 96/478  lung]
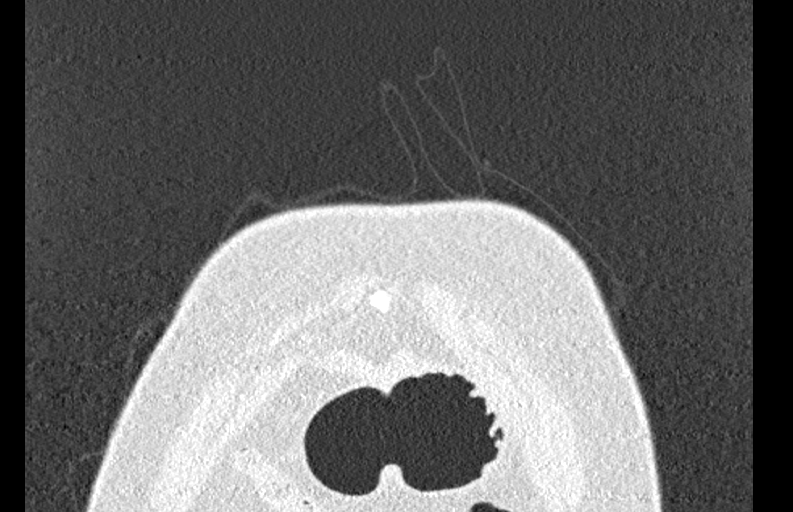
[im 191/478  lung]
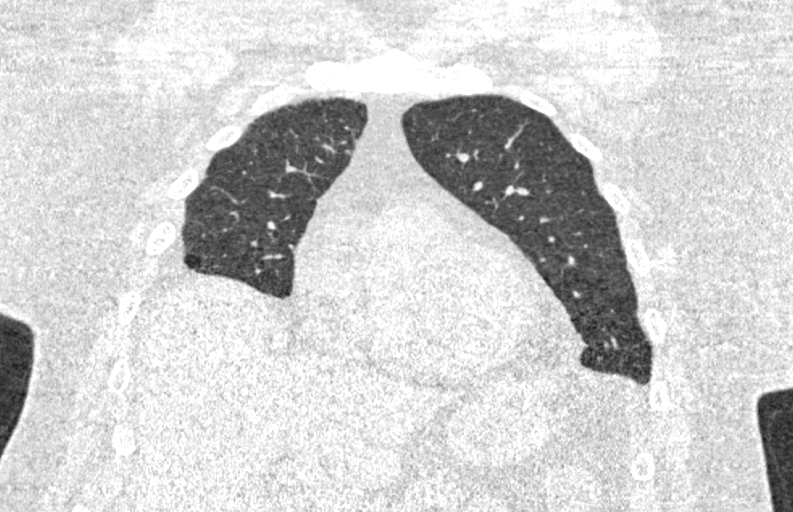
[im 287/478  lung]
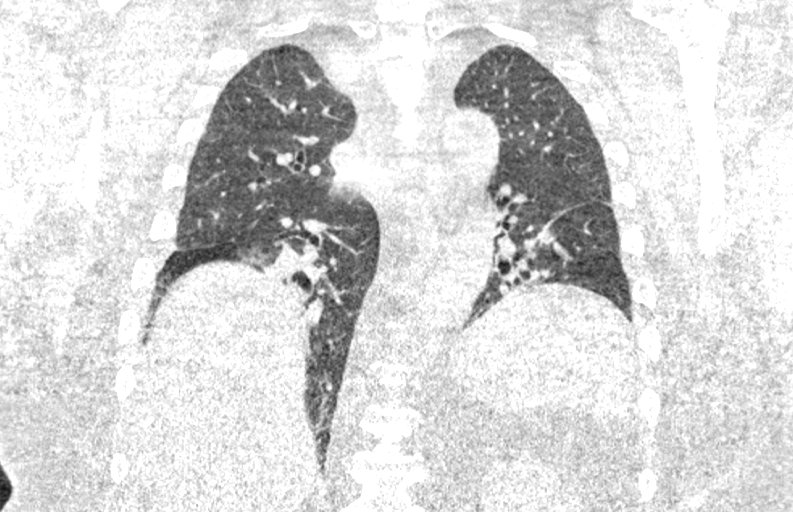

[Series 9: lung 1.00 br60 axial · axial · 0.94mm/px · z∈[-1192,-923]mm · 11 of 297 slices shown, 14 images]
[im 14/297  mediastinal]
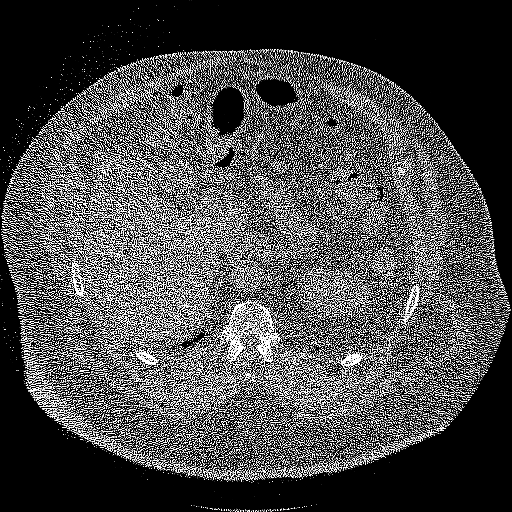
[im 14/297  lung]
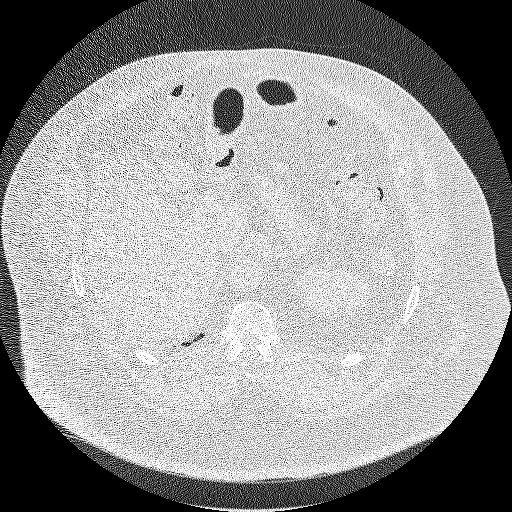
[im 41/297  lung]
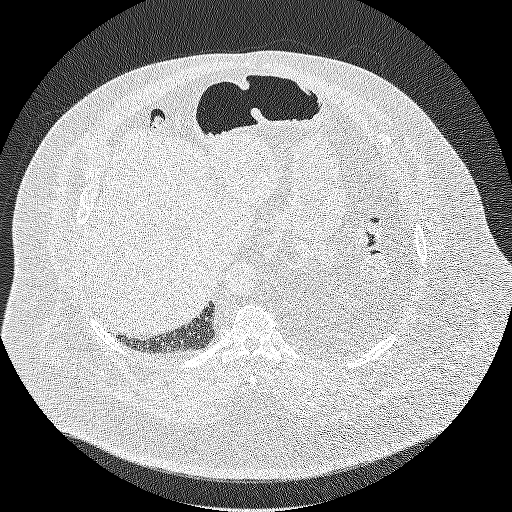
[im 68/297  lung]
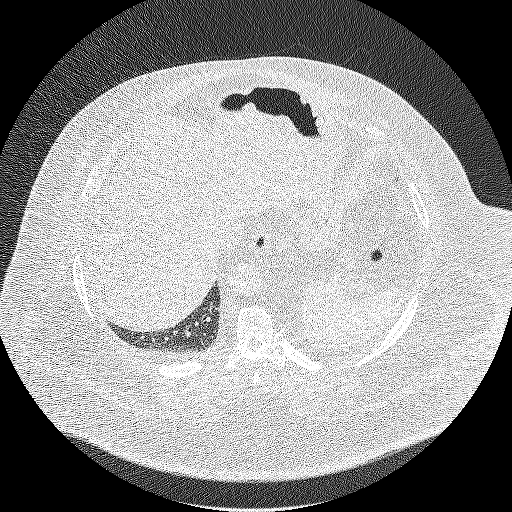
[im 95/297  lung]
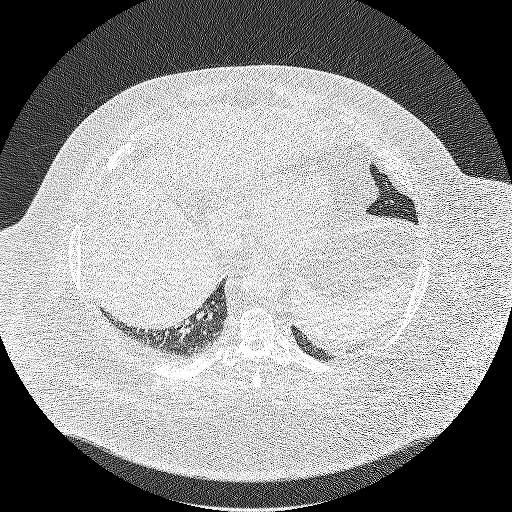
[im 122/297  mediastinal]
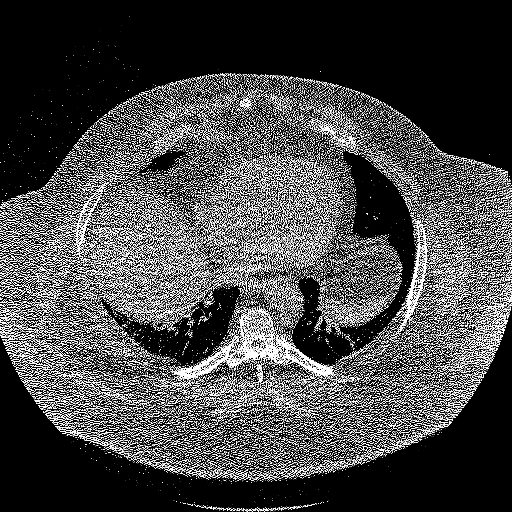
[im 122/297  lung]
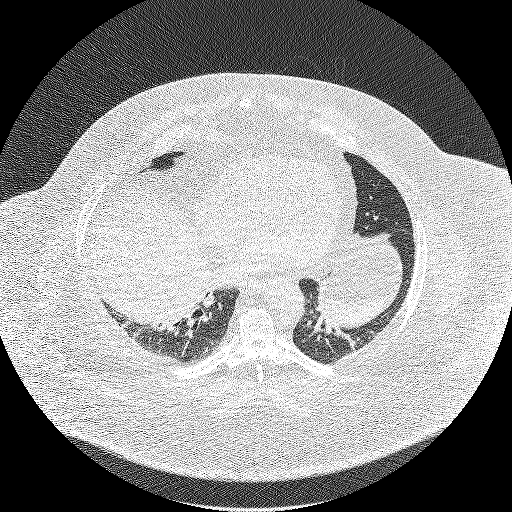
[im 149/297  lung]
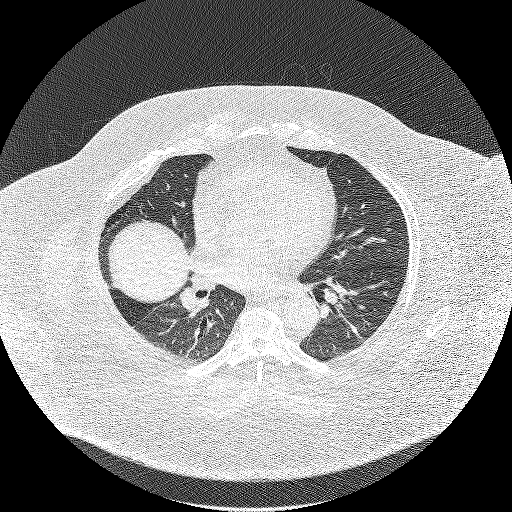
[im 175/297  lung]
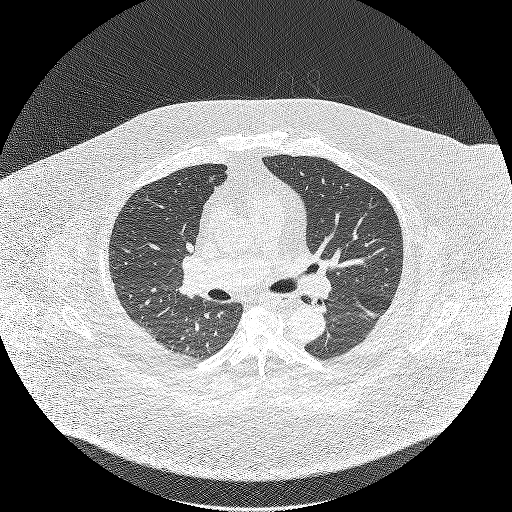
[im 202/297  lung]
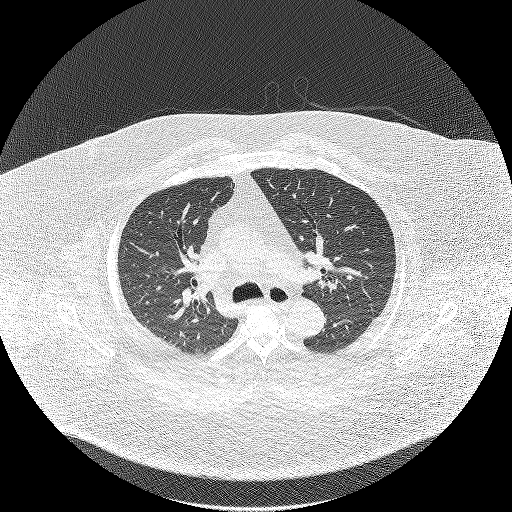
[im 229/297  mediastinal]
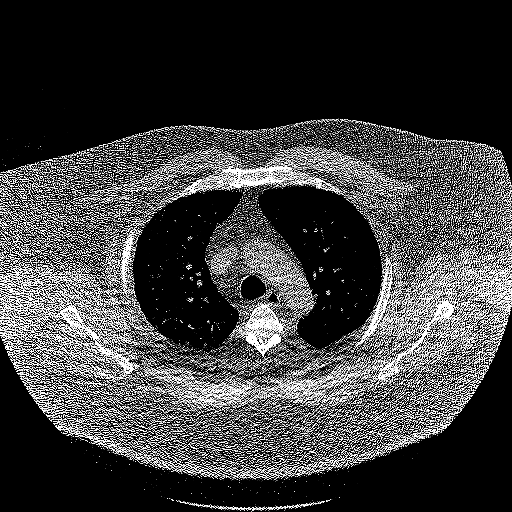
[im 229/297  lung]
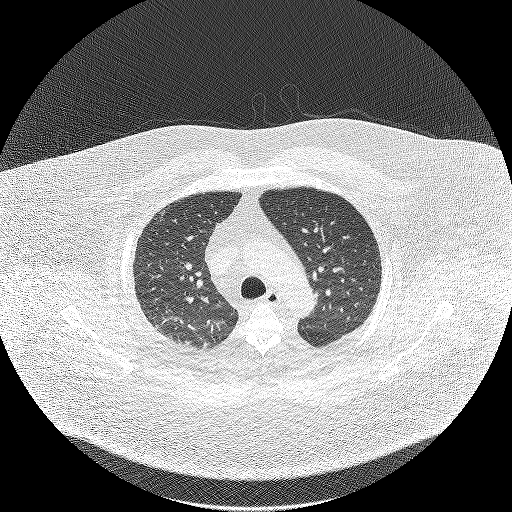
[im 256/297  lung]
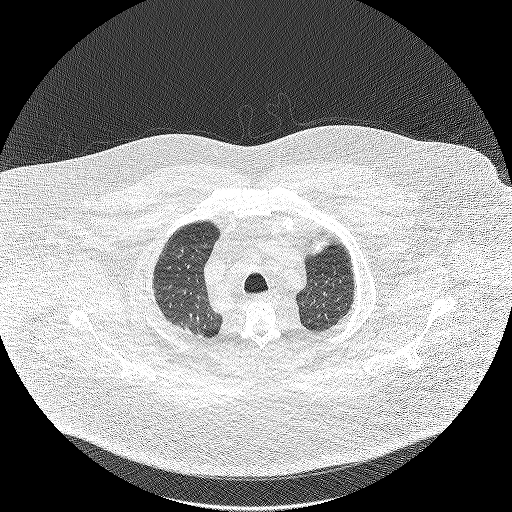
[im 283/297  lung]
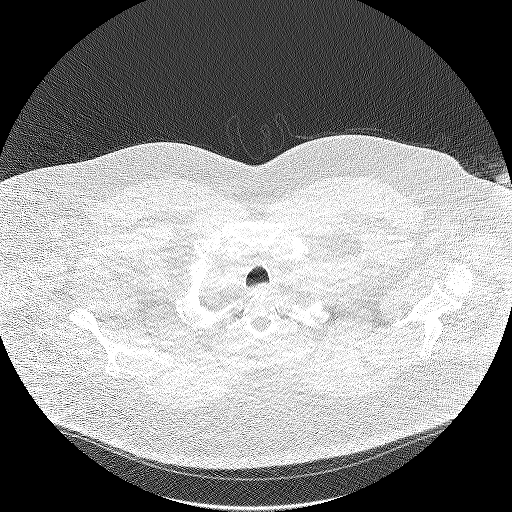

[14 of 40 positions shown; findings below may reference images not displayed]

FINDINGS: Cardiovascular: Mild limitations secondary to patient body habitus.
Bovine arch. Normal aortic caliber. Mild cardiomegaly, without
pericardial effusion.

Mediastinum/Nodes: No mediastinal or definite hilar adenopathy,
given limitations of unenhanced CT.

Lungs/Pleura: No pleural fluid.  Left base scarring.

Upper Abdomen: Probable hepatic steatosis. No gross upper abdominal
abnormality identified.

Musculoskeletal: No acute osseous abnormality.
IMPRESSION: 1. Lung-RADS 1, negative. Continue annual screening with low-dose
chest CT without contrast in 12 months.
2. Probable hepatic steatosis.
3. Decreased sensitivity and specificity exam due to technique
related factors, as described above.

## 2024-02-19 ENCOUNTER — Other Ambulatory Visit: Payer: Self-pay | Admitting: Nurse Practitioner

## 2024-02-19 DIAGNOSIS — E1169 Type 2 diabetes mellitus with other specified complication: Secondary | ICD-10-CM

## 2024-02-29 ENCOUNTER — Other Ambulatory Visit: Payer: Self-pay | Admitting: Nurse Practitioner

## 2024-02-29 DIAGNOSIS — M25551 Pain in right hip: Secondary | ICD-10-CM

## 2024-02-29 DIAGNOSIS — E1159 Type 2 diabetes mellitus with other circulatory complications: Secondary | ICD-10-CM

## 2024-02-29 DIAGNOSIS — M48061 Spinal stenosis, lumbar region without neurogenic claudication: Secondary | ICD-10-CM

## 2024-02-29 DIAGNOSIS — I10 Essential (primary) hypertension: Secondary | ICD-10-CM

## 2024-02-29 DIAGNOSIS — F3342 Major depressive disorder, recurrent, in full remission: Secondary | ICD-10-CM

## 2024-03-06 ENCOUNTER — Other Ambulatory Visit: Payer: Self-pay | Admitting: Internal Medicine

## 2024-03-08 ENCOUNTER — Encounter: Payer: Self-pay | Admitting: Nurse Practitioner

## 2024-03-08 ENCOUNTER — Ambulatory Visit: Admitting: Nurse Practitioner

## 2024-03-08 VITALS — BP 112/64 | HR 84 | Temp 99.2°F | Ht 70.0 in | Wt 346.6 lb

## 2024-03-08 DIAGNOSIS — Z23 Encounter for immunization: Secondary | ICD-10-CM

## 2024-03-08 DIAGNOSIS — I10 Essential (primary) hypertension: Secondary | ICD-10-CM

## 2024-03-08 DIAGNOSIS — R768 Other specified abnormal immunological findings in serum: Secondary | ICD-10-CM | POA: Diagnosis not present

## 2024-03-08 DIAGNOSIS — E1121 Type 2 diabetes mellitus with diabetic nephropathy: Secondary | ICD-10-CM

## 2024-03-08 DIAGNOSIS — L304 Erythema intertrigo: Secondary | ICD-10-CM

## 2024-03-08 DIAGNOSIS — Z8261 Family history of arthritis: Secondary | ICD-10-CM

## 2024-03-08 DIAGNOSIS — E785 Hyperlipidemia, unspecified: Secondary | ICD-10-CM

## 2024-03-08 DIAGNOSIS — E1169 Type 2 diabetes mellitus with other specified complication: Secondary | ICD-10-CM | POA: Diagnosis not present

## 2024-03-08 DIAGNOSIS — H469 Unspecified optic neuritis: Secondary | ICD-10-CM

## 2024-03-08 MED ORDER — NYSTATIN 100000 UNIT/GM EX POWD: 1.0000 | Freq: Three times a day (TID) | CUTANEOUS | 5 refills | Status: AC

## 2024-03-08 NOTE — Assessment & Plan Note (Signed)
 BP at goal No adverse effects with coreg , benicar /hydrochlorothiazide , and spironolactone . BP Readings from Last 3 Encounters:  03/08/24 112/64  01/29/24 108/72  01/19/24 122/70    Maintain med doses Wait for lab results from nephrology

## 2024-03-08 NOTE — Progress Notes (Signed)
 Established Patient Visit  Patient: Brent Wallace   DOB: [birth year]   63 y.o. Male  MRN: [id number] Visit Date: 03/08/2024  Subjective:    Chief Complaint  Patient presents with   Follow-up    FASTING 6 month follow up for DM, HTN and Hyperlipidemia  Wants Flu vaccine  Requested Eye Exam    Rash This is a recurrent problem. The current episode started more than 1 month ago. The problem has been waxing and waning since onset. Location: flank folds. The rash is characterized by redness and itchiness. He was exposed to nothing. Pertinent negatives include no fatigue or fever. Treatments tried: antifungal cream. The treatment provided mild relief. His past medical history is significant for eczema. There is no history of allergies, asthma or varicella.   Diabetic nephropathy associated with type 2 diabetes mellitus (HCC) Followed by [facility]- Dr. Tobie He has an appointment on Monday Will get BMP and urine test completed then  Optic neuritis Diagnosed in 2007 after acute loss in vision in right eye Reviewed MRI of brain and optic nerve. He was seen by Dr. [facility] in 2016-do not access to notes Currently under the care of [facility]. An updated eye exam report requested Positive ANA per notes from [facility] on 11/2023. Fhx of Rheumatoid arthritis-father.  Entered referral to rheumatology.  Hypertension BP at goal No adverse effects with coreg , benicar /hydrochlorothiazide , and spironolactone . BP Readings from Last 3 Encounters:  03/08/24 112/64  01/29/24 108/72  01/19/24 122/70    Maintain med doses Wait for lab results from nephrology  Wt Readings from Last 3 Encounters:  03/08/24 (!) 346 lb 9.6 oz (157.2 kg)  01/29/24 (!) 344 lb 3.2 oz (156.1 kg)  01/19/24 (!) 356 lb 9.6 oz (161.8 kg)    Reviewed medical, surgical, and social history today  Medications: Outpatient Medications Prior  to Visit  Medication Sig   acetaminophen  (TYLENOL ) 500 MG tablet Take 1,000 mg by mouth every 6 (six) hours as needed for mild pain (pain score 1-3) or moderate pain (pain score 4-6).   atorvastatin  (LIPITOR) 20 MG tablet TAKE 1 TABLET BY MOUTH THREE TIMES A WEEK.   carvedilol  (COREG ) 3.125 MG tablet TAKE 1 TABLET BY MOUTH TWICE A DAY WITH FOOD   cholecalciferol (VITAMIN D) 1000 units tablet Take 2,000 Units by mouth daily.   Continuous Glucose Sensor (FREESTYLE LIBRE 3 PLUS SENSOR) MISC Inject 1 Device into the skin continuous. Change every 15 days   DULoxetine  (CYMBALTA ) 60 MG capsule TAKE 1 CAPSULE BY MOUTH EVERY DAY   empagliflozin  (JARDIANCE ) 25 MG TABS tablet Take 1 tablet (25 mg total) by mouth daily before breakfast.   insulin  glargine (LANTUS  SOLOSTAR) 100 UNIT/ML Solostar Pen Inject 24 Units into the skin at bedtime.   Insulin  Lispro-aabc (LYUMJEV  KWIKPEN) 100 UNIT/ML KwikPen INJECT 20-30 UNITS INTO SKIN 3 TIMES A DAY BEFORE MEALS   Insulin  Pen Needle (BD PEN NEEDLE NANO 2ND GEN) 32G X 4 MM MISC USE AS DIRECTED EVERY DAY   metFORMIN  (GLUCOPHAGE -XR) 500 MG 24 hr tablet TAKE 1 TABLET BY MOUTH TWICE A DAY   olmesartan -hydrochlorothiazide  (BENICAR  HCT) 40-12.5 MG tablet TAKE 1 TABLET BY MOUTH EVERY DAY   OneTouch Delica Lancets 33G MISC Use 2x a day   ONETOUCH ULTRA test strip 1 EACH BY OTHER ROUTE IN THE MORNING, AT NOON, AND AT BEDTIME. ONE TOUCH ULTRA  OVER THE COUNTER MEDICATION Delta * gummy at bedtime with a small amount of THC   Semaglutide , 2 MG/DOSE, (OZEMPIC , 2 MG/DOSE,) 8 MG/3ML SOPN INJECT 2 MG INTO THE SKIN ONCE A WEEK.   sildenafil  (VIAGRA ) 100 MG tablet Take 0.5-1 tab 30 minutes prior to sexual activity as needed.   spironolactone  (ALDACTONE ) 25 MG tablet TAKE 1 TABLET (25 MG TOTAL) BY MOUTH DAILY.   [DISCONTINUED] Sod Picosulfate-Mag Ox-Cit Acd (CLENPIQ ) 10-3.5-12 MG-GM -GM/160ML SOLN Take 1 kit by mouth as directed. (Patient not taking: Reported on 03/08/2024)   No  facility-administered medications prior to visit.   Reviewed past medical and social history.   ROS per HPI above      Objective:  BP 112/64 (BP Location: Right Arm, Patient Position: Sitting, Cuff Size: Large)   Pulse 84   Temp 99.2 F (37.3 C) (Oral)   Ht 5' 10 (1.778 m)   Wt (!) 346 lb 9.6 oz (157.2 kg)   SpO2 93%   BMI 49.73 kg/m      Physical Exam Vitals and nursing note reviewed.  Cardiovascular:     Rate and Rhythm: Normal rate and regular rhythm.     Pulses: Normal pulses.     Heart sounds: Normal heart sounds.  Pulmonary:     Effort: Pulmonary effort is normal.     Breath sounds: Normal breath sounds.  Musculoskeletal:     Right lower leg: No edema.     Left lower leg: No edema.  Neurological:     Mental Status: He is alert and oriented to person, place, and time.     No results found for any visits on 03/08/24.    Assessment & Plan:    Problem List Items Addressed This Visit     Diabetic nephropathy associated with type 2 diabetes mellitus (HCC)   Followed by [facility]- Dr. Tobie He has an appointment on Monday Will get BMP and urine test completed then      Family history of rheumatoid arthritis   Relevant Orders   Ambulatory referral to Rheumatology   Hyperlipidemia associated with type 2 diabetes mellitus (HCC)   Hypertension - Primary   BP at goal No adverse effects with coreg , benicar /hydrochlorothiazide , and spironolactone . BP Readings from Last 3 Encounters:  03/08/24 112/64  01/29/24 108/72  01/19/24 122/70    Maintain med doses Wait for lab results from nephrology      Optic neuritis (Chronic)   Diagnosed in 2007 after acute loss in vision in right eye Reviewed MRI of brain and optic nerve. He was seen by Dr. [facility] in 2016-do not access to notes Currently under the care of [facility]. An updated eye exam report requested Positive ANA per notes from [facility] on 11/2023. Fhx of Rheumatoid arthritis-father.  Entered referral to rheumatology.      Relevant Orders   Ambulatory referral to Rheumatology   Positive ANA (antinuclear antibody)   Relevant Orders   Ambulatory referral to Rheumatology   Other Visit Diagnoses       Need for influenza vaccination       Relevant Orders   Flu vaccine HIGH DOSE PF(Fluzone Trivalent) (Completed)      Return in about 6 months (around 09/05/2024) for DM, HTN, hyperlipidemia (fasting).     Roselie Mood, NP

## 2024-03-08 NOTE — Assessment & Plan Note (Addendum)
 Diagnosed in 2007 after acute loss in vision in right eye Reviewed MRI of brain and optic nerve. He was seen by Dr. [facility] in 2016-do not access to notes Currently under the care of [facility]. An updated eye exam report requested Positive ANA per notes from [facility] on 11/2023. Fhx of Rheumatoid arthritis-father.  Entered referral to rheumatology.

## 2024-03-08 NOTE — Assessment & Plan Note (Signed)
 Followed by [facility]- Dr. Tobie He has an appointment on Monday Will get BMP and urine test completed then

## 2024-03-12 ENCOUNTER — Telehealth: Payer: Self-pay

## 2024-03-12 NOTE — Telephone Encounter (Signed)
 Patient wife contacted the office back about the patient being scheduled off of the cancellation list. Please advise.

## 2024-03-15 DIAGNOSIS — I129 Hypertensive chronic kidney disease with stage 1 through stage 4 chronic kidney disease, or unspecified chronic kidney disease: Secondary | ICD-10-CM | POA: Diagnosis not present

## 2024-03-15 DIAGNOSIS — N182 Chronic kidney disease, stage 2 (mild): Secondary | ICD-10-CM | POA: Diagnosis not present

## 2024-03-15 DIAGNOSIS — R809 Proteinuria, unspecified: Secondary | ICD-10-CM | POA: Diagnosis not present

## 2024-04-12 ENCOUNTER — Encounter: Payer: Self-pay | Admitting: Radiology

## 2024-05-06 ENCOUNTER — Other Ambulatory Visit: Payer: Self-pay | Admitting: Internal Medicine

## 2024-05-06 DIAGNOSIS — E1159 Type 2 diabetes mellitus with other circulatory complications: Secondary | ICD-10-CM

## 2024-05-24 ENCOUNTER — Ambulatory Visit: Admitting: Internal Medicine

## 2024-05-24 ENCOUNTER — Encounter: Payer: Self-pay | Admitting: Internal Medicine

## 2024-05-24 VITALS — BP 114/64 | Ht 70.0 in | Wt 351.0 lb

## 2024-05-24 DIAGNOSIS — E785 Hyperlipidemia, unspecified: Secondary | ICD-10-CM

## 2024-05-24 DIAGNOSIS — N521 Erectile dysfunction due to diseases classified elsewhere: Secondary | ICD-10-CM

## 2024-05-24 DIAGNOSIS — Z7985 Long-term (current) use of injectable non-insulin antidiabetic drugs: Secondary | ICD-10-CM | POA: Diagnosis not present

## 2024-05-24 DIAGNOSIS — Z794 Long term (current) use of insulin: Secondary | ICD-10-CM

## 2024-05-24 DIAGNOSIS — E1159 Type 2 diabetes mellitus with other circulatory complications: Secondary | ICD-10-CM | POA: Diagnosis not present

## 2024-05-24 DIAGNOSIS — Z6841 Body Mass Index (BMI) 40.0 and over, adult: Secondary | ICD-10-CM | POA: Diagnosis not present

## 2024-05-24 DIAGNOSIS — E66813 Obesity, class 3: Secondary | ICD-10-CM

## 2024-05-24 DIAGNOSIS — E1169 Type 2 diabetes mellitus with other specified complication: Secondary | ICD-10-CM | POA: Diagnosis not present

## 2024-05-24 DIAGNOSIS — Z7984 Long term (current) use of oral hypoglycemic drugs: Secondary | ICD-10-CM

## 2024-05-24 LAB — POCT GLYCOSYLATED HEMOGLOBIN (HGB A1C): Hemoglobin A1C: 7.1 % — AB (ref 4.0–5.6)

## 2024-05-24 MED ORDER — OZEMPIC (2 MG/DOSE) 8 MG/3ML ~~LOC~~ SOPN: PEN_INJECTOR | SUBCUTANEOUS | 3 refills | Status: AC

## 2024-05-24 MED ORDER — BD PEN NEEDLE NANO 2ND GEN 32G X 4 MM MISC: 3 refills | Status: AC
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
## 2024-05-24 NOTE — Patient Instructions (Addendum)
 Please continue: - Metformin  XR 500 mg 2x a day, with meals - Jardiance  25 mg before b'fast - Ozempic  2 mg weekly in a.m.  Try to change: - Lyumjev  15 (20) units before meals  - Lantus  22 units at night    Please return in 3-4 months.

## 2024-05-24 NOTE — Addendum Note (Signed)
 Addended by: Tage Feggins M on: 05/24/2024 04:27 PM   Modules accepted: Orders
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
## 2024-06-14 ENCOUNTER — Ambulatory Visit: Attending: Rheumatology | Admitting: Rheumatology

## 2024-06-14 ENCOUNTER — Encounter: Payer: Self-pay | Admitting: Rheumatology

## 2024-06-14 VITALS — BP 124/81 | HR 78 | Temp 98.4°F | Resp 18 | Ht 69.0 in | Wt 351.4 lb

## 2024-06-14 DIAGNOSIS — M5416 Radiculopathy, lumbar region: Secondary | ICD-10-CM | POA: Diagnosis present

## 2024-06-14 DIAGNOSIS — Z8711 Personal history of peptic ulcer disease: Secondary | ICD-10-CM | POA: Diagnosis not present

## 2024-06-14 DIAGNOSIS — G4733 Obstructive sleep apnea (adult) (pediatric): Secondary | ICD-10-CM | POA: Diagnosis present

## 2024-06-14 DIAGNOSIS — Z8719 Personal history of other diseases of the digestive system: Secondary | ICD-10-CM

## 2024-06-14 DIAGNOSIS — E66813 Obesity, class 3: Secondary | ICD-10-CM

## 2024-06-14 DIAGNOSIS — Z6841 Body Mass Index (BMI) 40.0 and over, adult: Secondary | ICD-10-CM

## 2024-06-14 DIAGNOSIS — R5383 Other fatigue: Secondary | ICD-10-CM

## 2024-06-14 DIAGNOSIS — F909 Attention-deficit hyperactivity disorder, unspecified type: Secondary | ICD-10-CM | POA: Diagnosis not present

## 2024-06-14 DIAGNOSIS — D122 Benign neoplasm of ascending colon: Secondary | ICD-10-CM | POA: Diagnosis not present

## 2024-06-14 DIAGNOSIS — Z87891 Personal history of nicotine dependence: Secondary | ICD-10-CM

## 2024-06-14 DIAGNOSIS — Z8261 Family history of arthritis: Secondary | ICD-10-CM | POA: Diagnosis present

## 2024-06-14 DIAGNOSIS — E785 Hyperlipidemia, unspecified: Secondary | ICD-10-CM | POA: Diagnosis present

## 2024-06-14 DIAGNOSIS — F339 Major depressive disorder, recurrent, unspecified: Secondary | ICD-10-CM | POA: Diagnosis present

## 2024-06-14 DIAGNOSIS — M160 Bilateral primary osteoarthritis of hip: Secondary | ICD-10-CM

## 2024-06-14 DIAGNOSIS — R7689 Other specified abnormal immunological findings in serum: Secondary | ICD-10-CM

## 2024-06-14 DIAGNOSIS — I10 Essential (primary) hypertension: Secondary | ICD-10-CM

## 2024-06-14 DIAGNOSIS — M48061 Spinal stenosis, lumbar region without neurogenic claudication: Secondary | ICD-10-CM

## 2024-06-14 DIAGNOSIS — E1169 Type 2 diabetes mellitus with other specified complication: Secondary | ICD-10-CM | POA: Diagnosis not present

## 2024-06-14 DIAGNOSIS — E1121 Type 2 diabetes mellitus with diabetic nephropathy: Secondary | ICD-10-CM | POA: Diagnosis not present

## 2024-06-14 DIAGNOSIS — H469 Unspecified optic neuritis: Secondary | ICD-10-CM

## 2024-06-16 ENCOUNTER — Ambulatory Visit: Payer: Self-pay | Admitting: Rheumatology

## 2024-06-16 LAB — ANTI-NUCLEAR AB-TITER (ANA TITER): ANA Titer 1: 1:40 {titer} — ABNORMAL HIGH

## 2024-06-16 LAB — CARDIOLIPIN ANTIBODIES, IGG, IGM, IGA
Anticardiolipin IgA: <2 [APL'U]/mL
Anticardiolipin IgG: <2 [GPL'U]/mL
Anticardiolipin IgM: <2 [MPL'U]/mL

## 2024-06-16 LAB — CBC WITH DIFFERENTIAL/PLATELET
Absolute Lymphocytes: 2089 {cells}/uL (ref 850–3900)
Absolute Monocytes: 413 {cells}/uL (ref 200–950)
Basophils Absolute: 71 {cells}/uL (ref 0–200)
Basophils Relative: 1.2 %
Eosinophils Absolute: 171 {cells}/uL (ref 15–500)
Eosinophils Relative: 2.9 %
HCT: 52.3 % — ABNORMAL HIGH (ref 39.4–51.1)
Hemoglobin: 16.9 g/dL (ref 13.2–17.1)
MCH: 29.8 pg (ref 27.0–33.0)
MCHC: 32.3 g/dL (ref 31.6–35.4)
MCV: 92.2 fL (ref 81.4–101.7)
MPV: 10.6 fL (ref 7.5–12.5)
Monocytes Relative: 7 %
Neutro Abs: 3157 {cells}/uL (ref 1500–7800)
Neutrophils Relative %: 53.5 %
Platelets: 224 Thousand/uL (ref 140–400)
RBC: 5.67 Million/uL (ref 4.20–5.80)
RDW: 13.6 % (ref 11.0–15.0)
Total Lymphocyte: 35.4 %
WBC: 5.9 Thousand/uL (ref 3.8–10.8)

## 2024-06-16 LAB — RHEUMATOID FACTOR: Rheumatoid fact SerPl-aCnc: <10 [IU]/mL

## 2024-06-16 LAB — THYROID PEROXIDASE ANTIBODY: Thyroperoxidase Ab SerPl-aCnc: <1 [IU]/mL

## 2024-06-16 LAB — RNP ANTIBODY: Ribonucleic Protein(ENA) Antibody, IgG: <1 AI

## 2024-06-16 LAB — BETA-2 GLYCOPROTEIN ANTIBODIES
Beta-2 Glyco 1 IgA: <2 U/mL
Beta-2 Glyco 1 IgM: <2 U/mL
Beta-2 Glyco I IgG: <2 U/mL

## 2024-06-16 LAB — SEDIMENTATION RATE: Sed Rate: 6 mm/h (ref 0–20)

## 2024-06-16 LAB — CYCLIC CITRUL PEPTIDE ANTIBODY, IGG: Cyclic Citrullin Peptide Ab: <16 U

## 2024-06-16 LAB — SJOGRENS SYNDROME-A EXTRACTABLE NUCLEAR ANTIBODY: SSA (Ro) (ENA) Antibody, IgG: <1 AI

## 2024-06-16 LAB — COMPREHENSIVE METABOLIC PANEL WITH GFR
AG Ratio: 1.2 (calc) (ref 1.0–2.5)
ALT: 17 U/L (ref 9–46)
AST: 17 U/L (ref 10–35)
Albumin: 4.3 g/dL (ref 3.6–5.1)
Alkaline phosphatase (APISO): 66 U/L (ref 35–144)
BUN: 25 mg/dL (ref 7–25)
CO2: 26 mmol/L (ref 20–32)
Calcium: 9.5 mg/dL (ref 8.6–10.3)
Chloride: 100 mmol/L (ref 98–110)
Creat: 1.19 mg/dL (ref 0.70–1.35)
Globulin: 3.5 g/dL (ref 1.9–3.7)
Glucose, Bld: 148 mg/dL — ABNORMAL HIGH (ref 65–99)
Potassium: 4.3 mmol/L (ref 3.5–5.3)
Sodium: 139 mmol/L (ref 135–146)
Total Bilirubin: 0.4 mg/dL (ref 0.2–1.2)
Total Protein: 7.8 g/dL (ref 6.1–8.1)
eGFR: 68 mL/min/1.73m2

## 2024-06-16 LAB — C3 AND C4
C3 Complement: 140 mg/dL (ref 82–185)
C4 Complement: 29 mg/dL (ref 15–53)

## 2024-06-16 LAB — ANA: Anti Nuclear Antibody (ANA): POSITIVE — AB

## 2024-06-16 LAB — TSH: TSH: 1.08 m[IU]/L (ref 0.40–4.50)

## 2024-06-16 LAB — ANTI-SCLERODERMA ANTIBODY: Scleroderma (Scl-70) (ENA) Antibody, IgG: <1 AI

## 2024-06-16 LAB — ANTI-SMITH ANTIBODY: ENA SM Ab Ser-aCnc: <1 AI

## 2024-06-16 LAB — SJOGRENS SYNDROME-B EXTRACTABLE NUCLEAR ANTIBODY: SSB (La) (ENA) Antibody, IgG: <1 AI

## 2024-06-16 LAB — THYROGLOBULIN ANTIBODY: Thyroglobulin Ab: <1 [IU]/mL

## 2024-06-16 LAB — ANTI-DNA ANTIBODY, DOUBLE-STRANDED: ds DNA Ab: <1 [IU]/mL

## 2024-06-16 NOTE — Progress Notes (Signed)
 CBC normal, CMP shows elevated glucose, ENA panel negative, complements normal, beta-2  GP 1 negative, anticardiolipin negative.  Rheumatoid factor negative, anti-CCP negative, sed rate normal, TSH normal.  Thyroid  antibodies and ANA titer pending.

## 2024-06-17 NOTE — Progress Notes (Signed)
 I will discuss results at the follow-up visit.

## 2024-06-17 NOTE — Progress Notes (Signed)
 ANA is low titer positive and not significant.  Thyroid  antibodies are negative.

## 2024-08-09 ENCOUNTER — Encounter: Admitting: Rheumatology

## 2024-09-06 ENCOUNTER — Ambulatory Visit: Admitting: Rheumatology

## 2024-09-06 ENCOUNTER — Ambulatory Visit: Admitting: Nurse Practitioner

## 2024-09-27 ENCOUNTER — Ambulatory Visit: Admitting: Internal Medicine
